# Patient Record
Sex: Female | Born: 1950 | Race: White | Hispanic: No | Marital: Married | Smoking: Never smoker
Health system: Southern US, Community
[De-identification: ages and names within clinical notes are randomized; demographics above are authoritative.]

## PROBLEM LIST (undated history)

## (undated) DIAGNOSIS — E785 Hyperlipidemia, unspecified: Secondary | ICD-10-CM

## (undated) DIAGNOSIS — T7840XA Allergy, unspecified, initial encounter: Secondary | ICD-10-CM

## (undated) DIAGNOSIS — K5790 Diverticulosis of intestine, part unspecified, without perforation or abscess without bleeding: Secondary | ICD-10-CM

## (undated) DIAGNOSIS — K449 Diaphragmatic hernia without obstruction or gangrene: Secondary | ICD-10-CM

## (undated) DIAGNOSIS — S2239XA Fracture of one rib, unspecified side, initial encounter for closed fracture: Secondary | ICD-10-CM

## (undated) DIAGNOSIS — M1990 Unspecified osteoarthritis, unspecified site: Secondary | ICD-10-CM

## (undated) DIAGNOSIS — G8929 Other chronic pain: Secondary | ICD-10-CM

## (undated) DIAGNOSIS — M76899 Other specified enthesopathies of unspecified lower limb, excluding foot: Secondary | ICD-10-CM

## (undated) DIAGNOSIS — Z1211 Encounter for screening for malignant neoplasm of colon: Secondary | ICD-10-CM

## (undated) DIAGNOSIS — F32A Depression, unspecified: Secondary | ICD-10-CM

## (undated) DIAGNOSIS — N952 Postmenopausal atrophic vaginitis: Secondary | ICD-10-CM

## (undated) DIAGNOSIS — D509 Iron deficiency anemia, unspecified: Secondary | ICD-10-CM

## (undated) DIAGNOSIS — K219 Gastro-esophageal reflux disease without esophagitis: Secondary | ICD-10-CM

## (undated) DIAGNOSIS — H9319 Tinnitus, unspecified ear: Secondary | ICD-10-CM

## (undated) DIAGNOSIS — F419 Anxiety disorder, unspecified: Secondary | ICD-10-CM

## (undated) DIAGNOSIS — N83209 Unspecified ovarian cyst, unspecified side: Secondary | ICD-10-CM

## (undated) DIAGNOSIS — S42309A Unspecified fracture of shaft of humerus, unspecified arm, initial encounter for closed fracture: Secondary | ICD-10-CM

## (undated) DIAGNOSIS — K1121 Acute sialoadenitis: Secondary | ICD-10-CM

## (undated) DIAGNOSIS — M810 Age-related osteoporosis without current pathological fracture: Secondary | ICD-10-CM

## (undated) DIAGNOSIS — S42209A Unspecified fracture of upper end of unspecified humerus, initial encounter for closed fracture: Secondary | ICD-10-CM

## (undated) DIAGNOSIS — R519 Headache, unspecified: Secondary | ICD-10-CM

## (undated) DIAGNOSIS — N870 Mild cervical dysplasia: Secondary | ICD-10-CM

## (undated) DIAGNOSIS — R51 Headache: Secondary | ICD-10-CM

## (undated) DIAGNOSIS — F329 Major depressive disorder, single episode, unspecified: Secondary | ICD-10-CM

## (undated) DIAGNOSIS — S83249A Other tear of medial meniscus, current injury, unspecified knee, initial encounter: Secondary | ICD-10-CM

## (undated) DIAGNOSIS — Z124 Encounter for screening for malignant neoplasm of cervix: Secondary | ICD-10-CM

## (undated) DIAGNOSIS — R7303 Prediabetes: Secondary | ICD-10-CM

## (undated) HISTORY — DX: Diaphragmatic hernia without obstruction or gangrene: K44.9

## (undated) HISTORY — DX: Acute sialoadenitis: K11.21

## (undated) HISTORY — PX: COLONOSCOPY: SHX174

## (undated) HISTORY — DX: Postmenopausal atrophic vaginitis: N95.2

## (undated) HISTORY — DX: Unspecified fracture of upper end of unspecified humerus, initial encounter for closed fracture: S42.209A

## (undated) HISTORY — DX: Anxiety disorder, unspecified: F41.9

## (undated) HISTORY — PX: OTHER SURGICAL HISTORY: SHX169

## (undated) HISTORY — DX: Mild cervical dysplasia: N87.0

## (undated) HISTORY — DX: Age-related osteoporosis without current pathological fracture: M81.0

## (undated) HISTORY — DX: Gastro-esophageal reflux disease without esophagitis: K21.9

## (undated) HISTORY — DX: Encounter for screening for malignant neoplasm of colon: Z12.11

## (undated) HISTORY — DX: Iron deficiency anemia, unspecified: D50.9

## (undated) HISTORY — DX: Allergy, unspecified, initial encounter: T78.40XA

## (undated) HISTORY — DX: Hyperlipidemia, unspecified: E78.5

## (undated) HISTORY — DX: Major depressive disorder, single episode, unspecified: F32.9

## (undated) HISTORY — DX: Prediabetes: R73.03

## (undated) HISTORY — DX: Tinnitus, unspecified ear: H93.19

## (undated) HISTORY — DX: Fracture of one rib, unspecified side, initial encounter for closed fracture: S22.39XA

## (undated) HISTORY — DX: Other specified enthesopathies of unspecified lower limb, excluding foot: M76.899

## (undated) HISTORY — DX: Other chronic pain: G89.29

## (undated) HISTORY — DX: Other tear of medial meniscus, current injury, unspecified knee, initial encounter: S83.249A

## (undated) HISTORY — DX: Unspecified ovarian cyst, unspecified side: N83.209

## (undated) HISTORY — DX: Unspecified osteoarthritis, unspecified site: M19.90

## (undated) HISTORY — DX: Encounter for screening for malignant neoplasm of cervix: Z12.4

## (undated) HISTORY — DX: Diverticulosis of intestine, part unspecified, without perforation or abscess without bleeding: K57.90

## (undated) HISTORY — PX: HYSTEROSCOPY: SHX211

## (undated) HISTORY — DX: Depression, unspecified: F32.A

## (undated) HISTORY — DX: Headache: R51

## (undated) HISTORY — PX: MOUTH SURGERY: SHX715

## (undated) HISTORY — DX: Unspecified fracture of shaft of humerus, unspecified arm, initial encounter for closed fracture: S42.309A

## (undated) HISTORY — DX: Headache, unspecified: R51.9

---
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
## 1985-02-12 HISTORY — PX: TUBAL LIGATION: SHX77

## 1987-02-13 HISTORY — PX: CHOLECYSTECTOMY: SHX55

## 1997-08-05 ENCOUNTER — Ambulatory Visit (HOSPITAL_COMMUNITY): Admission: RE | Admit: 1997-08-05 | Discharge: 1997-08-05 | Payer: Self-pay | Admitting: Obstetrics and Gynecology

## 1999-02-13 HISTORY — PX: CARPAL TUNNEL RELEASE: SHX101

## 1999-08-29 ENCOUNTER — Encounter (INDEPENDENT_AMBULATORY_CARE_PROVIDER_SITE_OTHER): Payer: Self-pay | Admitting: Specialist

## 1999-08-29 ENCOUNTER — Other Ambulatory Visit: Admission: RE | Admit: 1999-08-29 | Discharge: 1999-08-29 | Payer: Self-pay | Admitting: Obstetrics and Gynecology

## 1999-12-05 ENCOUNTER — Ambulatory Visit (HOSPITAL_BASED_OUTPATIENT_CLINIC_OR_DEPARTMENT_OTHER): Admission: RE | Admit: 1999-12-05 | Discharge: 1999-12-05 | Payer: Self-pay | Admitting: Orthopedic Surgery

## 2000-10-09 ENCOUNTER — Other Ambulatory Visit: Admission: RE | Admit: 2000-10-09 | Discharge: 2000-10-09 | Payer: Self-pay | Admitting: Obstetrics and Gynecology

## 2000-11-12 ENCOUNTER — Other Ambulatory Visit: Admission: RE | Admit: 2000-11-12 | Discharge: 2000-11-12 | Payer: Self-pay | Admitting: Obstetrics and Gynecology

## 2000-11-14 ENCOUNTER — Other Ambulatory Visit: Admission: RE | Admit: 2000-11-14 | Discharge: 2000-11-14 | Payer: Self-pay | Admitting: Obstetrics and Gynecology

## 2001-02-12 HISTORY — PX: ABDOMINAL SURGERY: SHX537

## 2001-02-12 HISTORY — PX: OOPHORECTOMY: SHX86

## 2001-03-20 ENCOUNTER — Encounter (INDEPENDENT_AMBULATORY_CARE_PROVIDER_SITE_OTHER): Payer: Self-pay | Admitting: Specialist

## 2001-03-21 ENCOUNTER — Inpatient Hospital Stay (HOSPITAL_COMMUNITY): Admission: RE | Admit: 2001-03-21 | Discharge: 2001-03-23 | Payer: Self-pay | Admitting: Obstetrics and Gynecology

## 2001-03-28 ENCOUNTER — Encounter: Payer: Self-pay | Admitting: *Deleted

## 2001-03-28 ENCOUNTER — Inpatient Hospital Stay (HOSPITAL_COMMUNITY): Admission: EM | Admit: 2001-03-28 | Discharge: 2001-04-02 | Payer: Self-pay | Admitting: Emergency Medicine

## 2001-03-29 ENCOUNTER — Encounter: Payer: Self-pay | Admitting: Emergency Medicine

## 2002-05-26 ENCOUNTER — Other Ambulatory Visit: Admission: RE | Admit: 2002-05-26 | Discharge: 2002-05-26 | Payer: Self-pay | Admitting: Obstetrics and Gynecology

## 2002-09-17 ENCOUNTER — Ambulatory Visit (HOSPITAL_BASED_OUTPATIENT_CLINIC_OR_DEPARTMENT_OTHER): Admission: RE | Admit: 2002-09-17 | Discharge: 2002-09-17 | Payer: Self-pay | Admitting: Orthopedic Surgery

## 2003-05-31 ENCOUNTER — Other Ambulatory Visit: Admission: RE | Admit: 2003-05-31 | Discharge: 2003-05-31 | Payer: Self-pay | Admitting: Obstetrics and Gynecology

## 2004-02-24 ENCOUNTER — Ambulatory Visit: Payer: Self-pay | Admitting: Internal Medicine

## 2004-03-03 ENCOUNTER — Ambulatory Visit: Payer: Self-pay | Admitting: Internal Medicine

## 2004-03-08 ENCOUNTER — Ambulatory Visit: Payer: Self-pay | Admitting: Internal Medicine

## 2004-05-31 ENCOUNTER — Other Ambulatory Visit: Admission: RE | Admit: 2004-05-31 | Discharge: 2004-05-31 | Payer: Self-pay | Admitting: Obstetrics and Gynecology

## 2004-11-30 ENCOUNTER — Other Ambulatory Visit: Admission: RE | Admit: 2004-11-30 | Discharge: 2004-11-30 | Payer: Self-pay | Admitting: Obstetrics and Gynecology

## 2005-04-12 ENCOUNTER — Ambulatory Visit: Payer: Self-pay | Admitting: Internal Medicine

## 2005-06-04 ENCOUNTER — Other Ambulatory Visit: Admission: RE | Admit: 2005-06-04 | Discharge: 2005-06-04 | Payer: Self-pay | Admitting: Obstetrics and Gynecology

## 2005-11-14 ENCOUNTER — Other Ambulatory Visit: Admission: RE | Admit: 2005-11-14 | Discharge: 2005-11-14 | Payer: Self-pay | Admitting: Obstetrics and Gynecology

## 2006-01-01 ENCOUNTER — Ambulatory Visit: Payer: Self-pay | Admitting: Internal Medicine

## 2006-01-16 ENCOUNTER — Ambulatory Visit: Payer: Self-pay | Admitting: Internal Medicine

## 2006-01-16 LAB — CONVERTED CEMR LAB
ALT: 37 units/L (ref 0–40)
AST: 30 units/L (ref 0–37)
Albumin: 3.9 g/dL (ref 3.5–5.2)
Alkaline Phosphatase: 69 units/L (ref 39–117)
BUN: 15 mg/dL (ref 6–23)
CO2: 27 meq/L (ref 19–32)
Calcium: 9.4 mg/dL (ref 8.4–10.5)
Chloride: 106 meq/L (ref 96–112)
Chol/HDL Ratio, serum: 3.2
Cholesterol: 205 mg/dL (ref 0–200)
Creatinine, Ser: 1.1 mg/dL (ref 0.4–1.2)
GFR calc non Af Amer: 55 mL/min
Glomerular Filtration Rate, Af Am: 66 mL/min/{1.73_m2}
Glucose, Bld: 98 mg/dL (ref 70–99)
HDL: 63.9 mg/dL (ref >39.0)
LDL DIRECT: 135.2 mg/dL
Potassium: 4 meq/L (ref 3.5–5.1)
Sodium: 142 meq/L (ref 135–145)
Total Bilirubin: 0.6 mg/dL (ref 0.3–1.2)
Total Protein: 6.9 g/dL (ref 6.0–8.3)
Triglyceride fasting, serum: 88 mg/dL (ref 0–149)
VLDL: 18 mg/dL (ref 0–40)

## 2006-03-11 ENCOUNTER — Ambulatory Visit: Payer: Self-pay | Admitting: Internal Medicine

## 2006-07-12 ENCOUNTER — Other Ambulatory Visit: Admission: RE | Admit: 2006-07-12 | Discharge: 2006-07-12 | Payer: Self-pay | Admitting: Obstetrics and Gynecology

## 2006-09-12 ENCOUNTER — Ambulatory Visit: Payer: Self-pay | Admitting: Internal Medicine

## 2006-09-22 LAB — CONVERTED CEMR LAB
ALT: 53 units/L — ABNORMAL HIGH (ref 0–35)
AST: 42 units/L — ABNORMAL HIGH (ref 0–37)
Albumin: 4.1 g/dL (ref 3.5–5.2)
Alkaline Phosphatase: 69 units/L (ref 39–117)
BUN: 12 mg/dL (ref 6–23)
Bilirubin, Direct: 0.1 mg/dL (ref 0.0–0.3)
CO2: 27 meq/L (ref 19–32)
Calcium: 9.3 mg/dL (ref 8.4–10.5)
Chloride: 106 meq/L (ref 96–112)
Cholesterol: 213 mg/dL (ref 0–200)
Creatinine, Ser: 1 mg/dL (ref 0.4–1.2)
Direct LDL: 130.2 mg/dL
GFR calc Af Amer: 74 mL/min
GFR calc non Af Amer: 61 mL/min
Glucose, Bld: 100 mg/dL — ABNORMAL HIGH (ref 70–99)
HDL: 59.1 mg/dL (ref >39.0)
Potassium: 3.7 meq/L (ref 3.5–5.1)
Sodium: 141 meq/L (ref 135–145)
Total Bilirubin: 0.8 mg/dL (ref 0.3–1.2)
Total CHOL/HDL Ratio: 3.6
Total Protein: 7 g/dL (ref 6.0–8.3)
Triglycerides: 104 mg/dL (ref 0–149)
VLDL: 21 mg/dL (ref 0–40)

## 2006-09-23 ENCOUNTER — Encounter (INDEPENDENT_AMBULATORY_CARE_PROVIDER_SITE_OTHER): Payer: Self-pay | Admitting: *Deleted

## 2007-01-13 ENCOUNTER — Other Ambulatory Visit: Admission: RE | Admit: 2007-01-13 | Discharge: 2007-01-13 | Payer: Self-pay | Admitting: Obstetrics and Gynecology

## 2007-06-13 ENCOUNTER — Encounter: Payer: Self-pay | Admitting: Internal Medicine

## 2007-07-14 ENCOUNTER — Other Ambulatory Visit: Admission: RE | Admit: 2007-07-14 | Discharge: 2007-07-14 | Payer: Self-pay | Admitting: Obstetrics and Gynecology

## 2007-11-26 ENCOUNTER — Ambulatory Visit: Payer: Self-pay | Admitting: Internal Medicine

## 2007-11-26 DIAGNOSIS — M25569 Pain in unspecified knee: Secondary | ICD-10-CM | POA: Insufficient documentation

## 2007-11-27 ENCOUNTER — Telehealth (INDEPENDENT_AMBULATORY_CARE_PROVIDER_SITE_OTHER): Payer: Self-pay | Admitting: *Deleted

## 2007-12-01 ENCOUNTER — Encounter (INDEPENDENT_AMBULATORY_CARE_PROVIDER_SITE_OTHER): Payer: Self-pay | Admitting: *Deleted

## 2008-08-18 ENCOUNTER — Ambulatory Visit: Payer: Self-pay | Admitting: Internal Medicine

## 2008-08-18 DIAGNOSIS — F329 Major depressive disorder, single episode, unspecified: Secondary | ICD-10-CM

## 2008-08-18 DIAGNOSIS — F3289 Other specified depressive episodes: Secondary | ICD-10-CM | POA: Insufficient documentation

## 2008-08-24 ENCOUNTER — Ambulatory Visit: Payer: Self-pay | Admitting: Obstetrics and Gynecology

## 2008-08-24 ENCOUNTER — Other Ambulatory Visit: Admission: RE | Admit: 2008-08-24 | Discharge: 2008-08-24 | Payer: Self-pay | Admitting: Obstetrics and Gynecology

## 2008-08-24 ENCOUNTER — Encounter: Payer: Self-pay | Admitting: Obstetrics and Gynecology

## 2008-08-29 LAB — CONVERTED CEMR LAB
ALT: 29 units/L (ref 0–35)
AST: 24 units/L (ref 0–37)
Albumin: 3.7 g/dL (ref 3.5–5.2)
Alkaline Phosphatase: 61 units/L (ref 39–117)
Basophils Absolute: 0.2 10*3/uL — ABNORMAL HIGH (ref 0.0–0.1)
Basophils Relative: 3.1 % — ABNORMAL HIGH (ref 0.0–3.0)
Bilirubin, Direct: 0 mg/dL (ref 0.0–0.3)
Eosinophils Absolute: 0.2 10*3/uL (ref 0.0–0.7)
Eosinophils Relative: 2.9 % (ref 0.0–5.0)
HCT: 35.7 % — ABNORMAL LOW (ref 36.0–46.0)
Hemoglobin: 12.1 g/dL (ref 12.0–15.0)
Lymphocytes Relative: 31 % (ref 12.0–46.0)
Lymphs Abs: 1.8 10*3/uL (ref 0.7–4.0)
MCHC: 33.9 g/dL (ref 30.0–36.0)
MCV: 88.8 fL (ref 78.0–100.0)
Monocytes Absolute: 0.5 10*3/uL (ref 0.1–1.0)
Monocytes Relative: 9.2 % (ref 3.0–12.0)
Neutro Abs: 3 10*3/uL (ref 1.4–7.7)
Neutrophils Relative %: 53.8 % (ref 43.0–77.0)
Platelets: 189 10*3/uL (ref 150.0–400.0)
RBC: 4.02 M/uL (ref 3.87–5.11)
RDW: 13.6 % (ref 11.5–14.6)
TSH: 1.09 microintl units/mL (ref 0.35–5.50)
Total Bilirubin: 0.7 mg/dL (ref 0.3–1.2)
Total Protein: 6.7 g/dL (ref 6.0–8.3)
WBC: 5.7 10*3/uL (ref 4.5–10.5)

## 2008-08-31 ENCOUNTER — Telehealth: Payer: Self-pay | Admitting: Internal Medicine

## 2008-08-31 ENCOUNTER — Ambulatory Visit: Payer: Self-pay | Admitting: Obstetrics and Gynecology

## 2008-08-31 ENCOUNTER — Encounter (INDEPENDENT_AMBULATORY_CARE_PROVIDER_SITE_OTHER): Payer: Self-pay | Admitting: *Deleted

## 2008-09-03 ENCOUNTER — Encounter: Payer: Self-pay | Admitting: Internal Medicine

## 2008-09-06 ENCOUNTER — Encounter: Payer: Self-pay | Admitting: Internal Medicine

## 2008-09-24 ENCOUNTER — Encounter: Payer: Self-pay | Admitting: Family Medicine

## 2008-09-29 ENCOUNTER — Ambulatory Visit: Payer: Self-pay | Admitting: Internal Medicine

## 2008-10-08 LAB — CONVERTED CEMR LAB
Basophils Absolute: 0 10*3/uL (ref 0.0–0.1)
Basophils Relative: 0.7 % (ref 0.0–3.0)
Eosinophils Absolute: 0.1 10*3/uL (ref 0.0–0.7)
Eosinophils Relative: 2.2 % (ref 0.0–5.0)
HCT: 38 % (ref 36.0–46.0)
Hemoglobin: 12.6 g/dL (ref 12.0–15.0)
Iron: 77 ug/dL (ref 42–145)
Lymphocytes Relative: 37.8 % (ref 12.0–46.0)
Lymphs Abs: 2.2 10*3/uL (ref 0.7–4.0)
MCHC: 33.1 g/dL (ref 30.0–36.0)
MCV: 89.8 fL (ref 78.0–100.0)
Monocytes Absolute: 0.5 10*3/uL (ref 0.1–1.0)
Monocytes Relative: 8.3 % (ref 3.0–12.0)
Neutro Abs: 3.1 10*3/uL (ref 1.4–7.7)
Neutrophils Relative %: 51 % (ref 43.0–77.0)
Platelets: 164 10*3/uL (ref 150.0–400.0)
RBC: 4.23 M/uL (ref 3.87–5.11)
RDW: 12.8 % (ref 11.5–14.6)
Saturation Ratios: 17.5 % — ABNORMAL LOW (ref 20.0–50.0)
Transferrin: 314.7 mg/dL (ref 212.0–360.0)
Vitamin B-12: 431 pg/mL (ref 211–911)
WBC: 5.9 10*3/uL (ref 4.5–10.5)

## 2008-10-11 ENCOUNTER — Encounter (INDEPENDENT_AMBULATORY_CARE_PROVIDER_SITE_OTHER): Payer: Self-pay | Admitting: *Deleted

## 2008-10-13 ENCOUNTER — Ambulatory Visit: Payer: Self-pay | Admitting: Obstetrics and Gynecology

## 2008-11-18 ENCOUNTER — Ambulatory Visit: Payer: Self-pay | Admitting: Internal Medicine

## 2008-12-14 ENCOUNTER — Telehealth (INDEPENDENT_AMBULATORY_CARE_PROVIDER_SITE_OTHER): Payer: Self-pay | Admitting: *Deleted

## 2008-12-21 ENCOUNTER — Encounter: Payer: Self-pay | Admitting: Internal Medicine

## 2009-01-28 ENCOUNTER — Ambulatory Visit: Payer: Self-pay | Admitting: Internal Medicine

## 2009-01-28 DIAGNOSIS — E785 Hyperlipidemia, unspecified: Secondary | ICD-10-CM | POA: Insufficient documentation

## 2009-01-31 ENCOUNTER — Ambulatory Visit: Payer: Self-pay | Admitting: Internal Medicine

## 2009-01-31 ENCOUNTER — Encounter (INDEPENDENT_AMBULATORY_CARE_PROVIDER_SITE_OTHER): Payer: Self-pay | Admitting: *Deleted

## 2009-02-10 ENCOUNTER — Encounter (INDEPENDENT_AMBULATORY_CARE_PROVIDER_SITE_OTHER): Payer: Self-pay | Admitting: *Deleted

## 2009-02-15 ENCOUNTER — Ambulatory Visit: Payer: Self-pay | Admitting: Gastroenterology

## 2009-03-10 ENCOUNTER — Ambulatory Visit: Payer: Self-pay | Admitting: Gastroenterology

## 2009-09-19 ENCOUNTER — Ambulatory Visit: Payer: Self-pay | Admitting: Obstetrics and Gynecology

## 2009-09-19 ENCOUNTER — Other Ambulatory Visit: Admission: RE | Admit: 2009-09-19 | Discharge: 2009-09-19 | Payer: Self-pay | Admitting: Obstetrics and Gynecology

## 2009-11-08 ENCOUNTER — Ambulatory Visit: Payer: Self-pay | Admitting: Internal Medicine

## 2009-11-15 ENCOUNTER — Ambulatory Visit: Payer: Self-pay | Admitting: Internal Medicine

## 2010-03-14 NOTE — Assessment & Plan Note (Signed)
Summary: SHINGLES VACCINATION/RH........  Nurse Visit   Allergies: 1)  ! Prednisone  Immunizations Administered:  Zostavax # 1:    Vaccine Type: Zostavax    Site: Left Arm    Mfr: Merck    Dose: 0.18mL    Route: Bladen    Given by: Shonna Chock CMA    Exp. Date: 09/30/2010    Lot #: [id number]    VIS given: 11/24/04 given November 15, 2009.  Orders Added: 1)  Zoster (Shingles) Vaccine Live [90736] 2)  Admin 1st Vaccine [phone]

## 2010-03-14 NOTE — Assessment & Plan Note (Signed)
Summary: FLU SHOT,HOPPER PT/RH.......  Nurse Visit  CC: Flu shot./kb   Allergies: 1)  ! Prednisone  Orders Added: 1)  Admin 1st Vaccine [90471] 2)  Flu Vaccine 30yrs + [16109]        Flu Vaccine Consent Questions     Do you have a history of severe allergic reactions to this vaccine? no    Any prior history of allergic reactions to egg and/or gelatin? no    Do you have a sensitivity to the preservative Thimersol? no    Do you have a past history of Guillan-Barre Syndrome? no    Do you currently have an acute febrile illness? no    Have you ever had a severe reaction to latex? no    Vaccine information given and explained to patient? yes    Are you currently pregnant? no    Lot Number:[id number]   Exp Date:08/12/2010   Site Given  Left Deltoid IMu Lucious Groves CMA  November 08, 2009 9:06 AM

## 2010-03-14 NOTE — Procedures (Signed)
Summary: Colonoscopy  Patient: [name] Note: All result statuses are Final unless otherwise noted.  Tests: (1) Colonoscopy (COL)   COL Colonoscopy           DONE     [facility]     [address]     [place], [place]  [address]           COLONOSCOPY PROCEDURE REPORT           PATIENT:  Suzanne, Spencer  MR#:  [id number]     BIRTHDATE:  Mar 28, 1950, 58 yrs. old  GENDER:  female           ENDOSCOPIST:  Judie Petit T. Russella Dar, MD, [facility]           PROCEDURE DATE:  03/10/2009     PROCEDURE:  Colonoscopy 52841     ASA CLASS:  Class II     INDICATIONS:  1) Routine Risk Screening           MEDICATIONS:   Fentanyl 100 mcg IV, Versed 8 mg IV           DESCRIPTION OF PROCEDURE:   After the risks benefits and     alternatives of the procedure were thoroughly explained, informed     consent was obtained.  Digital rectal exam was performed and     revealed no abnormalities.   The LB PCF-[id number] endoscope     was introduced through the anus and advanced to the cecum, which     was identified by both the appendix and ileocecal valve, without     limitations.  The quality of the prep was good, using MoviPrep.     The instrument was then slowly withdrawn as the colon was fully     examined.     <<PROCEDUREIMAGES>>           FINDINGS:  A normal appearing cecum, ileocecal valve, and     appendiceal orifice were identified. The ascending, hepatic     flexure, transverse, splenic flexure, descending, sigmoid colon,     and rectum appeared unremarkable. Retroflexed views in the rectum     revealed no abnormalities. The time to cecum =  2.5  minutes. The     scope was then withdrawn (time =  10.25  min) from the patient and     the procedure completed.           COMPLICATIONS:  None           ENDOSCOPIC IMPRESSION:     1) Normal colon           RECOMMENDATIONS:     1) Continue current colorectal screening recommendations for     "routine risk" patients with a repeat  colonoscopy in 10 years.           Venita Lick. Russella Dar, MD, [facility]           CC: Pecola Lawless, MD           n.     Rosalie DoctorVenita Lick. Latroy Gaymon at 03/10/2009 10:12 AM           Clemencia Course, [id number]  Note: An exclamation mark (!) indicates a result that was not dispersed into the flowsheet. Document Creation Date: 03/10/2009 10:12 AM _______________________________________________________________________  (1) Order result status: Final Collection or observation date-time: 03/10/2009 10:09 Requested date-time:  Receipt date-time:  Reported date-time:  Referring Physician:   Ordering Physician: Claudette Head [phone]) Specimen Source:  Source: Launa Grill Order Number: [id number] Lab site:   Appended Document: Colonoscopy     Colonoscopy  Procedure date:  03/10/2009  Findings:      Location:  [facility].    Procedures Next Due Date:    Colonoscopy: 03/2019

## 2010-03-14 NOTE — Miscellaneous (Signed)
Summary: SCREENING COLON--CH  Clinical Lists Changes  Medications: Added new medication of MOVIPREP 100 GM  SOLR (PEG-KCL-NACL-NASULF-NA ASC-C) As directed - Signed Rx of MOVIPREP 100 GM  SOLR (PEG-KCL-NACL-NASULF-NA ASC-C) As directed;  #1 x 0;  Signed;  Entered by: Clide Cliff RN;  Authorized by: Meryl Dare MD [place];  Method used: [facility] [id number]*, [address] [place], [place]  [address], Ph: [phone] or [phone], Fax: [phone] Observations: Added new observation of ALLERGY REV: Done (02/15/2009 10:49)    Prescriptions: MOVIPREP 100 GM  SOLR (PEG-KCL-NACL-NASULF-NA ASC-C) As directed  #1 x 0   Entered by:   Clide Cliff RN   Authorized by:   Meryl Dare MD [facility]   Signed by:   Clide Cliff RN on 02/15/2009   Method used:   [facility]ronically to        [facility] [id number] (retail)       [address] [place], [place]  [address]       Ph: [phone] or [phone]       Fax: [phone]   RxID:   [id number]

## 2010-04-05 ENCOUNTER — Encounter: Payer: Self-pay | Admitting: Internal Medicine

## 2010-05-16 ENCOUNTER — Encounter: Payer: Self-pay | Admitting: Internal Medicine

## 2010-05-16 ENCOUNTER — Ambulatory Visit (INDEPENDENT_AMBULATORY_CARE_PROVIDER_SITE_OTHER): Payer: 59 | Admitting: Internal Medicine

## 2010-05-16 DIAGNOSIS — Z0000 Encounter for general adult medical examination without abnormal findings: Secondary | ICD-10-CM

## 2010-05-16 DIAGNOSIS — M899 Disorder of bone, unspecified: Secondary | ICD-10-CM

## 2010-05-16 DIAGNOSIS — E785 Hyperlipidemia, unspecified: Secondary | ICD-10-CM

## 2010-05-16 DIAGNOSIS — M8580 Other specified disorders of bone density and structure, unspecified site: Secondary | ICD-10-CM

## 2010-05-16 DIAGNOSIS — Z23 Encounter for immunization: Secondary | ICD-10-CM

## 2010-05-16 LAB — CBC WITH DIFFERENTIAL/PLATELET
Basophils Relative: 1 % (ref 0.0–3.0)
Eosinophils Relative: 2 % (ref 0.0–5.0)
Lymphocytes Relative: 36.5 % (ref 12.0–46.0)
MCV: 90.3 fl (ref 78.0–100.0)
Monocytes Absolute: 0.4 10*3/uL (ref 0.1–1.0)
Monocytes Relative: 6.9 % (ref 3.0–12.0)
Neutrophils Relative %: 53.6 % (ref 43.0–77.0)
RBC: 4.07 Mil/uL (ref 3.87–5.11)
WBC: 5.7 10*3/uL (ref 4.5–10.5)

## 2010-05-16 MED ORDER — TETANUS-DIPHTH-ACELL PERTUSSIS 5-2.5-18.5 LF-MCG/0.5 IM SUSP
0.5000 mL | Freq: Once | INTRAMUSCULAR | Status: AC
  Administered 2010-05-16: 0.5 mL via INTRAMUSCULAR

## 2010-05-16 NOTE — Progress Notes (Signed)
Addended by: Doristine Devoid on: 05/16/2010 10:43 AM   Modules accepted: Orders

## 2010-05-16 NOTE — Progress Notes (Signed)
  Subjective:    Patient ID: Suzanne Spencer, female    DOB: Feb 03, 1951, 60 y.o.   MRN: [id number]  HPI She is here for physical examination; she has no active health issues. She does have questions about nutritional supplements, specifically omega-3 fatty acids.    Review of Systems Patient reports no vision/ hearing  changes, adenopathy,fever, persistant / recurrent hoarseness , swallowing issues, chest pain,palpitations,edema,persistant /recurrent cough, hemoptysis, dyspnea( rest/ exertional/paroxysmal nocturnal), gastrointestinal bleeding(melena, rectal bleeding), abdominal pain, significant heartburn or bowel changes,GU symptoms(dysuria, hematuria,pyuria, incontinence) ), Gyn symptoms(abnormal  bleeding , pain),  syncope, focal weakness, memory loss,numbness & tingling, skin/hair /nail changes,abnormal bruising or bleeding, anxiety,or significant  Depression ( on Pristiq).   She is exercising 6 days a week ridding stationary bike for @  least 30 minutes. This has resulted in a 16 pound weight loss.     Objective:   Physical Exam Normocephalic; healthy appearing.Eye - Pupils Equal Round Reactive to light, Extraocular movements intact, Fundi without hemorrhage or visible lesions, Conjunctiva without redness or discharge . Slight ptosis OS; some eyebrow loss laterally.Ears:  External ear exam shows no significant lesions or deformities.  Otoscopic examination reveals clear canals, tympanic membranes are intact bilaterally without bulging, retraction, inflammation or discharge. Hearing is grossly normal bilaterally.Neck:  No deformities, thyromegaly, masses, or tenderness noted.   Supple with full range of motion without pain. Heart:  Normal rate and regular rhythm. S1 and S2 normal without gallop, murmur, click, rub or other extra sounds.                                                                                                      Lungs:Chest clear to auscultation; no wheezes, rhonchi,rales ,or  rubs present.No increased work of breathing. Abdomen: bowel sounds normal, soft and non-tender without masses, organomegaly or hernias noted.  No guarding or rebound Extremities:  No cyanosis, edema, or deformity noted with good range of motion of all major joints.   Minor DIP OA ; crepitus of knees.Neurologic exam :DTRs ,Strength equal & normal in upper & lower extremities.Psych:  Cognition and judgment appear intact. Alert, communicative  and cooperative with normal attention span and concentration.  Skin:  Intact without suspicious lesions or rashes         Assessment & Plan:  #1 comprehensive physical exam, no active issues noted  #2 hyperlipidemia, past medical history of  Plan: Fasting labs(V70.0)

## 2010-05-17 LAB — HEPATIC FUNCTION PANEL
ALT: 17 U/L (ref 0–35)
AST: 27 U/L (ref 0–37)
Alkaline Phosphatase: 86 U/L (ref 39–117)
Bilirubin, Direct: 0 mg/dL (ref 0.0–0.3)
Total Bilirubin: 0.4 mg/dL (ref 0.3–1.2)

## 2010-05-17 LAB — BASIC METABOLIC PANEL
CO2: 27 mEq/L (ref 19–32)
Calcium: 9.2 mg/dL (ref 8.4–10.5)
Glucose, Bld: 87 mg/dL (ref 70–99)
Sodium: 143 mEq/L (ref 135–145)

## 2010-05-17 LAB — LDL CHOLESTEROL, DIRECT: Direct LDL: 119.1 mg/dL

## 2010-05-17 LAB — LIPID PANEL
HDL: 68.3 mg/dL (ref >39.00)
Total CHOL/HDL Ratio: 3

## 2010-06-30 NOTE — Op Note (Signed)
[place]  Patient:    Suzanne Spencer, Suzanne Spencer                    MRN: [id number] Proc. Date: 12/05/99 Adm. Date:  [id number] Attending:  Ronne Binning CC:         Nicki Reaper, M.D.   Operative Report  PREOPERATIVE DIAGNOSIS:  Carpal tunnel syndrome right hand.  POSTOPERATIVE DIAGNOSIS:  Carpal tunnel syndrome right hand.  OPERATION PERFORMED:  Decompression of right median nerve.  SURGEON:  Nicki Reaper, M.D.  ANESTHESIA:  Forearm based IV regional.  ANESTHESIOLOGIST:  Dr. Krista Blue.  INDICATIONS FOR PROCEDURE:  The patient is a 60 year old female with a history of carpal tunnel syndrome EMG nerve conductions positive which has not responded to conservative treatment.  DESCRIPTION OF PROCEDURE:  The patient was brought to the operating room where a forearm based IV regional anesthetic was carried out without difficulty. She was prepped and draped using DuraPrep in a supine position with the right arm free.  A longitudinal incision was made in the palm and carried down through the subcutaneous tissue.  Bleeders were electrocauterized.  The palmar fascia was split.  The superficial palmar arch identified.  The flexor tendon to the ring and little finger identified to the ulnar side of the median nerve.  The carpal retinaculum was incised with sharp dissection.  A right angle and Sewell retractor were placed between skin and forearm fascia.  The fascia was released for approximately 3 cm proximal to the wrist crease under direct vision.  The canal was explored.  No further lesions were identified. The wound was irrigated.  The skin was closed with interrupted 5-0 nylon sutures.  A sterile compressive dressing and splint was applied.  The patient tolerated the procedure well and was taken to the recovery room for observation in satisfactory condition.  She is discharged home to return to the [facility] in one week and on  Vicodin and Keflex. DD:  12/05/99 TD:  12/05/99 Job: [id number]

## 2010-06-30 NOTE — Discharge Summary (Signed)
[facility]  Patient:    Suzanne Spencer, Suzanne Spencer Visit Number: [id number] MRN: [id number]          Service Type: GYN Location: 9300 [id number] 01 Attending Physician:  Sharon Mt Dictated by:   Rande Brunt. Eda Paschal, M.D. Admit Date:  03/20/2001 Discharge Date: 03/23/2001                             Discharge Summary  SUMMARY:                      The patient is a 60 year old female who was taken to the operating room because of a complex left adnexal mass and left lower quadrant pain. Initially, she was laparoscoped. Findings were pelvic adhesive disease and a ovarian cyst of the left ovary. As the adhesions were taken down and the left ovary was removed, it was found that the ovary had actually grown into the sigmoid colon and in dissecting the entire ovary free, the serosa of the sigmoid colon had to be entered. There was some concern that what we were seen entered was a ruptured diverticulum rather than an ovary adherent into the wall of the sigmoid, and it was felt that if this was a ruptured diverticulum and we did not do a laparotomy and close the bowel properly, the patient would have postoperative peritonitis. Therefore, at this point of the surgery, even though the adnexa had almost been completely removed, it was felt better to proceed with laparotomy. A Pfannenstiel incision was made. Laparotomy was performed. The rest of the ovarian cyst, which was in the serosa of the colon, was removed. Frozen section was obtained and this confirmed a benign hemorrhagic cyst of the ovary without any evidence of significant bowel wall involvement. The serosa of the bowel was closed and at this point, the patient was ready to be closed when a white lesion on the small bowel, very high up in the abdomen, was noted. Dr. Dominga Ferry was called in to ascertain what this was and he said it was a lacteal and it did not resection.  Postoperatively, the patient  had voiding trouble. Her Foley had to be reinserted. She also had a significant ileus. However, by the third postoperative day, she was voiding well and passing gas and was ready for discharge.  DISCHARGE MEDICATIONS:        Percocet for pain relief.  DISCHARGE DIET:               Soft to advance as tolerated.  DISCHARGE ACTIVITY:           Ambulatory.  WOUND CARE:                   Routine.  COD:                          Improved.  FINAL PATHOLOGY REPORT:       Not available at time of dictation.  DISCHARGE DIAGNOSES:          1. Left lower quadrant pain.                               2. Hemorrhagic ovarian cyst.  3. Severe pelvic adhesive disease.  OPERATION:                    Diagnostic laparoscopy with left salpingo-oophorectomy followed by laparotomy with closure of serosa of sigmoid colon. Dictated by:   Rande Brunt. Eda Paschal, M.D. Attending Physician:  Sharon Mt DD:  03/23/01 TD:  03/23/01 Job: [phone] [id number]

## 2010-06-30 NOTE — H&P (Signed)
[facility]  Patient:    Suzanne Spencer, Suzanne Spencer Visit Number: [id number] MRN: [id number]          Service Type: MED Location: 1E 0102 01 Attending Physician:  Shelba Flake Dictated by:   Almedia Balls. Randell Patient, M.D. Admit Date:  03/28/2001   CC:         Reuel Boom L. Eda Paschal, M.D.   History and Physical  CHIEF COMPLAINT:  Pain after surgery.  HISTORY OF PRESENT ILLNESS:  The patient is a 60 year old who underwent laparoscopy, exploratory laparotomy, lysis of adhesions, left salpingo-oophorectomy, oversewing of serosal tear in sigmoid on March 20, 2001, by Dr. Edyth Gunnels of [facility] [place].  The patient was discharged on March 23, 2001, and since that time has continued to have lower abdominal pain with temperature elevation.  This has progressively worsened, to the point where she had called the office several times over the past several days and was finally advised to come to the [facility] Emergency Room where she would be seen by a physician.  She presented in the emergency room this evening at approximately 1800 hours and was evaluated by the emergency department physician initially.  The GYN physician was called at 2200 hours, and the patient was evaluated at 2220.  She was found to have extreme abdominal and pelvic pain and temperature elevation to 101 degrees, as well as white blood count of 15,600 with 86% segmented neutrophils.  She is admitted at this time for IV therapy and further evaluation.  PAST MEDICAL HISTORY: 1. The above-noted surgery. 2. She has had D&C in the past. 3. Cholecystectomy in 1989, without incident.  FAMILY HISTORY:  Essentially noncontributory.  REVIEW OF SYSTEMS:  HEENT:  Wears glasses.  CARDIORESPIRATORY:  Negative. GASTROINTESTINAL:  Has had fairly normal bowel activity.  States that she has passed gas and had some bowel movement over the past several days. GENITOURINARY:  Includes the  above-noted surgery for ovarian cysts and adhesions.  She denies any urinary tract symptoms.  NEUROMUSCULAR:  Negative.  PHYSICAL EXAMINATION:  VITAL SIGNS:  Blood pressure 153/71, pulse 130, respirations 22, temperature 101.  GENERAL:  Well-developed white female in moderate distress, somewhat diaphoretic.  HEENT:  Within normal limits.  NECK:  Supple.  Without masses, adenopathy, or bruits.  HEART:  Regular rate and rhythm.  Without murmurs.  LUNGS:  Clear to P&A.  BREASTS:  Without masses according to previous exam.  ABDOMEN:  Distended.  Minimally soft and not rigid, with tenderness in all quadrants and slight rebound in all quadrants.  Bowel sounds are diminished to absent.  PELVIC:  Some tenderness on motion.  There is a lower abdominal incision and a transverse incision as well as an umbilical incision, and the patient is tender in these areas as well.  EXTREMITIES:  Within normal limits.  SKIN:  Without suspicious lesions.  CENTRAL NERVOUS SYSTEM:  Grossly intact.  IMPRESSION: 1. Pain. 2. Elevated temperature. 3. Status post exploratory laparotomy, left salpingo-oophorectomy, extensive    lysis of adhesions, oversewing of bowel laceration.  PLAN:  Admit for IV fluids and further evaluation. Dictated by:   Almedia Balls Randell Patient, M.D. Attending Physician:  Shelba Flake DD:  03/28/01 TD:  03/29/01 Job: [id number]

## 2010-06-30 NOTE — Op Note (Signed)
[facility]  Patient:    Suzanne Spencer, Suzanne Spencer Visit Number: [id number] MRN: [id number]          Service Type: DSU Location: 9300 [id number] 01 Attending Physician:  Sharon Mt Dictated by:   Rande Brunt. Eda Paschal, M.D. Proc. Date: 03/20/01 Admit Date:  03/20/2001                             Operative Report  PREOPERATIVE DIAGNOSES:       Left lower quadrant pain, left adnexal mass.  POSTOPERATIVE DIAGNOSES:      Pelvic adhesive disease with benign ovarian cysts of left ovary.  NAME OF OPERATION:            Diagnostic laparoscopy followed by exploratory laparotomy with left salpingo-oophorectomy.  SURGEON:                      Daniel L. Eda Paschal, M.D.  FIRST ASSISTANT:              Juan H. Lily Peer, M.D.  INTRAOPERATIVE CONSULT:       Luisa Hart L. Lurene Shadow, M.D.  FINDINGS:                     At the time both of laparoscopy and of laparotomy there were dense adhesions involving the patients left adnexa where she had been having her pain.  There was bowel that was densely adherent to the adnexa.  Once it was freed up the ovary was enlarged with several ovarian cysts and one of the was densely adherent to the sigmoid colon in such a fashion that as it was freed up it actually had dissected through the serosa of the sigmoid colon.  The uterus itself was normal.  Right ovary and fallopian tube were normal.  Ileocecal junction was identified.  Appendix was normal.  There were some dense adhesions in the right upper quadrant, some of them involving small bowel from patients previous laparoscopy.  There was an area in her small bowel probably in the jejunum where there was a white smooth lesion covering a large portion of the small bowel in that area.  When Dr. Lurene Shadow was called to evaluate he said it was a lacteal.  Patients preoperative ultrasound had shown multiple complex cysts of the left ovary, some of them I believe were functional cysts and  some of them I believe were the fact that bowel was so densely adherent to the area.  The purpose of the surgery was both for pelvic pain that was persistent in the left lower quadrant as well as to be sure these were benign cysts which indeed they were.  PROCEDURE:                    After adequate general endotracheal anesthesia the patient was placed in the dorsal lithotomy position and prepped and draped in the usual sterile manner.  The bladder was emptied with a Robinson catheter.  A Hulka tenaculum was secured to the uterus.  Pneumoperitoneum was created subumbilically with a Verhey needle and then a 10 mm trocar was placed subumbilically and through that the operating laparoscope was placed.  Two 5 mm ports were placed for additional instrumentation.  One was suprapubically and one was in the left lower quadrant.  The first thing that was done was peritoneal washings were obtained.  After peritoneal washings were obtained using a scissors through  the operating channel of the laparoscope the adhesions which held the bowel to the left adnexa were freed up.  Unipolar cautery was used whenever appropriate and was used far from the bowel surface. 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  The next step was to do a left salpingo-oophorectomy.  The infundibulopelvic ligament on the left was bipolar cut after the ureter had been identified.  The attachments of the adnexa to the uterus were bipolar cut including the utero-ovarian ligament, the round ligament, and the fallopian tube.  Using sharp scissors, again with unipolar cautery, additional attachments of adhesive disease were lysed and at this point the adnexa was almost completely free.  It was still adherent at the site of an ovarian cyst to the sigmoid colon.  As traction was placed by the assistant an area  came out of this area but although it looked like an ovarian cyst, the contents of, also could have been a diverticulum as it entered the serosa of the sigmoid. At this point both the surgeon and the assistant were uncomfortable just finishing the surgery laparoscopically with concern that the bowel serosa at least needed to be reapproximated and that also we needed to ascertain for sure that what we were seeing was all adnexal and that it was not a diverticulum that went through and through into the bowel lumen.  At this point all the laparoscopic equipment was removed and the patient was set up for a laparotomy.  A Pfannenstiel incision was made.  The fascia was opened transversely.  The perineum was entered vertically.  Subcutaneous bleeders were clamped and Bovied as encountered.  The rectus muscles were partially dissected free with the Bovie to allow [place] to see better as where the ovary was adherent was in a very awkward spot in the cul-de-sac.  A self retaining retractor was placed.  The pelvis was packed to see the area and then being very careful the entire area was removed.  It was sent for frozen section.  It was all benign.  It appeared to be an ovarian cyst that had dissected into the serosa of the sigmoid rather than a ruptured diverticulum.  After this had been ascertained the serosa of the bowel was closed with interrupted 4-0 silk on a GI needle which did not only stop the bleeding, but completely approximated that area.  At this point we were getting ready to close when an area in the small bowel was seen that was white, smooth, and slightly irregular.  There was some concern that this could be a cautery injury, although it was nowhere near where the surgery had taken place, yet it still was ______.  As a result of this Dr. Lurene Shadow was called.  It looked at it and said it was a lacteal and that it did not require any surgery and that it was clearly not a cautery burn.  The  small bowel had been run during this part of the procedure and nothing else untoward was seen other than she did have some small bowel adherent in the right upper quadrant from a previous  cholecystectomy.  Two sponge, needle, instrument counts were correct.  The peritoneum was closed with a running 0 Vicryl.  Subfascial and suprafascial spaces were copiously irrigated with Ringers lactate.  The fascia was closed with 2-0 Vicryl running and the skin was closed  with staples.  Estimated blood loss from entire procedure was 100 cc with none replaced.  Patient tolerated procedure well and left the operating room in satisfactory condition, draining clear urine from her Foley catheter.  Please note the Foley catheter had been inserted when a decision was made to go from laparoscopy to laparotomy. Dictated by:   Rande Brunt. Eda Paschal, M.D. Attending Physician:  Sharon Mt DD:  03/20/01 TD:  03/21/01 Job: [id number]

## 2010-06-30 NOTE — Discharge Summary (Signed)
[facility]  Patient:    Suzanne Spencer, WEINFELD Visit Number: [id number] MRN: [id number]          Service Type: MED Location: [phone] Attending Physician:  Sharon Mt Dictated by:   Rande Brunt. Eda Paschal, M.D. Admit Date:  03/28/2001 Discharge Date: 04/02/2001                             Discharge Summary  HISTORY OF PRESENT ILLNESS:  The patient is a 60 year old female who several weeks prior to admission had undergone laparoscopy followed by laparotomy at [facility] with findings of dense pelvic adhesive disease, persistent complex left ovarian cyst, and dense adhesive disease adhering the ovary to the sigmoid colon.  Once again, she had undergone a laparoscopy followed by a laparotomy and a left S&O.  She came to the emergency room on March 28, 2001, with a history of increasing pain, discomfort, and fever, and was admitted because of the above.  HOSPITAL COURSE:  A CT scan was done which revealed a fluid collection which was felt to be subfascial.  Because of the extensiveness of this collection, it was felt that surgical drainage was necessary, and she was taken to the operating room by Dr. Randell Patient.  The incision was opened and was completely drained.  It was not felt possible to close the incision at that point, plus there was concern that the infection would reoccur, so the incision was left opened and packed.  She was treated with IV antibiotics.  As her hospitalization continued, the IV antibiotics were switched to oral antibiotics, and she continued with wet-to-dry packing of the incision.  By April 02, 2001, she had remained afebrile.  She had been on her feet significantly without any evidence of dehissence.  Dr. Eda Paschal obtained a phone consult from Dr. Darnell Level, who did not feel a secondary closure at this point would be appropriate, and therefore she was discharged home to continue with a 7 day course of Keflex 500  mg b.i.d., and to have home nursing come daily for dressing changes.  The dressing was going to continue to be packed in the incision with wet-to-dries using Bug juice.  FOLLOWUP:  Dr. Eda Paschal in the office in one week.  ACTIVITY:  She was to do absolutely no lifting.  DISCHARGE MEDICATIONS:  Doreatha Martin her antibiotics.  CONDITION ON DISCHARGE:  Improved.  DISCHARGE DIAGNOSIS:  Postoperative wound infection due to a fluid collection, possible hematoma.  OPERATIONS:  Incision and drainage of subfascial abscess. Dictated by:   Rande Brunt. Eda Paschal, M.D. Attending Physician:  Sharon Mt DD:  04/11/01 TD:  04/11/01 Job: [id number]

## 2010-06-30 NOTE — Op Note (Signed)
   NAME:  Suzanne Spencer, Suzanne Spencer                       ACCOUNT NO.:  [id number]   MEDICAL RECORD NO.:  [id number]                   PATIENT TYPE:  AMB   LOCATION:  DSC                                  FACILITY:  [facility]   PHYSICIAN:  Cindee Salt, M.D.                    DATE OF BIRTH:  10-19-1950   DATE OF PROCEDURE:  09/17/2002  DATE OF DISCHARGE:                                 OPERATIVE REPORT   PREOPERATIVE DIAGNOSIS:  Carpal tunnel syndrome left hand.   POSTOPERATIVE DIAGNOSIS:  Carpal tunnel syndrome left hand.   OPERATION:  Decompression left median nerve.   SURGEON:  Cindee Salt, M.D.   ASSISTANT:  None.   ANESTHESIA:  Forearm based IV regional.   INDICATIONS FOR PROCEDURE:  The patient is a 60 year old female with a  history of bilateral carpal tunnel syndrome, EMG nerve conductions positive  which has not responded to conservative treatments.  She has undergone right  carpal tunnel release and she is admitted now for release of her left.   DESCRIPTION OF PROCEDURE:  The patient is brought to the operating room  where a forearm based IV regional anesthetic was carried out without  difficulty.  She was prepped and draped using Betadine with DuraPrep in a  supine position with the left arm free.  A longitudinal incision was made in  the palm and carried down through subcutaneous tissue.  Bleeders were  electrocauterized.  Palmar fascia was split.  Superficial palmar arch was  identified.  The flexor tendon to the ring and little finger identified to  the ulnar side of the median nerve.  The carpal retinaculum was incised with  sharp dissection.  A right angle and Sewell retractor were placed between  the skin and forearm fascia.  The fascia was released for approximately 3 cm  proximal to the wrist crease under direct vision.  The canal was explored.  No further lesions were identified.  The wound was irrigated. The skin was  closed with interrupted 5-0 nylon sutures.   Tenosynovial tissue was markedly  thickened.  Persistent median artery was present.  The patient was taken to  the recovery room for observation in satisfactory condition.  The patient  is discharged home to return to the [facility] [place] in one week on  Vicodin and Keflex.                                               Cindee Salt, M.D.    Angelique Blonder  D:  09/17/2002  T:  09/17/2002  Job:  [id number]

## 2010-06-30 NOTE — Op Note (Signed)
[facility]  Patient:    Suzanne Spencer, Suzanne Spencer Visit Number: [id number] MRN: [id number]          Service Type: MED Location: [phone] Attending Physician:  Sharon Mt Dictated by:   Almedia Balls. Randell Patient, M.D. Proc. Date: 03/29/01 Admit Date:  03/28/2001   CC:         Reuel Boom L. Eda Paschal, M.D.   Operative Report  PREOPERATIVE DIAGNOSES:   Subfascial abscess status post exploratory laparotomy, extensive lysis of adhesions, and left salpingo-oophorectomy.  POSTOPERATIVE DIAGNOSES:   Subfascial abscess status post exploratory laparotomy, extensive lysis of adhesions, and left salpingo-oophorectomy.  OPERATION PERFORMED:  Incision and drainage of subfascial abscess.  ANESTHESIA:  General oral tracheal.  SURGEON:  Almedia Balls. Randell Patient, M.D.  INDICATIONS FOR PROCEDURE:  The patient is a 60 year old who underwent exploratory laparotomy, laparoscopy, left salpingo-oophorectomy after extensive lysis of adhesions on March 20, 2001. She was admitted on March 28, 2001 with abdominal pain and tender abdominal area. CT scan showed an extraperitoneal subfascial abscess in the area of the rectus muscles. She was brought to the operating room at this time for I&D of this problem. She has been fully counselled as to the nature of the procedure and the risks involved to include risk of anesthesia, injury to surrounding structures, postoperative hemorrhage, infection, delayed healing and recuperation. She fully understands all these considerations and wishes to proceed on March 29, 2001.  OPERATIVE FINDINGS:  On examination under anesthesia, there was noted to be a fluctuant area mainly to the right of the midline. On entry into the subfascial space, a large amount of purulent material was obtained with a slightly foul odor. There were multiple loculations of this material underneath the fascia and intertwined in rectus abdominis muscles mainly on the right.  The peritoneum appeared to be totally intact. The fascia did not appear to be necrotic and there was good blood supply and all areas opened.  DESCRIPTION OF PROCEDURE:  With the patient under general anesthesia, prepared and draped in the usual sterile fashion, with a Foley catheter in the bladder, a lower abdominal transverse incision was made through a previous surgical incision. This was carried to the fascial layer without difficulty. The fascia had already been disrupted on the right side and a large amount of purulent material was obtained. This was opened totally with the fascia being opened the full width of the transverse incision. Blunt dissection was used to break up loculations of areas of infection. The area was lavaged with copious amounts of bug juice solution. Small bleeders rendered hemostatic with Bovie electrocoagulation well away from areas of the peritoneum. Care was taken to not increase any necrosis with Bovie electrocoagulation. The incision and abscess area were packed open with two rolls of Kerlix gauze soaked in bug juice. Estimated blood loss less than 50 ml for the procedure. The patient was taken to the recovery room in good condition with clear urine in the Foley catheter tubing. She will be continued on intravenous antibiotics and packing in the wound following surgery. Dictated by:   Almedia Balls Randell Patient, M.D. Attending Physician:  Sharon Mt DD:  03/29/01 TD:  03/29/01 Job: [id number]

## 2010-09-05 ENCOUNTER — Ambulatory Visit (INDEPENDENT_AMBULATORY_CARE_PROVIDER_SITE_OTHER): Payer: 59 | Admitting: Gynecology

## 2010-09-05 DIAGNOSIS — M8580 Other specified disorders of bone density and structure, unspecified site: Secondary | ICD-10-CM

## 2010-09-05 DIAGNOSIS — M899 Disorder of bone, unspecified: Secondary | ICD-10-CM

## 2010-09-05 DIAGNOSIS — Z780 Asymptomatic menopausal state: Secondary | ICD-10-CM

## 2010-09-05 DIAGNOSIS — E2839 Other primary ovarian failure: Secondary | ICD-10-CM

## 2010-09-06 ENCOUNTER — Other Ambulatory Visit: Payer: Self-pay

## 2010-09-06 DIAGNOSIS — M8580 Other specified disorders of bone density and structure, unspecified site: Secondary | ICD-10-CM

## 2010-09-06 DIAGNOSIS — E2839 Other primary ovarian failure: Secondary | ICD-10-CM

## 2010-09-06 DIAGNOSIS — Z780 Asymptomatic menopausal state: Secondary | ICD-10-CM

## 2010-09-08 ENCOUNTER — Telehealth: Payer: Self-pay

## 2010-09-08 NOTE — Telephone Encounter (Signed)
PER G & DEXA RESULTS PT NOTIFIED NO INCREASED FRACTURE RISK BUT DECREASE IN SPINE & HIP BONES. CONTINUE PRESENT REGIMEN & SET UP APPT. TO DISCUSS RESULTS IN DETAIL. PT. STATES SHE HAS UPCOMING AEX 09-2010 & WILL DISCUSS WITH DR. GOTTSEGEN THEN.

## 2010-09-15 ENCOUNTER — Ambulatory Visit (INDEPENDENT_AMBULATORY_CARE_PROVIDER_SITE_OTHER): Payer: 59 | Admitting: Internal Medicine

## 2010-09-15 ENCOUNTER — Encounter: Payer: Self-pay | Admitting: Internal Medicine

## 2010-09-15 VITALS — BP 118/78 | HR 89 | Temp 98.8°F | Wt 177.8 lb

## 2010-09-15 DIAGNOSIS — K1370 Unspecified lesions of oral mucosa: Secondary | ICD-10-CM

## 2010-09-15 NOTE — Progress Notes (Signed)
  Subjective:    Patient ID: Suzanne Spencer, female    DOB: 1950-02-20, 60 y.o.   MRN: [id number]  HPI  Lump: Onset:2 days ago as "soreness" L mandible; movable "knot " noted today Trigger/injury:no Pain, redness swelling:no but tender Constitutional: Fever, chills, sweats, weight change:weight loss with TLC Heme: Abnormal bruising or clotting, lymphadenopathy:no Treatment/response:no     Review of Systems     Objective:   Physical Exam she is healthy-appearing; she is in no distress  Otic canals are clear tympanic membranes are normal.  Nares are patent with healthy-appearing tissues  She has no lymphadenopathy the head neck or axilla  Dental hygiene is excellent. There is a whitish linear change on the inside of the left buccal mucosa. Mid way along this line there is  punctate erythema. There is minor subcutaneous cartilaginous changes along the left mandible. The masseter muscles are normal with no tenderness or swelling to suggest parotiditis. There is normal temporomandibular joint range of motion without tenderness.        Assessment & Plan:  #1 linear hypopigmented changes of the left buccal mucosa. This would appear to be related to dental trauma.  The buccal changes do not look like leukoplakia.  Plan: Antiseptic swish or  oral gel would be recommended. She should report any persistence or progression of symptoms.  Note: After explaining the findings, she admits that she does have a habit of chewing on the mucosa on that side. The long-term risk of repeated dental trauma to the oral mucosa was discussed. She will have her Dentist, Dr Ralph Leyden  recheck this when she is seen in approximately one month.

## 2010-09-15 NOTE — Patient Instructions (Signed)
Please share this note with Dr. Ralph Leyden.

## 2010-09-21 ENCOUNTER — Encounter: Payer: Self-pay | Admitting: Obstetrics and Gynecology

## 2010-09-21 ENCOUNTER — Ambulatory Visit (INDEPENDENT_AMBULATORY_CARE_PROVIDER_SITE_OTHER): Payer: 59 | Admitting: Obstetrics and Gynecology

## 2010-09-21 ENCOUNTER — Other Ambulatory Visit (HOSPITAL_COMMUNITY)
Admission: RE | Admit: 2010-09-21 | Discharge: 2010-09-21 | Disposition: A | Discharge status: Home / Self Care | Payer: 59 | Source: Ambulatory Visit | Attending: Obstetrics and Gynecology | Admitting: Obstetrics and Gynecology

## 2010-09-21 VITALS — BP 124/74 | Ht 61.5 in | Wt 178.0 lb

## 2010-09-21 DIAGNOSIS — Z01419 Encounter for gynecological examination (general) (routine) without abnormal findings: Secondary | ICD-10-CM

## 2010-09-21 DIAGNOSIS — M899 Disorder of bone, unspecified: Secondary | ICD-10-CM

## 2010-09-21 DIAGNOSIS — M8580 Other specified disorders of bone density and structure, unspecified site: Secondary | ICD-10-CM

## 2010-09-21 DIAGNOSIS — R82998 Other abnormal findings in urine: Secondary | ICD-10-CM

## 2010-09-21 NOTE — Progress Notes (Signed)
Addended byCammie Mcgee T on: 09/21/2010 01:09 PM   Modules accepted: Orders

## 2010-09-21 NOTE — Progress Notes (Signed)
The patient came back to see me today for annual GYN exam. She is having no menopausal symptoms but she is on Effexor. She did have a bone density done in our office which showed more bone loss. She has previously been on Fosamax but is on drug holiday. She is taking calcium and D. Her D. Level is 50. She is exercising regularly. Even though she's been on medicine we did a FRAX on her which not show an elevated risk of fracture. Due to an area of asymmetry on mammogram she needs a followup mammogram in 6 months. She is up-to-date on colonoscopies.  Physical examination: HEENT within normal limits. Neck: Thyroid not large. No masses. Supraclavicular nodes: not enlarged. Breasts: Examined in both sitting midline position. No skin changes and no masses. Abdomen: Soft no guarding rebound or masses or hernia. Pelvic: External: Within normal limits. BUS: Within normal limits. Vaginal:within normal limits. Good estrogen effect. No evidence of cystocele rectocele or enterocele. Cervix: clean. Uterus: Normal size and shape. Adnexa: No masses. Rectovaginal exam: Confirmatory and negative. Extremities: Within normal limits.  Assessment: 1. Osteopenia 2. Asymmetry on mammogram without mass.  Plan: 1.Mammogram in 6 months 2.discussed her bones in detail and we will continue without medication.

## 2010-10-31 ENCOUNTER — Encounter: Payer: Self-pay | Admitting: Obstetrics and Gynecology

## 2010-11-21 ENCOUNTER — Ambulatory Visit (INDEPENDENT_AMBULATORY_CARE_PROVIDER_SITE_OTHER): Payer: 59

## 2010-11-21 DIAGNOSIS — Z23 Encounter for immunization: Secondary | ICD-10-CM

## 2011-02-27 ENCOUNTER — Encounter: Payer: Self-pay | Admitting: Obstetrics and Gynecology

## 2011-09-14 ENCOUNTER — Encounter: Payer: Self-pay | Admitting: Obstetrics and Gynecology

## 2011-10-02 ENCOUNTER — Encounter: Payer: 59 | Admitting: Obstetrics and Gynecology

## 2011-11-12 ENCOUNTER — Encounter: Payer: 59 | Admitting: Obstetrics and Gynecology

## 2011-11-13 ENCOUNTER — Encounter: Payer: Self-pay | Admitting: Obstetrics and Gynecology

## 2011-11-13 ENCOUNTER — Ambulatory Visit (INDEPENDENT_AMBULATORY_CARE_PROVIDER_SITE_OTHER): Payer: 59 | Admitting: Obstetrics and Gynecology

## 2011-11-13 VITALS — BP 124/76 | Ht 61.5 in | Wt 197.0 lb

## 2011-11-13 DIAGNOSIS — Z01419 Encounter for gynecological examination (general) (routine) without abnormal findings: Secondary | ICD-10-CM

## 2011-11-13 DIAGNOSIS — N8500 Endometrial hyperplasia, unspecified: Secondary | ICD-10-CM | POA: Insufficient documentation

## 2011-11-13 DIAGNOSIS — N952 Postmenopausal atrophic vaginitis: Secondary | ICD-10-CM | POA: Insufficient documentation

## 2011-11-13 DIAGNOSIS — N870 Mild cervical dysplasia: Secondary | ICD-10-CM

## 2011-11-13 DIAGNOSIS — E785 Hyperlipidemia, unspecified: Secondary | ICD-10-CM | POA: Insufficient documentation

## 2011-11-13 DIAGNOSIS — K269 Duodenal ulcer, unspecified as acute or chronic, without hemorrhage or perforation: Secondary | ICD-10-CM | POA: Insufficient documentation

## 2011-11-13 DIAGNOSIS — N83209 Unspecified ovarian cyst, unspecified side: Secondary | ICD-10-CM | POA: Insufficient documentation

## 2011-11-13 DIAGNOSIS — M8580 Other specified disorders of bone density and structure, unspecified site: Secondary | ICD-10-CM

## 2011-11-13 DIAGNOSIS — M949 Disorder of cartilage, unspecified: Secondary | ICD-10-CM

## 2011-11-13 DIAGNOSIS — M899 Disorder of bone, unspecified: Secondary | ICD-10-CM

## 2011-11-13 LAB — CBC WITH DIFFERENTIAL/PLATELET
Basophils Relative: 1 % (ref 0–1)
HCT: 40.1 % (ref 36.0–46.0)
Hemoglobin: 13 g/dL (ref 12.0–15.0)
Lymphocytes Relative: 36 % (ref 12–46)
Lymphs Abs: 2.5 10*3/uL (ref 0.7–4.0)
MCHC: 32.4 g/dL (ref 30.0–36.0)
Monocytes Relative: 8 % (ref 3–12)
Neutro Abs: 3.5 10*3/uL (ref 1.7–7.7)
Neutrophils Relative %: 52 % (ref 43–77)
RBC: 4.56 MIL/uL (ref 3.87–5.11)
WBC: 6.8 10*3/uL (ref 4.0–10.5)

## 2011-11-13 LAB — LIPID PANEL
LDL Cholesterol: 97 mg/dL (ref 0–99)
VLDL: 29 mg/dL (ref 0–40)

## 2011-11-13 NOTE — Progress Notes (Signed)
Patient came to see me today for her annual GYN exam. She has just an occasional hot flash. She is on Effexor. She is not aware of vaginal dryness. She had a normal mammogram this year. 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 She has had normal yearly Pap smears since then. Her last Pap smear was 2012. She an exploratory laparotomy for benign ovarian cysts in 2003. At that time a left salpingo-oophorectomy was done. She is osteopenia without an elevated fracture risk. Her last bone density was 2012. She has had no fractures. She's had no vaginal bleeding. She previously had simple endometrial hyperplasia without atypia which resolved. She is having no pelvic pain. She does not have dyspareunia or vaginal dryness.  Physical examination:Kim Julian Reil present. HEENT within normal limits. Neck: Thyroid not large. No masses. Supraclavicular nodes: not enlarged. Breasts: Examined in both sitting and lying  position. No skin changes and no masses. Abdomen: Soft no guarding rebound or masses or hernia. Pelvic: External: Within normal limits. BUS: Within normal limits. Vaginal:within normal limits. Good estrogen effect. No evidence of cystocele rectocele or enterocele. Cervix: clean. Uterus: Normal size and shape. Adnexa: No masses. Rectovaginal exam: Confirmatory and negative. Extremities: Within normal limits.  Assessment: #1. CIN-1 #2. Osteopenia  Plan: Continue yearly mammograms. Bone density next year. Pap not done.The new Pap smear guidelines were discussed with the patient.

## 2011-11-13 NOTE — Patient Instructions (Signed)
Continue yearly mammograms. Bone density in 2014. 

## 2011-11-14 LAB — URINALYSIS W MICROSCOPIC + REFLEX CULTURE
Glucose, UA: NEGATIVE mg/dL
Hgb urine dipstick: NEGATIVE
Leukocytes, UA: NEGATIVE
Nitrite: NEGATIVE
Protein, ur: NEGATIVE mg/dL
Squamous Epithelial / LPF: NONE SEEN

## 2011-11-14 LAB — HEMOGLOBIN A1C
Hgb A1c MFr Bld: 5.7 % — ABNORMAL HIGH (ref <5.7)
Mean Plasma Glucose: 117 mg/dL — ABNORMAL HIGH (ref <117)

## 2011-11-20 ENCOUNTER — Ambulatory Visit (INDEPENDENT_AMBULATORY_CARE_PROVIDER_SITE_OTHER): Payer: 59

## 2011-11-20 DIAGNOSIS — Z23 Encounter for immunization: Secondary | ICD-10-CM

## 2012-08-11 ENCOUNTER — Ambulatory Visit (INDEPENDENT_AMBULATORY_CARE_PROVIDER_SITE_OTHER): Payer: 59 | Admitting: Internal Medicine

## 2012-08-11 VITALS — BP 134/80 | HR 98 | Wt 208.0 lb

## 2012-08-11 DIAGNOSIS — M25569 Pain in unspecified knee: Secondary | ICD-10-CM

## 2012-08-11 DIAGNOSIS — M25561 Pain in right knee: Secondary | ICD-10-CM

## 2012-08-11 DIAGNOSIS — M25461 Effusion, right knee: Secondary | ICD-10-CM

## 2012-08-11 DIAGNOSIS — M25469 Effusion, unspecified knee: Secondary | ICD-10-CM

## 2012-08-11 MED ORDER — CELECOXIB 200 MG PO CAPS: 200.0000 mg | ORAL_CAPSULE | Freq: Two times a day (BID) | ORAL | Status: DC

## 2012-08-11 NOTE — Progress Notes (Signed)
  Subjective:    Patient ID: Suzanne Spencer, female    DOB: 1950/12/06, 62 y.o.   MRN: [id number]  HPI   Symptoms began 3 weeks ago as pain in the right knee without trigger or injury. There's a constant soreness but also an intermittent sharp pain up to a level X. Aspirin taken for headache did seem to help as did glucosamine chondroitin supplement.  She has a past history of a similar issue several years ago which responded to cortisone injection but it sports medicine specialist.    Review of Systems Negative or absent signs& symptoms are delineated below: Constitutional: no fever, chills, sweats, change in weight  Musculoskeletal:no  muscle cramps or pain; no  joint stiffness, redness, or swelling Skin:no rash, color/temp change Neuro: no weakness; incontinence (stool/urine); numbness and tingling Heme:no lymphadenopathy; abnormal bruising or bleeding                                                                                      Objective:   Physical Exam  Gen.: Healthy and well-nourished in appearance. Alert, appropriate and cooperative throughout exam.  Neck: No deformities, masses, or tenderness noted. Range of motion normal.                            Musculoskeletal/extremities: Accentuation of noted of the upper thoracic spine. No clubbing, cyanosis, edema, or significant extremity  deformity noted. Range of motion normal .Tone & strength  normal.Joints normal . Nail health good. Able to lie down & sit up w/o help. Negative SLR bilaterally. She has mild crepitus of the knees. They're fusiform changes in the right knee with suggestion clinically of some patellar effusion. Vascular: dorsalis pedis and  posterior tibial pulses are full and equal.  Neurologic: Alert and oriented x3. Deep tendon reflexes symmetrical and normal.  Skin: Intact without suspicious lesions or rashes. Lymph: No cervical, axillary lymphadenopathy present. Psych: Mood and affect are normal.  Normally interactive         Assessment & Plan:  #1 right knee pain with clinical effusion  Plan: See orders and recommendations

## 2012-08-11 NOTE — Patient Instructions (Addendum)
Use an anti-inflammatory cream such as Aspercreme or Zostrix cream twice a day to the  knee as needed. In lieu of this warm moist compresses or  hot water bottle can be used. Do not apply ice to the knee. Consider glucosamine sulfate 1500 mg daily for joint symptoms. Take this daily  for 3 months and then leave it off for 2 months. This will rehydrate the cartilages. 

## 2012-09-10 ENCOUNTER — Encounter: Payer: Self-pay | Admitting: Gynecology

## 2012-11-13 ENCOUNTER — Encounter: Payer: 59 | Admitting: Gynecology

## 2012-11-25 ENCOUNTER — Ambulatory Visit (INDEPENDENT_AMBULATORY_CARE_PROVIDER_SITE_OTHER): Payer: 59

## 2012-11-25 DIAGNOSIS — Z23 Encounter for immunization: Secondary | ICD-10-CM

## 2012-12-12 ENCOUNTER — Encounter: Payer: Self-pay | Admitting: Gynecology

## 2012-12-12 ENCOUNTER — Ambulatory Visit (INDEPENDENT_AMBULATORY_CARE_PROVIDER_SITE_OTHER): Payer: 59 | Admitting: Gynecology

## 2012-12-12 VITALS — BP 136/80 | Ht 61.5 in | Wt 207.0 lb

## 2012-12-12 DIAGNOSIS — Z01419 Encounter for gynecological examination (general) (routine) without abnormal findings: Secondary | ICD-10-CM

## 2012-12-12 DIAGNOSIS — M8580 Other specified disorders of bone density and structure, unspecified site: Secondary | ICD-10-CM

## 2012-12-12 DIAGNOSIS — Z1159 Encounter for screening for other viral diseases: Secondary | ICD-10-CM

## 2012-12-12 DIAGNOSIS — R635 Abnormal weight gain: Secondary | ICD-10-CM

## 2012-12-12 DIAGNOSIS — Z780 Asymptomatic menopausal state: Secondary | ICD-10-CM

## 2012-12-12 DIAGNOSIS — N951 Menopausal and female climacteric states: Secondary | ICD-10-CM

## 2012-12-12 DIAGNOSIS — M899 Disorder of bone, unspecified: Secondary | ICD-10-CM

## 2012-12-12 LAB — COMPREHENSIVE METABOLIC PANEL
AST: 21 U/L (ref 0–37)
Albumin: 4.4 g/dL (ref 3.5–5.2)
Alkaline Phosphatase: 106 U/L (ref 39–117)
Chloride: 103 mEq/L (ref 96–112)
Potassium: 4.1 mEq/L (ref 3.5–5.3)
Sodium: 141 mEq/L (ref 135–145)
Total Protein: 7.2 g/dL (ref 6.0–8.3)

## 2012-12-12 LAB — CBC WITH DIFFERENTIAL/PLATELET
Basophils Absolute: 0.1 10*3/uL (ref 0.0–0.1)
Basophils Relative: 1 % (ref 0–1)
Hemoglobin: 13.3 g/dL (ref 12.0–15.0)
Lymphocytes Relative: 32 % (ref 12–46)
MCHC: 33.6 g/dL (ref 30.0–36.0)
Monocytes Relative: 8 % (ref 3–12)
Neutro Abs: 4.9 10*3/uL (ref 1.7–7.7)
Neutrophils Relative %: 57 % (ref 43–77)
WBC: 8.4 10*3/uL (ref 4.0–10.5)

## 2012-12-12 NOTE — Patient Instructions (Signed)
Patient information: High cholesterol (The Basics)  What is cholesterol? - Cholesterol is a substance that is found in the blood. Everyone has some. It is needed for good health. The problem is, people sometimes have too much cholesterol. Compared with people with normal cholesterol, people with high cholesterol have a higher risk of heart attacks, strokes, and other health problems. The higher your cholesterol, the higher your risk of these problems. Cholesterol levels in your body are determined significantly by your diet. Cholesterol levels may also be related to heart disease. The following material helps to explain this relationship and discusses what you can do to help keep your heart healthy. Not all cholesterol is bad. Low-density lipoprotein (LDL) cholesterol is the "bad" cholesterol. It may cause fatty deposits to build up inside your arteries. High-density lipoprotein (HDL) cholesterol is "good." It helps to remove the "bad" LDL cholesterol from your blood. Cholesterol is a very important risk factor for heart disease. Other risk factors are high blood pressure, smoking, stress, heredity, and weight.  The heart muscle gets its supply of blood through the coronary arteries. If your LDL cholesterol is high and your HDL cholesterol is low, you are at risk for having fatty deposits build up in your coronary arteries. This leaves less room through which blood can flow. Without sufficient blood and oxygen, the heart muscle cannot function properly and you may feel chest pains (angina pectoris). When a coronary artery closes up entirely, a part of the heart muscle may die, causing a heart attack (myocardial infarction).  CHECKING CHOLESTEROL When your caregiver sends your blood to a lab to be analyzed for cholesterol, a complete lipid (fat) profile may be done. With this test, the total amount of cholesterol and levels of LDL and HDL are determined. Triglycerides  are a type of fat that circulates in the blood and can also be used to determine heart disease risk. Are there different types of cholesterol? - Yes, there are a few different types. If you get a cholesterol test, you may hear your doctor or nurse talk about: Total cholesterol  LDL cholesterol - Some people call this the "bad" cholesterol. That's because having high LDL levels raises your risk of heart attacks, strokes, and other health problems.  HDL cholesterol - Some people call this the "good" cholesterol. That's because having high HDL levels lowers your risk of heart attacks, strokes, and other health problems.  Non-HDL cholesterol - Non-HDL cholesterol is your total cholesterol minus your HDL cholesterol.  Triglycerides - Triglycerides are not cholesterol. They are a type of fat. But they often get measured when cholesterol is measured. (Having high triglycerides also seems to increase the risk of heart attacks and strokes.)   Keep in mind, though, that many people who cannot meet these goals still have a low risk of heart attacks and strokes. What should I do if my doctor tells me I have high cholesterol? - Ask your doctor what your overall risk of heart attacks and strokes is. High cholesterol, by itself, is not always a reason to worry. Having high cholesterol is just one of many things that can increase your risk of heart attacks and strokes. Other factors that increase your risk include:  Cigarette smoking  High blood pressure  Having a parent, sister, or brother who got heart disease at a young age (Young, in this case, means younger than 55 for men and younger than 65 for women.)  Being a man (Women are at risk, too, but men   have a higher risk.)  Older age  If you are at high risk of heart attacks and strokes, having high cholesterol is a problem. On the other hand, if you have are at low risk, having high cholesterol may not mean much. Should I take medicine to lower cholesterol? - Not  everyone who has high cholesterol needs medicines. Your doctor or nurse will decide if you need them based on your age, family history, and other health concerns.  You should probably take a cholesterol-lowering medicine called a statin if you: Already had a heart attack or stroke  Have known heart disease  Have diabetes  Have a condition called peripheral artery disease, which makes it painful to walk, and happens when the arteries in your legs get clogged with fatty deposits  Have an abdominal aortic aneurysm, which is a widening of the main artery in the belly  Most people with any of the conditions listed above should take a statin no matter what their cholesterol level is. If your doctor or nurse puts you on a statin, stay on it. The medicine may not make you feel any different. But it can help prevent heart attacks, strokes, and death.  Can I lower my cholesterol without medicines? - Yes, you can lower your cholesterol some by:  Avoiding red meat, butter, fried foods, cheese, and other foods that have a lot of saturated fat  Losing weight (if you are overweight)  Being more active Even if these steps do little to change your cholesterol, they can improve your health in many ways.                                                   Cholesterol Control Diet  CONTROLLING CHOLESTEROL WITH DIET Although exercise and lifestyle factors are important, your diet is key. That is because certain foods are known to raise cholesterol and others to lower it. The goal is to balance foods for their effect on cholesterol and more importantly, to replace saturated and trans fat with other types of fat, such as monounsaturated fat, polyunsaturated fat, and omega-3 fatty acids. On average, a person should consume no more than 15 to 17 g of saturated fat daily. Saturated and trans fats are considered "bad" fats, and they will raise LDL cholesterol. Saturated fats are primarily found in animal products such as  meats, butter, and cream. However, that does not mean you need to sacrifice all your favorite foods. Today, there are good tasting, low-fat, low-cholesterol substitutes for most of the things you like to eat. Choose low-fat or nonfat alternatives. Choose round or loin cuts of red meat, since these types of cuts are lowest in fat and cholesterol. Chicken (without the skin), fish, veal, and ground turkey breast are excellent choices. Eliminate fatty meats, such as hot dogs and salami. Even shellfish have little or no saturated fat. Have a 3 oz (85 g) portion when you eat lean meat, poultry, or fish. Trans fats are also called "partially hydrogenated oils." They are oils that have been scientifically manipulated so that they are solid at room temperature resulting in a longer shelf life and improved taste and texture of foods in which they are added. Trans fats are found in stick margarine, some tub margarines, cookies, crackers, and baked goods.  When baking and cooking, oils are an excellent substitute   for butter. The monounsaturated oils are especially beneficial since it is believed they lower LDL and raise HDL. The oils you should avoid entirely are saturated tropical oils, such as coconut and palm.  Remember to eat liberally from food groups that are naturally free of saturated and trans fat, including fish, fruit, vegetables, beans, grains (barley, rice, couscous, bulgur wheat), and pasta (without cream sauces).  IDENTIFYING FOODS THAT LOWER CHOLESTEROL  Soluble fiber may lower your cholesterol. This type of fiber is found in fruits such as apples, vegetables such as broccoli, potatoes, and carrots, legumes such as beans, peas, and lentils, and grains such as barley. Foods fortified with plant sterols (phytosterol) may also lower cholesterol. You should eat at least 2 g per day of these foods for a cholesterol lowering effect.  Read package labels to identify low-saturated fats, trans fats free, and  low-fat foods at the supermarket. Select cheeses that have only 2 to 3 g saturated fat per ounce. Use a heart-healthy tub margarine that is free of trans fats or partially hydrogenated oil. When buying baked goods (cookies, crackers), avoid partially hydrogenated oils. Breads and muffins should be made from whole grains (whole-wheat or whole oat flour, instead of "flour" or "enriched flour"). Buy non-creamy canned soups with reduced salt and no added fats.  FOOD PREPARATION TECHNIQUES  Never deep-fry. If you must fry, either stir-fry, which uses very little fat, or use non-stick cooking sprays. When possible, broil, bake, or roast meats, and steam vegetables. Instead of dressing vegetables with butter or margarine, use lemon and herbs, applesauce and cinnamon (for squash and sweet potatoes), nonfat yogurt, salsa, and low-fat dressings for salads.  LOW-SATURATED FAT / LOW-FAT FOOD SUBSTITUTES Meats / Saturated Fat (g)  Avoid: Steak, marbled (3 oz/85 g) / 11 g   Choose: Steak, lean (3 oz/85 g) / 4 g   Avoid: Hamburger (3 oz/85 g) / 7 g   Choose: Hamburger, lean (3 oz/85 g) / 5 g   Avoid: Ham (3 oz/85 g) / 6 g   Choose: Ham, lean cut (3 oz/85 g) / 2.4 g   Avoid: Chicken, with skin, dark meat (3 oz/85 g) / 4 g   Choose: Chicken, skin removed, dark meat (3 oz/85 g) / 2 g   Avoid: Chicken, with skin, light meat (3 oz/85 g) / 2.5 g   Choose: Chicken, skin removed, light meat (3 oz/85 g) / 1 g  Dairy / Saturated Fat (g)  Avoid: Whole milk (1 cup) / 5 g   Choose: Low-fat milk, 2% (1 cup) / 3 g   Choose: Low-fat milk, 1% (1 cup) / 1.5 g   Choose: Skim milk (1 cup) / 0.3 g   Avoid: Hard cheese (1 oz/28 g) / 6 g   Choose: Skim milk cheese (1 oz/28 g) / 2 to 3 g   Avoid: Cottage cheese, 4% fat (1 cup) / 6.5 g   Choose: Low-fat cottage cheese, 1% fat (1 cup) / 1.5 g   Avoid: Ice cream (1 cup) / 9 g   Choose: Sherbet (1 cup) / 2.5 g   Choose: Nonfat frozen yogurt (1 cup) / 0.3 g    Choose: Frozen fruit bar / trace   Avoid: Whipped cream (1 tbs) / 3.5 g   Choose: Nondairy whipped topping (1 tbs) / 1 g  Condiments / Saturated Fat (g)  Avoid: Mayonnaise (1 tbs) / 2 g   Choose: Low-fat mayonnaise (1 tbs) / 1 g   Avoid:   Butter (1 tbs) / 7 g   Choose: Extra light margarine (1 tbs) / 1 g   Avoid: Coconut oil (1 tbs) / 11.8 g   Choose: Olive oil (1 tbs) / 1.8 g   Choose: Corn oil (1 tbs) / 1.7 g   Choose: Safflower oil (1 tbs) / 1.2 g   Choose: Sunflower oil (1 tbs) / 1.4 g   Choose: Soybean oil (1 tbs) / 2.4 g   Choose: Canola oil (1 tbs) / 1 g  Exercise to Lose Weight Exercise and a healthy diet may help you lose weight. Your doctor may suggest specific exercises. EXERCISE IDEAS AND TIPS  Choose low-cost things you enjoy doing, such as walking, bicycling, or exercising to workout videos.   Take stairs instead of the elevator.   Walk during your lunch break.   Park your car further away from work or school.   Go to a gym or an exercise class.   Start with 5 to 10 minutes of exercise each day. Build up to 30 minutes of exercise 4 to 6 days a week.   Wear shoes with good support and comfortable clothes.   Stretch before and after working out.   Work out until you breathe harder and your heart beats faster.   Drink extra water when you exercise.   Do not do so much that you hurt yourself, feel dizzy, or get very short of breath.  Exercises that burn about 150 calories:  Running 1  miles in 15 minutes.   Playing volleyball for 45 to 60 minutes.   Washing and waxing a car for 45 to 60 minutes.   Playing touch football for 45 minutes.   Walking 1  miles in 35 minutes.   Pushing a stroller 1  miles in 30 minutes.   Playing basketball for 30 minutes.   Raking leaves for 30 minutes.   Bicycling 5 miles in 30 minutes.   Walking 2 miles in 30 minutes.   Dancing for 30 minutes.   Shoveling snow for 15 minutes.   Swimming laps  for 20 minutes.   Walking up stairs for 15 minutes.   Bicycling 4 miles in 15 minutes.   Gardening for 30 to 45 minutes.   Jumping rope for 15 minutes.   Washing windows or floors for 45 to 60 minutes.  Document Released: 03/03/2010 Document Revised: 10/11/2010 Document Reviewed: 03/03/2010 Exit[employer] Patient Information 2012 [employer].                                           

## 2012-12-12 NOTE — Progress Notes (Signed)
Suzanne Spencer [birth year] [id number]   History:    62 y.o.  for annual gyn exam with no complaints today. 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 Her subsequent Pap smears have been normal. Her last Pap smear was in 2012. Patient had exploratory laparotomy for a benign ovarian cyst in 2003. At that time she had a left salpingo-oophorectomy. Patient with known history of osteopenia but with no elevated fracture risk. She is taking calcium and vitamin D twice a day. Review of her last bone density study 2012 demonstrated, was T score was at the AP spine with a value -2.2. The patient had normal FRAX analysis. But on further review her AP spine bone the mineralization had a 15.9% decrease from 2010. Patient denies any prior history of hormone usage. Patient is sexually active. Her flu vaccine, Tdap and shingles vaccine are up-to-date. She is being followed by her psychiatrist.  Past medical history,surgical history, family history and social history were all reviewed and documented in the EPIC chart.  Gynecologic History No LMP recorded. Patient is postmenopausal. Contraception: post menopausal status Last Pap: 2012. Results were: normal Last mammogram: 2014. Results were: normal  Obstetric History OB History  Gravida Para Term Preterm AB SAB TAB Ectopic Multiple Living  1 1 1       1     # Outcome Date GA Lbr Len/2nd Weight Sex Delivery Anes PTL Lv  1 TRM                ROS: A ROS was performed and pertinent positives and negatives are included in the history.  GENERAL: No fevers or chills. HEENT: No change in vision, no earache, sore throat or sinus congestion. NECK: No pain or stiffness. CARDIOVASCULAR: No chest pain or pressure. No palpitations. PULMONARY: No shortness of breath, cough or wheeze. GASTROINTESTINAL: No abdominal pain, nausea, vomiting or diarrhea, melena or bright red blood per rectum. GENITOURINARY: No urinary  frequency, urgency, hesitancy or dysuria. MUSCULOSKELETAL: No joint or muscle pain, no back pain, no recent trauma. DERMATOLOGIC: No rash, no itching, no lesions. ENDOCRINE: No polyuria, polydipsia, no heat or cold intolerance. No recent change in weight. HEMATOLOGICAL: No anemia or easy bruising or bleeding. NEUROLOGIC: No headache, seizures, numbness, tingling or weakness. PSYCHIATRIC: No depression, no loss of interest in normal activity or change in sleep pattern.     Exam: chaperone present  BP 136/80  Ht 5' 1.5" (1.562 m)  Wt 207 lb (93.895 kg)  BMI 38.48 kg/m2  Body mass index is 38.48 kg/(m^2).  General appearance : Well developed well nourished female. No acute distress HEENT: Neck supple, trachea midline, no carotid bruits, no thyroidmegaly Lungs: Clear to auscultation, no rhonchi or wheezes, or rib retractions  Heart: Regular rate and rhythm, no murmurs or gallops Breast:Examined in sitting and supine position were symmetrical in appearance, no palpable masses or tenderness,  no skin retraction, no nipple inversion, no nipple discharge, no skin discoloration, no axillary or supraclavicular lymphadenopathy Abdomen: no palpable masses or tenderness, no rebound or guarding Extremities: no edema or skin discoloration or tenderness  Pelvic:  Bartholin, Urethra, Skene Glands: Within normal limits             Vagina: No gross lesions or discharge  Cervix: No gross lesions or discharge  Uterus  anteverted, normal size, shape and consistency, non-tender and mobile  Adnexa  Without masses or tenderness  Anus and perineum  normal  Rectovaginal  normal sphincter tone without palpated masses or tenderness             Hemoccult card provided     Assessment/Plan:  62 y.o. female for annual exam with past history of osteopenia. Patient will schedule her bone density study. The following lab work today: Vitamin D, CBC, screening cholesterol, urinalysis, comprehensive metabolic panel, TSH.  Pap smear not done today in accordance to the new guidelines. Hemoccult cards were provided to submit to the office for testing at a later date.  New CDC guidelines is recommending patients be tested once in her lifetime for hepatitis C antibody who were born between [birth year] through 1966. This was discussed with the patient today and has agreed to be tested today.  Note: This dictation was prepared with  Dragon/digital dictation along withSmart phrase technology. Any transcriptional errors that result from this process are unintentional.   Ok Edwards MD, 11:03 AM 12/12/2012

## 2012-12-13 LAB — URINALYSIS W MICROSCOPIC + REFLEX CULTURE
Bilirubin Urine: NEGATIVE
Casts: NONE SEEN
Glucose, UA: NEGATIVE mg/dL
Hgb urine dipstick: NEGATIVE
Leukocytes, UA: NEGATIVE
Squamous Epithelial / LPF: NONE SEEN
pH: 5 (ref 5.0–8.0)

## 2012-12-15 ENCOUNTER — Other Ambulatory Visit: Payer: Self-pay | Admitting: Gynecology

## 2012-12-15 DIAGNOSIS — E7800 Pure hypercholesterolemia, unspecified: Secondary | ICD-10-CM

## 2012-12-17 ENCOUNTER — Encounter: Payer: Self-pay | Admitting: Obstetrics and Gynecology

## 2012-12-22 ENCOUNTER — Other Ambulatory Visit: Payer: 59

## 2012-12-22 DIAGNOSIS — E7800 Pure hypercholesterolemia, unspecified: Secondary | ICD-10-CM

## 2012-12-22 LAB — LIPID PANEL
Cholesterol: 191 mg/dL (ref 0–200)
HDL: 68 mg/dL (ref >39)
LDL Cholesterol: 100 mg/dL — ABNORMAL HIGH (ref 0–99)
Total CHOL/HDL Ratio: 2.8 Ratio
Triglycerides: 113 mg/dL (ref <150)
VLDL: 23 mg/dL (ref 0–40)

## 2012-12-25 ENCOUNTER — Telehealth: Payer: Self-pay

## 2012-12-25 NOTE — Telephone Encounter (Signed)
Patient called stating she wanted to get her lipid profile results from Monday and did not have authorization code for My Chart and could not see them there. I returned her call and reviewed results with her.  I told her LDL only abnormal result and only elev by 1 point.  I told her Dr. Glenetta Hew had not reviewed these yet and I felt sure he would tell her that LDL is improved by exercise. She should be exercising, ie brisk walk, 30 mins for 3-4 times a week if possible and also, to decrease saturated fat in diet.  I mailed her a copy of her results as well as a copy of the sheet about My Chart that has her authorization code on it.

## 2013-01-01 ENCOUNTER — Other Ambulatory Visit: Payer: 59 | Admitting: Anesthesiology

## 2013-01-01 DIAGNOSIS — Z1211 Encounter for screening for malignant neoplasm of colon: Secondary | ICD-10-CM

## 2013-01-01 DIAGNOSIS — K921 Melena: Secondary | ICD-10-CM

## 2013-01-22 ENCOUNTER — Encounter: Payer: Self-pay | Admitting: Family Medicine

## 2013-01-22 ENCOUNTER — Ambulatory Visit (INDEPENDENT_AMBULATORY_CARE_PROVIDER_SITE_OTHER): Payer: 59 | Admitting: Family Medicine

## 2013-01-22 VITALS — BP 122/70 | HR 96 | Temp 98.6°F | Wt 204.6 lb

## 2013-01-22 DIAGNOSIS — L237 Allergic contact dermatitis due to plants, except food: Secondary | ICD-10-CM

## 2013-01-22 DIAGNOSIS — L255 Unspecified contact dermatitis due to plants, except food: Secondary | ICD-10-CM

## 2013-01-22 MED ORDER — PREDNISONE 10 MG PO TABS: ORAL_TABLET | ORAL | Status: DC

## 2013-01-22 MED ORDER — METHYLPREDNISOLONE ACETATE 80 MG/ML IJ SUSP
80.0000 mg | Freq: Once | INTRAMUSCULAR | Status: AC
  Administered 2013-01-22: 80 mg via INTRAMUSCULAR

## 2013-01-22 NOTE — Progress Notes (Signed)
Pre visit review using our clinic review tool, if applicable. No additional management support is needed unless otherwise documented below in the visit note. 

## 2013-01-22 NOTE — Patient Instructions (Signed)
Poison Oak Poison oak is an inflammation of the skin (contact dermatitis). It is caused by contact with the allergens on the leaves of the oak (toxicodendron) plants. Depending on your sensitivity, the rash may consist simply of redness and itching, or it may also progress to blisters which may break open (rupture). These must be well cared for to prevent secondary germ (bacterial) infection as these infections can lead to scarring. The eyes may also get puffy. The puffiness is worst in the morning and gets better as the day progresses. Healing is best accomplished by keeping any open areas dry, clean, covered with a bandage, and covered with an antibacterial ointment if needed. Without secondary infection, this dermatitis usually heals without scarring within 2 to 3 weeks without treatment. HOME CARE INSTRUCTIONS When you have been exposed to poison oak, it is very important to thoroughly wash with soap and water as soon as the exposure has been discovered. You have about one half hour to remove the plant resin before it will cause the rash. This cleaning will quickly destroy the oil or antigen on the skin (the antigen is what causes the rash). Wash aggressively under the fingernails as any plant resin still there will continue to spread the rash. Do not rub skin vigorously when washing affected area. Poison oak cannot spread if no oil from the plant remains on your body. Rash that has progressed to weeping sores (lesions) will not spread the rash unless you have not washed thoroughly. It is also important to clean any clothes you have been wearing as they may carry active allergens which will spread the rash, even several days later. Avoidance of the plant in the future is the best measure. Poison oak plants can be recognized by the number of leaves. Generally, poison oak has three leaves with flowering branches on a single stem. Diphenhydramine may be purchased over the counter and used as needed for  itching. Do not drive with this medication if it makes you drowsy. Ask your caregiver about medication for children. SEEK IMMEDIATE MEDICAL CARE IF:   Open areas of the rash develop.  You notice redness extending beyond the area of the rash.  There is a pus like discharge.  There is increased pain.  Other signs of infection develop (such as fever). Document Released: 08/05/2002 Document Revised: 04/23/2011 Document Reviewed: 12/15/2008 Exit[employer] Patient Information 2014 [employer].  

## 2013-01-22 NOTE — Addendum Note (Signed)
Addended by: Arnette Norris on: 01/22/2013 04:18 PM   Modules accepted: Orders

## 2013-01-22 NOTE — Progress Notes (Signed)
  Subjective:     Suzanne Spencer is a 62 y.o. female who presents for evaluation of a rash involving the trunk. Rash started a few days ago. Lesions are pink, and blistering in texture. Rash has changed over time. Rash is pruritic. Associated symptoms: none. Patient denies: abdominal pain, arthralgia, congestion, cough, crankiness, decrease in appetite, decrease in energy level, fever, headache, irritability, myalgia, nausea, sore throat and vomiting. Patient has not had contacts with similar rash. Patient has had new exposures (soaps, lotions, laundry detergents, foods, medications, plants, insects or animals)--poison oak.  The following portions of the patient's history were reviewed and updated as appropriate: allergies, current medications, past family history, past medical history, past social history, past surgical history and problem list.  Review of Systems Pertinent items are noted in HPI.    Objective:    BP 122/70  Pulse 96  Temp(Src) 98.6 F (37 C) (Oral)  Wt 204 lb 9.6 oz (92.806 kg)  SpO2 95% General:  alert, cooperative, appears stated age and mild distress  Skin:  vesicles noted on trunk     Assessment:    contact dermatitis: plants poison oak    Plan:    Medications: benadryl and steroids: depo medrol 80 mg IM and pred taper. Verbal patient instruction given. Follow up in a few days. ---or sooner prn

## 2013-02-16 ENCOUNTER — Encounter: Payer: Self-pay | Admitting: Gastroenterology

## 2013-02-16 ENCOUNTER — Ambulatory Visit (INDEPENDENT_AMBULATORY_CARE_PROVIDER_SITE_OTHER): Payer: 59 | Admitting: Gastroenterology

## 2013-02-16 VITALS — BP 130/70 | HR 80 | Ht 61.0 in | Wt 206.0 lb

## 2013-02-16 DIAGNOSIS — R195 Other fecal abnormalities: Secondary | ICD-10-CM

## 2013-02-16 MED ORDER — PEG-KCL-NACL-NASULF-NA ASC-C 100 G PO SOLR: 1.0000 | Freq: Once | ORAL | Status: DC

## 2013-02-16 NOTE — Patient Instructions (Signed)
You have been scheduled for a colonoscopy with propofol. Please follow written instructions given to you at your visit today.  Please pick up your prep kit at the pharmacy within the next 1-3 days.   Thank you for choosing me and [facility].  Malcolm T. Stark, Jr., MD., FACG  

## 2013-02-16 NOTE — Progress Notes (Signed)
    History of Present Illness: This is a 63 year old female referred for evaluation of positive stool Hemoccults. Patient underwent colonoscopy for screening in January 2011 and that exam was normal. She has no gastrointestinal complaints except for occasional external hemorrhoidal swelling and itching. Denies weight loss, abdominal pain, constipation, diarrhea, change in stool caliber, melena, hematochezia, nausea, vomiting, dysphagia, reflux symptoms, chest pain.  Review of Systems: Pertinent positive and negative review of systems were noted in the above HPI section. All other review of systems were otherwise negative.  Current Medications, Allergies, Past Medical History, Past Surgical History, Family History and Social History were reviewed in [facility] electronic medical record.  Physical Exam: General: Well developed , well nourished, no acute distress Head: Normocephalic and atraumatic Eyes:  sclerae anicteric, EOMI Ears: Normal auditory acuity Mouth: No deformity or lesions Neck: Supple, no masses or thyromegaly Lungs: Clear throughout to auscultation Heart: Regular rate and rhythm; no murmurs, rubs or bruits Abdomen: Soft, non tender and non distended. No masses, hepatosplenomegaly or hernias noted. Normal Bowel sounds Rectal: Deferred to colonoscopy Musculoskeletal: Symmetrical with no gross deformities  Skin: No lesions on visible extremities Pulses:  Normal pulses noted Extremities: No clubbing, cyanosis, edema or deformities noted Neurological: Alert oriented x 4, grossly nonfocal Cervical Nodes:  No significant cervical adenopathy Inguinal Nodes: No significant inguinal adenopathy Psychological:  Alert and cooperative. Normal mood and affect  Assessment and Recommendations:  1. Heme positive stool. Normal colonoscopy in January 2011. History of external hemorrhoids. Rule out colorectal  Neoplasms. The risks, benefits, and alternatives to colonoscopy with possible  biopsy and possible polypectomy were discussed with the patient and they consent to proceed.

## 2013-02-17 ENCOUNTER — Encounter: Payer: Self-pay | Admitting: Gastroenterology

## 2013-02-19 ENCOUNTER — Other Ambulatory Visit: Payer: Self-pay | Admitting: Gynecology

## 2013-02-19 ENCOUNTER — Ambulatory Visit (INDEPENDENT_AMBULATORY_CARE_PROVIDER_SITE_OTHER): Payer: 59

## 2013-02-19 DIAGNOSIS — M810 Age-related osteoporosis without current pathological fracture: Secondary | ICD-10-CM

## 2013-02-19 DIAGNOSIS — M8580 Other specified disorders of bone density and structure, unspecified site: Secondary | ICD-10-CM

## 2013-02-19 DIAGNOSIS — Z780 Asymptomatic menopausal state: Secondary | ICD-10-CM

## 2013-03-10 ENCOUNTER — Ambulatory Visit (INDEPENDENT_AMBULATORY_CARE_PROVIDER_SITE_OTHER): Payer: 59 | Admitting: Gynecology

## 2013-03-10 ENCOUNTER — Encounter: Payer: Self-pay | Admitting: Gynecology

## 2013-03-10 VITALS — BP 130/84

## 2013-03-10 DIAGNOSIS — M810 Age-related osteoporosis without current pathological fracture: Secondary | ICD-10-CM | POA: Insufficient documentation

## 2013-03-10 LAB — BUN: BUN: 20 mg/dL (ref 6–23)

## 2013-03-10 LAB — CREATININE, SERUM: CREATININE: 0.84 mg/dL (ref 0.50–1.10)

## 2013-03-10 NOTE — Patient Instructions (Signed)
Denosumab injection  What is this medicine?  DENOSUMAB (den oh sue mab) slows bone breakdown. Prolia is used to treat osteoporosis in women after menopause and in men. Xgeva is used to prevent bone fractures and other bone problems caused by cancer bone metastases. Xgeva is also used to treat giant cell tumor of the bone.  This medicine may be used for other purposes; ask your health care provider or pharmacist if you have questions.  COMMON BRAND NAME(S): Prolia, XGEVA  What should I tell my health care provider before I take this medicine?  They need to know if you have any of these conditions:  -dental disease  -eczema  -infection or history of infections  -kidney disease or on dialysis  -low blood calcium or vitamin D  -malabsorption syndrome  -scheduled to have surgery or tooth extraction  -taking medicine that contains denosumab  -thyroid or parathyroid disease  -an unusual reaction to denosumab, other medicines, foods, dyes, or preservatives  -pregnant or trying to get pregnant  -breast-feeding  How should I use this medicine?  This medicine is for injection under the skin. It is given by a health care professional in a hospital or clinic setting.  If you are getting Prolia, a special MedGuide will be given to you by the pharmacist with each prescription and refill. Be sure to read this information carefully each time.  For Prolia, talk to your pediatrician regarding the use of this medicine in children. Special care may be needed. For Xgeva, talk to your pediatrician regarding the use of this medicine in children. While this drug may be prescribed for children as young as 13 years for selected conditions, precautions do apply.  Overdosage: If you think you've taken too much of this medicine contact a poison control center or emergency room at once.  Overdosage: If you think you have taken too much of this medicine contact a poison control center or emergency room at once.  NOTE: This medicine is only for  you. Do not share this medicine with others.  What if I miss a dose?  It is important not to miss your dose. Call your doctor or health care professional if you are unable to keep an appointment.  What may interact with this medicine?  Do not take this medicine with any of the following medications:  -other medicines containing denosumab  This medicine may also interact with the following medications:  -medicines that suppress the immune system  -medicines that treat cancer  -steroid medicines like prednisone or cortisone  This list may not describe all possible interactions. Give your health care provider a list of all the medicines, herbs, non-prescription drugs, or dietary supplements you use. Also tell them if you smoke, drink alcohol, or use illegal drugs. Some items may interact with your medicine.  What should I watch for while using this medicine?  Visit your doctor or health care professional for regular checks on your progress. Your doctor or health care professional may order blood tests and other tests to see how you are doing.  Call your doctor or health care professional if you get a cold or other infection while receiving this medicine. Do not treat yourself. This medicine may decrease your body's ability to fight infection.  You should make sure you get enough calcium and vitamin D while you are taking this medicine, unless your doctor tells you not to. Discuss the foods you eat and the vitamins you take with your health care professional.    See your dentist regularly. Brush and floss your teeth as directed. Before you have any dental work done, tell your dentist you are receiving this medicine.  Do not become pregnant while taking this medicine or for 5 months after stopping it. Women should inform their doctor if they wish to become pregnant or think they might be pregnant. There is a potential for serious side effects to an unborn child. Talk to your health care professional or pharmacist for more  information.  What side effects may I notice from receiving this medicine?  Side effects that you should report to your doctor or health care professional as soon as possible:  -allergic reactions like skin rash, itching or hives, swelling of the face, lips, or tongue  -breathing problems  -chest pain  -fast, irregular heartbeat  -feeling faint or lightheaded, falls  -fever, chills, or any other sign of infection  -muscle spasms, tightening, or twitches  -numbness or tingling  -skin blisters or bumps, or is dry, peels, or red  -slow healing or unexplained pain in the mouth or jaw  -unusual bleeding or bruising  Side effects that usually do not require medical attention (Report these to your doctor or health care professional if they continue or are bothersome.):  -muscle pain  -stomach upset, gas  This list may not describe all possible side effects. Call your doctor for medical advice about side effects. You may report side effects to FDA at 1-800-FDA-1088.  Where should I keep my medicine?  This medicine is only given in a clinic, doctor's office, or other health care setting and will not be stored at home.  NOTE: This sheet is a summary. It may not cover all possible information. If you have questions about this medicine, talk to your doctor, pharmacist, or health care provider.  © 2014, [employer]/Gold Standard. (2011-07-30 12:37:47)

## 2013-03-10 NOTE — Progress Notes (Signed)
   Patient is a 63 year old who presented to the office today to discuss her bone density study. Review of patient's records indicated the following:  2008 AP spine T score of -2.2  2012 AP spine T score of -2.5 with 15.9% decreased bone mineralization when compared with study of 2010.  2014  AP spine T score of -2.5  unchanged from 2012. The remainder of the region of interest were all in the normal range.  The patient stated that many years ago she had been on Fosamax for a few years and has been off of it for over 10 years. She is currently taking her calcium and vitamin D twice a day. Her vitamin D and calcium level were normal in October of 2014. Patient is not on any hormone replacement therapy.  We discussed in detail this morning her persistent stable osteoporosis. Based on the scores her bone masses 25% below normal and she has an 8 times greater risk of a spine fracture. We discussed different treatment options to include Reclast, Prolia, Forteo, Actonel and Boniva with this risk, benefits and cons.. Patient has a history of duodenal ulcer. Her osteoporosis would be better treated with Prolia 60 mg subcutaneous every 6 months. Risk discussed: Osteonecrosis of the jaw as well as spontaneous fracture of the hip. We'll repeat a followup bone density study one year after initiating treatment to monitor results of therapy. We are going to check her calcium, BUN, creatinine, vitamin D and PTH levels today. Literature information was provided.

## 2013-03-11 LAB — PTH, INTACT AND CALCIUM
Calcium: 9.3 mg/dL (ref 8.4–10.5)
PTH: 57.5 pg/mL (ref 14.0–72.0)

## 2013-03-11 LAB — VITAMIN D 25 HYDROXY (VIT D DEFICIENCY, FRACTURES): Vit D, 25-Hydroxy: 51 ng/mL (ref 30–89)

## 2013-03-12 ENCOUNTER — Telehealth: Payer: Self-pay | Admitting: *Deleted

## 2013-03-12 NOTE — Telephone Encounter (Signed)
Prolia benefits requested today. KW CMA

## 2013-03-17 NOTE — Telephone Encounter (Signed)
Pt informed of benefits of Prolia, Subject to $1500 deductible (1043 met) 20% co insurance up to $5000 OPM (1463 met) then 100%. Therefore ~cost is $563.20. Pt agreed. Apt 03/20/13 at 10am for injection. KW

## 2013-03-19 ENCOUNTER — Ambulatory Visit (INDEPENDENT_AMBULATORY_CARE_PROVIDER_SITE_OTHER): Payer: 59 | Admitting: *Deleted

## 2013-03-19 DIAGNOSIS — M810 Age-related osteoporosis without current pathological fracture: Secondary | ICD-10-CM

## 2013-03-19 MED ORDER — DENOSUMAB 60 MG/ML ~~LOC~~ SOLN
60.0000 mg | Freq: Once | SUBCUTANEOUS | Status: AC
  Administered 2013-03-19: 60 mg via SUBCUTANEOUS

## 2013-03-20 ENCOUNTER — Ambulatory Visit: Payer: 59

## 2013-03-24 ENCOUNTER — Ambulatory Visit (AMBULATORY_SURGERY_CENTER): Payer: 59 | Admitting: Gastroenterology

## 2013-03-24 ENCOUNTER — Encounter: Payer: Self-pay | Admitting: Gastroenterology

## 2013-03-24 VITALS — BP 141/84 | HR 80 | Temp 98.6°F | Resp 16 | Ht 61.0 in | Wt 206.0 lb

## 2013-03-24 DIAGNOSIS — R195 Other fecal abnormalities: Secondary | ICD-10-CM

## 2013-03-24 MED ORDER — SODIUM CHLORIDE 0.9 % IV SOLN: 500.0000 mL | INTRAVENOUS | Status: DC

## 2013-03-24 NOTE — Patient Instructions (Signed)
YOU HAD AN ENDOSCOPIC PROCEDURE TODAY AT THE [facility]: Refer to the procedure report that was given to you for any specific questions about what was found during the examination.  If the procedure report does not answer your questions, please call your gastroenterologist to clarify.  If you requested that your care partner not be given the details of your procedure findings, then the procedure report has been included in a sealed envelope for you to review at your convenience later.  YOU SHOULD EXPECT: Some feelings of bloating in the abdomen. Passage of more gas than usual.  Walking can help get rid of the air that was put into your GI tract during the procedure and reduce the bloating. If you had a lower endoscopy (such as a colonoscopy or flexible sigmoidoscopy) you may notice spotting of blood in your stool or on the toilet paper. If you underwent a bowel prep for your procedure, then you may not have a normal bowel movement for a few days.  DIET: Your first meal following the procedure should be a light meal and then it is ok to progress to your normal diet.  A half-sandwich or bowl of soup is an example of a good first meal.  Heavy or fried foods are harder to digest and may make you feel nauseous or bloated.  Likewise meals heavy in dairy and vegetables can cause extra gas to form and this can also increase the bloating.  Drink plenty of fluids but you should avoid alcoholic beverages for 24 hours.  ACTIVITY: Your care partner should take you home directly after the procedure.  You should plan to take it easy, moving slowly for the rest of the day.  You can resume normal activity the day after the procedure however you should NOT DRIVE or use heavy machinery for 24 hours (because of the sedation medicines used during the test).    SYMPTOMS TO REPORT IMMEDIATELY: A gastroenterologist can be reached at any hour.  During normal business hours, 8:30 AM to 5:00 PM Monday through Friday,  call [phone].  After hours and on weekends, please call the GI answering service at [phone] who will take a message and have the physician on call contact you.   Following lower endoscopy (colonoscopy or flexible sigmoidoscopy):  Excessive amounts of blood in the stool  Significant tenderness or worsening of abdominal pains  Swelling of the abdomen that is new, acute  Fever of 100F or higher    FOLLOW UP: If any biopsies were taken you will be contacted by phone or by letter within the next 1-3 weeks.  Call your gastroenterologist if you have not heard about the biopsies in 3 weeks.  Our staff will call the home number listed on your records the next business day following your procedure to check on you and address any questions or concerns that you may have at that time regarding the information given to you following your procedure. This is a courtesy call and so if there is no answer at the home number and we have not heard from you through the emergency physician on call, we will assume that you have returned to your regular daily activities without incident.  SIGNATURES/CONFIDENTIALITY: You and/or your care partner have signed paperwork which will be entered into your electronic medical record.  These signatures attest to the fact that that the information above on your After Visit Summary has been reviewed and is understood.  Full responsibility of the confidentiality   of this discharge information lies with you and/or your care-partner.     

## 2013-03-24 NOTE — Op Note (Signed)
[facility] [address] [place], [id number]   COLONOSCOPY PROCEDURE REPORT  PATIENT: Suzanne Spencer, Suzanne Spencer  MR#: [id number] BIRTHDATE: Dec 21, 1950 , 62  yrs. old GENDER: Female ENDOSCOPIST: Meryl DareMalcolm T Shyana Kulakowski, MD, [facility] PROCEDURE DATE:  03/24/2013 PROCEDURE:   Colonoscopy, diagnostic First Screening Colonoscopy - Avg.  risk and is 50 yrs.  old or older - No.  Prior Negative Screening - Now for repeat screening. N/A  History of Adenoma - Now for follow-up colonoscopy & has been > or = to 3 yrs.  N/A  Polyps Removed Today? No.  Recommend repeat exam, <10 yrs? No. ASA CLASS:   Class II INDICATIONS:heme-positive stool. MEDICATIONS: MAC sedation, administered by CRNA and propofol (Diprivan) 200mg  IV DESCRIPTION OF PROCEDURE:   After the risks benefits and alternatives of the procedure were thoroughly explained, informed consent was obtained.  A digital rectal exam revealed no abnormalities of the rectum.   The LB [id number]  endoscope was introduced through the anus and advanced to the cecum, which was identified by both the appendix and ileocecal valve. No adverse events experienced.   The quality of the prep was good, using MoviPrep  The instrument was then slowly withdrawn as the colon was fully examined.  COLON FINDINGS: Mild diverticulosis was noted in the sigmoid colon. The colon was otherwise normal.  There was no diverticulosis, inflammation, polyps or cancers unless previously stated. Retroflexed views revealed small external hemorrhoids. The time to cecum=2 minutes 36 seconds.  Withdrawal time=11 minutes 56 seconds. The scope was withdrawn and the procedure completed. COMPLICATIONS: There were no complications.  ENDOSCOPIC IMPRESSION: 1.   Mild diverticulosis in the sigmoid colon 2.   Small ext hemorrhoids  RECOMMENDATIONS: 1.  High fiber diet with liberal fluid intake. 2.  You should continue to follow colorectal cancer screening guidelines for  "routine risk" patients with a repeat colonoscopy in 10 years.  There is no need for routine, screening FOBT (stool) testing for at least 5 years.  eSigned:  Meryl DareMalcolm T Hadassah Rana, MD, [facility] 03/24/2013 8:54 AM

## 2013-03-25 ENCOUNTER — Telehealth: Payer: Self-pay | Admitting: *Deleted

## 2013-03-25 NOTE — Telephone Encounter (Signed)
  Follow up Call-  Call back number 03/24/2013  Post procedure Call Back phone  # [phone]  Permission to leave phone message Yes     Patient questions:  Do you have a fever, pain , or abdominal swelling? no Pain Score  0 *  Have you tolerated food without any problems? yes  Have you been able to return to your normal activities? yes  Do you have any questions about your discharge instructions: Diet   no Medications  no Follow up visit  no  Do you have questions or concerns about your Care? no  Actions: * If pain score is 4 or above: No action needed, pain <4.

## 2013-08-05 ENCOUNTER — Other Ambulatory Visit: Payer: Self-pay

## 2013-08-31 ENCOUNTER — Encounter: Payer: Self-pay | Admitting: Family Medicine

## 2013-08-31 ENCOUNTER — Ambulatory Visit (INDEPENDENT_AMBULATORY_CARE_PROVIDER_SITE_OTHER): Payer: 59 | Admitting: Family Medicine

## 2013-08-31 VITALS — BP 126/85 | HR 93 | Temp 98.0°F | Resp 18 | Ht 62.0 in | Wt 207.0 lb

## 2013-08-31 DIAGNOSIS — IMO0002 Reserved for concepts with insufficient information to code with codable children: Secondary | ICD-10-CM

## 2013-08-31 DIAGNOSIS — M7051 Other bursitis of knee, right knee: Secondary | ICD-10-CM

## 2013-08-31 NOTE — Patient Instructions (Addendum)
No exercise for 5d except your usual walking activities around your house. Ice the area of tenderness for 30 min two times per day. Take 3 OTC ibuprofen tabs (600 mg total) two times per day with food for 1 week. If not improved then call back or return.

## 2013-08-31 NOTE — Progress Notes (Signed)
Pre visit review using our clinic review tool, if applicable. No additional management support is needed unless otherwise documented below in the visit note. 

## 2013-08-31 NOTE — Progress Notes (Signed)
OFFICE VISIT  09/04/2013   CC:  Chief Complaint  Patient presents with  . Knee Pain    pt would like cortisone shot in R knee   HPI:    Patient is a 63 y.o. Caucasian female who presents for transfer of care from Dr. Alwyn Ren (retired) and desires steroid injection in right knee. Says she's had cortisone injections in both knees in the past. Right knee began to hurt 4-5 days ago, tweeked it a bit upon standing she thinks.  Swollen a little.  Intensity of pain 8/10, motrin not much help.  Hurts to walk and to flex/extend but ROM seems pretty well intact.  No color change. Says it has been a while since she has had a cortisone shot--last one helped a lot and went several years w/out having to get another one.  Past Medical History  Diagnosis Date  . Hyperlipidemia     LDL goal = < 140  . Duodenal ulcer 1989  . Osteopenia     PMH of ( Dr Eda Paschal)  . Other and unspecified ovarian cysts   . Dysplasia of cervix, low grade (CIN 1)     S/P LEEP  . Endometrial hyperplasia     SIMPLE  . Atrophic vaginitis   . Anxiety   . Chronic headaches     tension, mirgraines  . Depression   . Allergy     SEASONAL  . Anemia   . Arthritis     KNEES  . Osteoporosis     Past Surgical History  Procedure Laterality Date  . Cholecystectomy  1989    stones  . Endoscopy upper gi  2000    negative  . Colonoscopy  2011    negative   . Tubal ligation  1987  . Adenoidectomy  1954  . Carpal tunnel release Bilateral 2001  . Abdominal surgery  2003     EXPLORATORY LAPAROTOMY, LSO  . Oophorectomy  2003    LSO WITH EXPLORATORY LAPAROTOMY  . Hysteroscopy      UTERINE POLYPS    Outpatient Prescriptions Prior to Visit  Medication Sig Dispense Refill  . Calcium Carbonate-Vitamin D 600-400 MG-UNIT per tablet Take 1 tablet by mouth 2 (two) times daily.        . clorazepate (TRANXENE) 7.5 MG tablet Take 7.5 mg by mouth once as needed.        . Multiple Vitamin (MULTIVITAMIN) tablet Take 1 tablet by  mouth daily.        Marland Kitchen PRESCRIPTION MEDICATION " HYDRO EYES"      . OLANZapine (ZYPREXA) 5 MG tablet Take 5 mg by mouth at bedtime.      Marland Kitchen venlafaxine (EFFEXOR) 75 MG tablet Take 75 mg by mouth. Take 3 every AM       No facility-administered medications prior to visit.    No Known Allergies  ROS As per HPI  PE: Blood pressure 126/85, pulse 93, temperature 98 F (36.7 C), temperature source Temporal, resp. rate 18, height 5\' 2"  (1.575 m), weight 207 lb (93.895 kg), SpO2 97.00%. Gen: Alert, well appearing.  Patient is oriented to person, place, time, and situation. Knees without erythema, warmth, or effusion.  ROM of both knees is intact.  Minimal crepitus with flexion/extension. TTP over pes anserine bursa on right knee region.  No knee instability.    LABS:  none  IMPRESSION AND PLAN:  Pes anserinus bursitis of right knee No exercise for 5d except your usual walking activities around your house.  Ice the area of tenderness for 30 min two times per day. Take 3 OTC ibuprofen tabs (600 mg total) two times per day with food for 1 week. If not improved then call back or return.   An After Visit Summary was printed and given to the patient.  FOLLOW UP: Return if symptoms worsen or fail to improve in 1 wk.

## 2013-09-04 DIAGNOSIS — M7051 Other bursitis of knee, right knee: Secondary | ICD-10-CM | POA: Insufficient documentation

## 2013-09-04 NOTE — Assessment & Plan Note (Addendum)
No exercise for 5d except your usual walking activities around your house. Ice the area of tenderness for 30 min two times per day. Take 3 OTC ibuprofen tabs (600 mg total) two times per day with food for 1 week. If not improved then call back or return. 

## 2013-09-07 ENCOUNTER — Encounter: Payer: Self-pay | Admitting: Family Medicine

## 2013-09-07 ENCOUNTER — Ambulatory Visit (INDEPENDENT_AMBULATORY_CARE_PROVIDER_SITE_OTHER): Payer: 59 | Admitting: Family Medicine

## 2013-09-07 VITALS — BP 129/82 | HR 81 | Temp 98.2°F | Resp 18 | Ht 62.0 in | Wt 207.0 lb

## 2013-09-07 DIAGNOSIS — IMO0002 Reserved for concepts with insufficient information to code with codable children: Secondary | ICD-10-CM

## 2013-09-07 DIAGNOSIS — M1710 Unilateral primary osteoarthritis, unspecified knee: Secondary | ICD-10-CM

## 2013-09-07 DIAGNOSIS — M7051 Other bursitis of knee, right knee: Secondary | ICD-10-CM

## 2013-09-07 DIAGNOSIS — M25561 Pain in right knee: Secondary | ICD-10-CM

## 2013-09-07 DIAGNOSIS — M25569 Pain in unspecified knee: Secondary | ICD-10-CM

## 2013-09-07 DIAGNOSIS — M1711 Unilateral primary osteoarthritis, right knee: Secondary | ICD-10-CM

## 2013-09-07 MED ORDER — METHYLPREDNISOLONE ACETATE 40 MG/ML IJ SUSP
40.0000 mg | Freq: Once | INTRAMUSCULAR | Status: AC
  Administered 2013-09-07: 40 mg via INTRA_ARTICULAR

## 2013-09-07 NOTE — Progress Notes (Addendum)
OFFICE NOTE  09/07/2013  CC:  Chief Complaint  Patient presents with  . Knee Pain    right knee, worsening     HPI: Patient is a 63 y.o. Caucasian female who is here for ongoing pain in right knee since I saw her and dx'd her with pes anserine bursitis 08/31/13.  Pain worse, esp with standing and walking.  Feels swollen.  Took motrin, ice, rest as recommended.    Pertinent PMH:  Past medical, surgical, social, and family history reviewed and no changes are noted since last office visit.  MEDS:  Outpatient Prescriptions Prior to Visit  Medication Sig Dispense Refill  . Calcium Carbonate-Vitamin D 600-400 MG-UNIT per tablet Take 1 tablet by mouth 2 (two) times daily.        . clorazepate (TRANXENE) 7.5 MG tablet Take 7.5 mg by mouth once as needed.        . lamoTRIgine (LAMICTAL) 200 MG tablet       . Multiple Vitamin (MULTIVITAMIN) tablet Take 1 tablet by mouth daily.        Marland Kitchen. PRESCRIPTION MEDICATION " HYDRO EYES"       No facility-administered medications prior to visit.    PE: Blood pressure 129/82, pulse 81, temperature 98.2 F (36.8 C), temperature source Temporal, resp. rate 18, height 5\' 2"  (1.575 m), weight 207 lb (93.895 kg), SpO2 99.00%. Gen: Alert, well appearing.  Patient is oriented to person, place, time, and situation. Knees: right knee with slight effusion but no warmth or erythema.  Crepitus bilat, ROM intact.  Patellar grind + on right. Mild peripatellar TTP, also prominent tenderness over pes anserine bursa on upper/medial tibia region.  No knee instability.  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 Pt desired steroid injection today in right pes anserine bursa and in right knee joint. A steroid injection was performed with right knee in sitting position/90 deg angle/anterior approach, using 2 cc of 1% plain Lidocaine and 40 mg of depo medrol.   Additionally, pes anserine bursa injected without difficulty with 1 cc 1% lidocaine w/out epi + 40 mg depo medrol.  This was well tolerated.  No immediate complications. Postprocedure care discussed.  An After Visit Summary was printed and given to the patient.  FOLLOW UP: prn

## 2013-09-07 NOTE — Addendum Note (Signed)
Addended by: Eulah PontALBRIGHT, Adrien Dietzman M on: 09/07/2013 12:34 PM   Modules accepted: Orders

## 2013-09-07 NOTE — Progress Notes (Signed)
Pre visit review using our clinic review tool, if applicable. No additional management support is needed unless otherwise documented below in the visit note. 

## 2013-09-10 ENCOUNTER — Ambulatory Visit (INDEPENDENT_AMBULATORY_CARE_PROVIDER_SITE_OTHER): Payer: 59

## 2013-09-10 ENCOUNTER — Encounter: Payer: Self-pay | Admitting: Family Medicine

## 2013-09-10 ENCOUNTER — Other Ambulatory Visit: Payer: Self-pay | Admitting: Family Medicine

## 2013-09-10 ENCOUNTER — Ambulatory Visit (INDEPENDENT_AMBULATORY_CARE_PROVIDER_SITE_OTHER): Payer: 59 | Admitting: Family Medicine

## 2013-09-10 VITALS — BP 132/82 | HR 85 | Temp 98.0°F | Resp 18 | Ht 62.0 in | Wt 206.0 lb

## 2013-09-10 DIAGNOSIS — M25561 Pain in right knee: Secondary | ICD-10-CM

## 2013-09-10 DIAGNOSIS — M1710 Unilateral primary osteoarthritis, unspecified knee: Secondary | ICD-10-CM

## 2013-09-10 DIAGNOSIS — M25469 Effusion, unspecified knee: Secondary | ICD-10-CM

## 2013-09-10 DIAGNOSIS — M1711 Unilateral primary osteoarthritis, right knee: Secondary | ICD-10-CM

## 2013-09-10 DIAGNOSIS — IMO0002 Reserved for concepts with insufficient information to code with codable children: Secondary | ICD-10-CM

## 2013-09-10 MED ORDER — MELOXICAM 15 MG PO TABS: ORAL_TABLET | ORAL | Status: DC

## 2013-09-10 MED ORDER — HYDROCODONE-ACETAMINOPHEN 5-325 MG PO TABS
ORAL_TABLET | ORAL | Status: DC
Start: 2013-09-10 — End: 2013-12-29

## 2013-09-10 NOTE — Progress Notes (Signed)
OFFICE NOTE  09/10/2013  CC:  Chief Complaint  Patient presents with  . Follow-up   HPI: Patient is a 63 y.o. Caucasian female who is here for ongoing right knee pain. I just injected her right knee and right pes anserine bursa 3 d/a.   I obtained a right knee x-ray on her earlier today and it showed mild two compartment osteoarthritis with small suprapatellar joint effusion.  We reviewed this x-ray together today. She describes initial 6 hours of improvement after injections but then pain returned to the same level as prior to the injections and has stayed the same.  Ice, ibuprofen still being used.   Pertinent PMH:  Past medical, surgical, social, and family history reviewed and no changes are noted since last office visit. +Hx of knee osteoarthritis No hx of knee or other joint surgery.  MEDS:  Outpatient Prescriptions Prior to Visit  Medication Sig Dispense Refill  . Calcium Carbonate-Vitamin D 600-400 MG-UNIT per tablet Take 1 tablet by mouth 2 (two) times daily.        . clorazepate (TRANXENE) 7.5 MG tablet Take 7.5 mg by mouth once as needed.        . lamoTRIgine (LAMICTAL) 200 MG tablet       . Multiple Vitamin (MULTIVITAMIN) tablet Take 1 tablet by mouth daily.        Marland Kitchen. venlafaxine (EFFEXOR) 37.5 MG tablet Take 37.5 mg by mouth daily.       No facility-administered medications prior to visit.   PE: Blood pressure 132/82, pulse 85, temperature 98 F (36.7 C), temperature source Temporal, resp. rate 18, height 5\' 2"  (1.575 m), weight 206 lb (93.441 kg), SpO2 96.00%. Gen: Alert, well appearing.  Patient is oriented to person, place, time, and situation. Knees: no effusion.  Mild prepatellar and peripatellar tenderness to palpation.  Mild TTP over pes anserine bursa. Mild crepitus with right knee flexion, extension.  No limitation in right knee ROM.  IMPRESSION AND PLAN:  Right knee osteoarthritis: unfortunately she has not responded to conservative mgmt + recent steroid  injection in joint + one in pes anserine bursa.  Offered PT referral and ortho referral but she declines at this time but she may take me up on this in the future.  She has a trip to [place] coming up: stop ibuprofen.   Start mobic 15mg  qd prn.  Vicodin 5/325, 1-2 q6h prn mod/severe pain, #30, no RF.   Therapeutic expectations and side effect profile of medications discussed today.  Patient's questions answered. Fitted pt with an elastic pullover knee support today in office.  An After Visit Summary was printed and given to the patient.  FOLLOW UP: prn

## 2013-09-10 NOTE — Progress Notes (Signed)
Pre visit review using our clinic review tool, if applicable. No additional management support is needed unless otherwise documented below in the visit note. 

## 2013-09-14 ENCOUNTER — Encounter: Payer: Self-pay | Admitting: Gynecology

## 2013-09-16 ENCOUNTER — Telehealth: Payer: Self-pay | Admitting: *Deleted

## 2013-09-16 NOTE — Telephone Encounter (Signed)
Pt called Prolia due after 09/16/13. Benefits are 805/20% after $1500 deductible which has been met. Pt responsible for 20%. No PA required KW CMA. Apt 09/18/13

## 2013-09-18 ENCOUNTER — Ambulatory Visit (INDEPENDENT_AMBULATORY_CARE_PROVIDER_SITE_OTHER): Payer: 59 | Admitting: Anesthesiology

## 2013-09-18 DIAGNOSIS — M810 Age-related osteoporosis without current pathological fracture: Secondary | ICD-10-CM

## 2013-09-18 MED ORDER — DENOSUMAB 60 MG/ML ~~LOC~~ SOLN
60.0000 mg | Freq: Once | SUBCUTANEOUS | Status: AC
  Administered 2013-09-18: 60 mg via SUBCUTANEOUS

## 2013-10-21 ENCOUNTER — Ambulatory Visit (INDEPENDENT_AMBULATORY_CARE_PROVIDER_SITE_OTHER): Payer: 59

## 2013-10-21 DIAGNOSIS — Z23 Encounter for immunization: Secondary | ICD-10-CM

## 2013-12-14 ENCOUNTER — Encounter: Payer: 59 | Admitting: Gynecology

## 2013-12-14 ENCOUNTER — Encounter: Payer: Self-pay | Admitting: Family Medicine

## 2013-12-15 ENCOUNTER — Encounter: Payer: 59 | Admitting: Gynecology

## 2013-12-29 ENCOUNTER — Other Ambulatory Visit (HOSPITAL_COMMUNITY)
Admission: RE | Admit: 2013-12-29 | Discharge: 2013-12-29 | Disposition: A | Discharge status: Home / Self Care | Payer: 59 | Source: Ambulatory Visit | Attending: Gynecology | Admitting: Gynecology

## 2013-12-29 ENCOUNTER — Ambulatory Visit (INDEPENDENT_AMBULATORY_CARE_PROVIDER_SITE_OTHER): Payer: 59 | Admitting: Gynecology

## 2013-12-29 ENCOUNTER — Encounter: Payer: Self-pay | Admitting: Gynecology

## 2013-12-29 VITALS — BP 136/88 | Ht 61.5 in | Wt 210.0 lb

## 2013-12-29 DIAGNOSIS — Z01419 Encounter for gynecological examination (general) (routine) without abnormal findings: Secondary | ICD-10-CM

## 2013-12-29 DIAGNOSIS — Z780 Asymptomatic menopausal state: Secondary | ICD-10-CM

## 2013-12-29 DIAGNOSIS — Z1151 Encounter for screening for human papillomavirus (HPV): Secondary | ICD-10-CM | POA: Insufficient documentation

## 2013-12-29 DIAGNOSIS — M810 Age-related osteoporosis without current pathological fracture: Secondary | ICD-10-CM

## 2013-12-29 NOTE — Progress Notes (Signed)
Suzanne CourseKathleen Spencer [birth year] [id number]   History:    63 y.o.  for annual gyn exam who in January of last year had a consultation because of her osteoporosis and had been started on Prolia every 6 months in February 2014. Her bone density study history is as follows:  2008 AP spine T score of -2.2  2012 AP spine T score of -2.5 with 15.9% decreased bone mineralization when compared with study of 2010.  2014 AP spine T score of -2.5 unchanged from 2012. The remainder of the region of interest were all in the normal range.  The patient stated that many years ago she had been on Fosamax for a few years and has been off of it for over 10 years. She is currently taking her calcium and vitamin D twice a day.She is informing that her oral surgeon has done a graft of her teeth and is planning on an implant in 3 months and was concerned about the medication that she is on with potential risk of osteonecrosis of the jaw.  Patient had a normal colonoscopy this year. Dr. Marvel PlanMcGowan 's her PCP who has been doing her blood work. 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 Her subsequent Pap smears have been normal. Her last Pap smear was in 2012. Patient had exploratory laparotomy for a benign ovarian cyst in 2003. At that time she had a left salpingo-oophorectomy.  Patient has never been on hormone replacement therapy.   Past medical history,surgical history, family history and social history were all reviewed and documented in the EPIC chart.  Gynecologic History No LMP recorded. Patient is postmenopausal. Contraception: post menopausal status Last Pap: 2012. Results were: normal Last mammogram: 2015. Results were: normal  Obstetric History OB History  Gravida Para Term Preterm AB SAB TAB Ectopic Multiple Living  1 1 1       1     # Outcome Date GA Lbr Len/2nd Weight Sex  Delivery Anes PTL Lv  1 Term                ROS: A ROS was performed and pertinent positives and negatives are included in the history.  GENERAL: No fevers or chills. HEENT: No change in vision, no earache, sore throat or sinus congestion. NECK: No pain or stiffness. CARDIOVASCULAR: No chest pain or pressure. No palpitations. PULMONARY: No shortness of breath, cough or wheeze. GASTROINTESTINAL: No abdominal pain, nausea, vomiting or diarrhea, melena or bright red blood per rectum. GENITOURINARY: No urinary frequency, urgency, hesitancy or dysuria. MUSCULOSKELETAL: No joint or muscle pain, no back pain, no recent trauma. DERMATOLOGIC: No rash, no itching, no lesions. ENDOCRINE: No polyuria, polydipsia, no heat or cold intolerance. No recent change in weight. HEMATOLOGICAL: No anemia or easy bruising or bleeding. NEUROLOGIC: No headache, seizures, numbness, tingling or weakness. PSYCHIATRIC: No depression, no loss of interest in normal activity or change in sleep pattern.     Exam: chaperone present  BP 136/88 mmHg  Ht 5' 1.5" (1.562 m)  Wt 210 lb (95.255 kg)  BMI 39.04 kg/m2  Body mass index is 39.04 kg/(m^2).  General appearance : Well developed well nourished female. No acute distress HEENT: Neck supple, trachea midline, no carotid bruits, no thyroidmegaly Lungs: Clear to auscultation, no rhonchi or wheezes, or rib retractions  Heart: Regular rate and rhythm, no murmurs or gallops Breast:Examined in sitting and supine  position were symmetrical in appearance, no palpable masses or tenderness,  no skin retraction, no nipple inversion, no nipple discharge, no skin discoloration, no axillary or supraclavicular lymphadenopathy Abdomen: no palpable masses or tenderness, no rebound or guarding Extremities: no edema or skin discoloration or tenderness  Pelvic:  Bartholin, Urethra, Skene Glands: Within normal limits             Vagina: No gross lesions or discharge, mild atrophic  changes  Cervix: No gross lesions or discharge  Uterus  anteverted, normal size, shape and consistency, non-tender and mobile  Adnexa  Without masses or tenderness  Anus and perineum  normal   Rectovaginal  normal sphincter tone without palpated masses or tenderness             Hemoccult normal colonoscopy this year     Assessment/Plan:  63 y.o. female for annual exam will be switched to a selective estrogen receptor modulator such as Evista 60 mg daily which we will start in February when she would be due for her next Prolia injection. She will have a bone density study in February and then consultation with me to discuss results and see response to at least 1 year of treatment with Prolia. Pap smear was done today. PCP we'll be doing her blood work. We discussed importance of calcium vitamin D and and regular exercise.   Ok EdwardsFERNANDEZ,Tachina Spoonemore H MD, 11:36 AM 12/29/2013

## 2013-12-29 NOTE — Patient Instructions (Signed)
Raloxifene tablets What is this medicine? RALOXIFENE (ral OX i feen) reduces the amount of calcium lost from bones. It is used to treat and prevent osteoporosis in women who have experienced menopause. This medicine may be used for other purposes; ask your health care provider or pharmacist if you have questions. COMMON BRAND NAME(S): Evista What should I tell my health care provider before I take this medicine? They need to know if you have any of these conditions: -a history of blood clots -cancer -heart failure -liver disease -premenopausal -an unusual or allergic reaction to raloxifene, other medicines, foods, dyes, or preservatives -pregnant or trying to get pregnant -breast-feeding How should I use this medicine? Take this medicine by mouth with a glass of water. Follow the directions on the prescription label. The tablets can be taken with or without food. Take your doses at regular intervals. Do not take your medicine more often than directed. Talk to your pediatrician regarding the use of this medicine in children. Special care may be needed. Overdosage: If you think you have taken too much of this medicine contact a poison control center or emergency room at once. NOTE: This medicine is only for you. Do not share this medicine with others. What if I miss a dose? If you miss a dose, take it as soon as you can. If it is almost time for your next dose, take only that dose. Do not take double or extra doses. What may interact with this medicine? -ampicillin -cholestyramine -colestipol -diazepam -diazoxide -female hormones like hormone replacement therapy -lidocaine -warfarin This list may not describe all possible interactions. Give your health care provider a list of all the medicines, herbs, non-prescription drugs, or dietary supplements you use. Also tell them if you smoke, drink alcohol, or use illegal drugs. Some items may interact with your medicine. What should I watch  for while using this medicine? Visit your doctor or health care professional for regular checks on your progress. Do not stop taking this medicine except on the advice of your doctor or health care professional. You should make sure you get enough calcium and vitamin D in your diet while you are taking this medicine. Discuss your dietary needs with your health care professional or nutritionist. Exercise may help to prevent bone loss. Discuss your exercise needs with your doctor or health care professional. This medicine can rarely cause blood clots. You should avoid long periods of bed rest while taking this medicine. If you are going to have surgery, tell your doctor or health care professional that you are taking this medicine. This medicine should be stopped at least 3 days before surgery. After surgery, it should be restarted only after you are walking again. It should not be restarted while you still need long periods of bed rest. You should not smoke while taking this medicine. Smoking may also increase your risk of blood clots. Smoking can also decrease the effects of this medicine. This medicine does not prevent hot flashes. It may cause hot flashes in some patients at the start of therapy. What side effects may I notice from receiving this medicine? Side effects that you should report to your doctor or health care professional as soon as possible: -change in vision -chest pain -difficulty breathing -leg pain or swelling -skin rash, itching Side effects that usually do not require medical attention (report to your doctor or health care professional if they continue or are bothersome): -fluid build-up -leg cramps -stomach pain -sweating This list may not   describe all possible side effects. Call your doctor for medical advice about side effects. You may report side effects to FDA at 1-800-FDA-1088. Where should I keep my medicine? Keep out of the reach of children. Store at room  temperature between 15 and 30 degrees C (59 and 86 degrees F). Throw away any unused medicine after the expiration date. NOTE: This sheet is a summary. It may not cover all possible information. If you have questions about this medicine, talk to your doctor, pharmacist, or health care provider.  2015, Elsevier/Gold Standard. (2008-01-15 15:15:14)  

## 2013-12-30 LAB — CYTOLOGY - PAP

## 2014-01-11 ENCOUNTER — Telehealth: Payer: Self-pay | Admitting: Family Medicine

## 2014-01-11 NOTE — Telephone Encounter (Signed)
Pt called requesting RF of meloxicam.  Patient last OV was 09/10/13.  Last RX was 09/10/13.  Please advise rf.

## 2014-01-12 MED ORDER — MELOXICAM 15 MG PO TABS: ORAL_TABLET | ORAL | Status: DC

## 2014-01-12 NOTE — Telephone Encounter (Signed)
RX faxed.  Pt's husband aware.  Okay per DPR.

## 2014-01-12 NOTE — Telephone Encounter (Signed)
Meloxicam rx eRx'd.

## 2014-03-09 ENCOUNTER — Telehealth: Payer: Self-pay | Admitting: Gynecology

## 2014-03-09 NOTE — Telephone Encounter (Addendum)
Phone call to pt regarding Prolia injection is due after 03/21/14  , Per patient she stated that she will not do Prolia this year due to recommendation of oral surgeon, bone graft for implant surgery . She will come in 03/2014 for bone density and consult with Dr Lily PeerFernandez. She stated that she discussed this with him at her complete exam.

## 2014-03-23 ENCOUNTER — Ambulatory Visit (INDEPENDENT_AMBULATORY_CARE_PROVIDER_SITE_OTHER): Payer: 59

## 2014-03-23 DIAGNOSIS — M810 Age-related osteoporosis without current pathological fracture: Secondary | ICD-10-CM

## 2014-03-23 DIAGNOSIS — Z780 Asymptomatic menopausal state: Secondary | ICD-10-CM

## 2014-03-23 HISTORY — PX: OTHER SURGICAL HISTORY: SHX169

## 2014-03-30 ENCOUNTER — Ambulatory Visit: Payer: 59 | Admitting: Gynecology

## 2014-03-31 ENCOUNTER — Encounter: Payer: Self-pay | Admitting: Gynecology

## 2014-03-31 ENCOUNTER — Ambulatory Visit (INDEPENDENT_AMBULATORY_CARE_PROVIDER_SITE_OTHER): Payer: 59 | Admitting: Gynecology

## 2014-03-31 VITALS — BP 138/86

## 2014-03-31 DIAGNOSIS — M8580 Other specified disorders of bone density and structure, unspecified site: Secondary | ICD-10-CM | POA: Insufficient documentation

## 2014-03-31 MED ORDER — RALOXIFENE HCL 60 MG PO TABS: 60.0000 mg | ORAL_TABLET | Freq: Every day | ORAL | Status: DC

## 2014-03-31 NOTE — Patient Instructions (Signed)
Raloxifene tablets What is this medicine? RALOXIFENE (ral OX i feen) reduces the amount of calcium lost from bones. It is used to treat and prevent osteoporosis in women who have experienced menopause. This medicine may be used for other purposes; ask your health care provider or pharmacist if you have questions. COMMON BRAND NAME(S): Evista What should I tell my health care provider before I take this medicine? They need to know if you have any of these conditions: -a history of blood clots -cancer -heart failure -liver disease -premenopausal -an unusual or allergic reaction to raloxifene, other medicines, foods, dyes, or preservatives -pregnant or trying to get pregnant -breast-feeding How should I use this medicine? Take this medicine by mouth with a glass of water. Follow the directions on the prescription label. The tablets can be taken with or without food. Take your doses at regular intervals. Do not take your medicine more often than directed. Talk to your pediatrician regarding the use of this medicine in children. Special care may be needed. Overdosage: If you think you have taken too much of this medicine contact a poison control center or emergency room at once. NOTE: This medicine is only for you. Do not share this medicine with others. What if I miss a dose? If you miss a dose, take it as soon as you can. If it is almost time for your next dose, take only that dose. Do not take double or extra doses. What may interact with this medicine? -ampicillin -cholestyramine -colestipol -diazepam -diazoxide -female hormones like hormone replacement therapy -lidocaine -warfarin This list may not describe all possible interactions. Give your health care provider a list of all the medicines, herbs, non-prescription drugs, or dietary supplements you use. Also tell them if you smoke, drink alcohol, or use illegal drugs. Some items may interact with your medicine. What should I watch  for while using this medicine? Visit your doctor or health care professional for regular checks on your progress. Do not stop taking this medicine except on the advice of your doctor or health care professional. You should make sure you get enough calcium and vitamin D in your diet while you are taking this medicine. Discuss your dietary needs with your health care professional or nutritionist. Exercise may help to prevent bone loss. Discuss your exercise needs with your doctor or health care professional. This medicine can rarely cause blood clots. You should avoid long periods of bed rest while taking this medicine. If you are going to have surgery, tell your doctor or health care professional that you are taking this medicine. This medicine should be stopped at least 3 days before surgery. After surgery, it should be restarted only after you are walking again. It should not be restarted while you still need long periods of bed rest. You should not smoke while taking this medicine. Smoking may also increase your risk of blood clots. Smoking can also decrease the effects of this medicine. This medicine does not prevent hot flashes. It may cause hot flashes in some patients at the start of therapy. What side effects may I notice from receiving this medicine? Side effects that you should report to your doctor or health care professional as soon as possible: -change in vision -chest pain -difficulty breathing -leg pain or swelling -skin rash, itching Side effects that usually do not require medical attention (report to your doctor or health care professional if they continue or are bothersome): -fluid build-up -leg cramps -stomach pain -sweating This list may not   describe all possible side effects. Call your doctor for medical advice about side effects. You may report side effects to FDA at 1-800-FDA-1088. Where should I keep my medicine? Keep out of the reach of children. Store at room  temperature between 15 and 30 degrees C (59 and 86 degrees F). Throw away any unused medicine after the expiration date. NOTE: This sheet is a summary. It may not cover all possible information. If you have questions about this medicine, talk to your doctor, pharmacist, or health care provider.  2015, Elsevier/Gold Standard. (2008-01-15 15:15:14)  

## 2014-03-31 NOTE — Progress Notes (Signed)
   Patient presented today to discuss her follow-up bone density study.  64 y.o. for annual gyn exam who in January of 2014 had a consultation because of her osteoporosis and had been started on Prolia every 6 months in February 2014. Her bone density study history is as follows:  2008 AP spine T score of -2.2  2012 AP spine T score of -2.5 with 15.9% decreased bone mineralization when compared with study of 2010.  2014 AP spine T score of -2.5 unchanged from 2012. The remainder of the region of interest were all in the normal range.  The patient stated that many years ago she had been on Fosamax for a few years and has been off of it for over 10 years. She is currently taking her calcium and vitamin D twice a day.She had informed me that her oral surgeon has done a graft of her teeth and is planning on an implant in a few months and was concerned about the medication that she is on with potential risk of osteonecrosis of the jaw.  On the last office visit at time of her annual exam on 12/29/2013 we have discussed switching her to a selective estrogen receptor modulator such as Evista 60 mg to take 1 by mouth daily but we will going to see the results of her bone density study that was done recently here in the office on 03/23/2014 demonstrated the following:  AP spine T score -0.8 (statistically significant improvement in BMD of 22%) Left femoral neck T score -1.4 (nonstatistically increase in BMD) Right femoral neck T score -0.1 (statistically significant improvement in BMD 4.7%) Distal one third left forearm T score -1.2  Assessment/plan: Patient with statistically improvement of 22% at the AP spine after 64-year-old Prolia. Due to dental work she has come off the Prolia we are going to start her on a selective receptor modulator Evista 60 mg daily. Her recent calcium and vitamin D level were normal. She was encouraged to continue to take daily oral calcium and vitamin D along with  weightbearing exercises 3-4 times a week. We'll repeat her bone density study in 2 years. The risks benefits and pros and cons of the Vistaril were discussed with the patient and literature information was provided.

## 2014-04-05 ENCOUNTER — Other Ambulatory Visit: Payer: Self-pay | Admitting: Family Medicine

## 2014-04-05 ENCOUNTER — Telehealth: Payer: Self-pay | Admitting: Family Medicine

## 2014-04-05 DIAGNOSIS — R109 Unspecified abdominal pain: Secondary | ICD-10-CM

## 2014-04-05 NOTE — Telephone Encounter (Signed)
Pt aware.  She states she stopped all NSAIDs.

## 2014-04-05 NOTE — Telephone Encounter (Signed)
Please advise 

## 2014-04-05 NOTE — Telephone Encounter (Signed)
Patient is having sharp abdominal pain, especially at night. She suspects she has an ulcer, it feels like what she had in 1989 which she did see a GI doctor for. She can not remember the physician's name but she thinks it was [facility]. Can you put in a GI referral.

## 2014-04-05 NOTE — Telephone Encounter (Signed)
Will enter referral per pt request, but also advise the patient to stop taking any NSAIDs if she is still taking these (mobic rx'd in the past, plus she was using ibuprofen in the past as well, also avoid naproxen/alleve and aspirin.  --these can all hurt her stomach)-thx

## 2014-04-12 ENCOUNTER — Telehealth: Payer: Self-pay | Admitting: Family Medicine

## 2014-04-12 NOTE — Telephone Encounter (Signed)
Patient Name: Suzanne Spencer  DOB: 23-Apr-1950    Initial Comment Caller states she is having severe pain in the knee and can she get her prescription refilled.    Nurse Assessment  Nurse: Scarlette ArStandifer, RN, Heather Date/Time (Eastern Time): 04/12/2014 11:02:02 AM  Confirm and document reason for call. If symptomatic, describe symptoms. ---Caller states she is having severe pain in the right knee, no swelling or redness  Has the patient traveled out of the country within the last 30 days? ---Not Applicable  Does the patient require triage? ---Yes  Related visit to physician within the last 2 weeks? ---No  Does the PT have any chronic conditions? (i.e. diabetes, asthma, etc.) ---Yes  List chronic conditions. ---arthritis, possible stomach ulcer, she has a GI appt soon     Guidelines    Guideline Title Affirmed Question Affirmed Notes  Knee Pain [1] MODERATE pain (e.g., interferes with normal activities, limping) AND [2] present > 3 days    Final Disposition User   See PCP When Office is Open (within 3 days) Standifer, RN, Herbert SetaHeather    Comments  Made appt for caller with Dr. Milinda CaveMcGowen tomorrow at 10:30 am.

## 2014-04-13 ENCOUNTER — Ambulatory Visit (INDEPENDENT_AMBULATORY_CARE_PROVIDER_SITE_OTHER): Payer: 59 | Admitting: Family Medicine

## 2014-04-13 ENCOUNTER — Encounter: Payer: Self-pay | Admitting: Family Medicine

## 2014-04-13 VITALS — BP 127/94 | HR 87 | Temp 98.6°F | Resp 18 | Ht 62.0 in | Wt 206.0 lb

## 2014-04-13 DIAGNOSIS — M1711 Unilateral primary osteoarthritis, right knee: Secondary | ICD-10-CM

## 2014-04-13 DIAGNOSIS — M7051 Other bursitis of knee, right knee: Secondary | ICD-10-CM

## 2014-04-13 DIAGNOSIS — M25561 Pain in right knee: Secondary | ICD-10-CM

## 2014-04-13 DIAGNOSIS — S83249A Other tear of medial meniscus, current injury, unspecified knee, initial encounter: Secondary | ICD-10-CM

## 2014-04-13 HISTORY — DX: Other tear of medial meniscus, current injury, unspecified knee, initial encounter: S83.249A

## 2014-04-13 NOTE — Progress Notes (Signed)
Pre visit review using our clinic review tool, if applicable. No additional management support is needed unless otherwise documented below in the visit note. 

## 2014-04-13 NOTE — Progress Notes (Signed)
OFFICE NOTE  04/13/2014  CC:  Chief Complaint  Patient presents with  . Knee Pain    right knee x 1 week     HPI: Patient is a 64 y.o. Caucasian female who is here for right knee pain. Has hx of mild two compartment right knee osteoarthritis (x-ray 08/2013).  I saw her for this about 8-10 mo ago and offered PT or ortho referral but she declined.  We tried mobic at that time. I injected her pes anserine bursa on right knee at one point as well and this did not help what I thought was pes anserine bursitis.  She had been doing well since that time, not requiring meloxicam. About 1 wk ago she began to have severe right knee pain, anterior and medial aspect with ? Of swelling but nothing really obvious and no redness.  She says it is difficult to walk.  No recent injury or trauma.  Pertinent PMH:  Past medical, surgical, social, and family history reviewed and no changes are noted since last office visit.  MEDS: Not taking meloxicam listed below Outpatient Prescriptions Prior to Visit  Medication Sig Dispense Refill  . Calcium Carbonate-Vitamin D 600-400 MG-UNIT per tablet Take 1 tablet by mouth 2 (two) times daily.      . clorazepate (TRANXENE) 7.5 MG tablet Take 7.5 mg by mouth once as needed.      . lamoTRIgine (LAMICTAL) 200 MG tablet     . Multiple Vitamin (MULTIVITAMIN) tablet Take 1 tablet by mouth daily.      . raloxifene (EVISTA) 60 MG tablet Take 1 tablet (60 mg total) by mouth daily. 30 tablet 11  . meloxicam (MOBIC) 15 MG tablet 1 tab po qd prn joint pain (take with food) (Patient not taking: Reported on 03/31/2014) 30 tablet 3   No facility-administered medications prior to visit.    PE: Blood pressure 127/94, pulse 87, temperature 98.6 F (37 C), temperature source Temporal, resp. rate 18, height  (1.575 m), weight 206 lb (93.441 kg), SpO2 96 %. Gen: Alert, well appearing.  Patient is oriented to person, place, time, and situation. Right knee without erythema or  warmth. Minimal ballotable patella on right knee with a hint of peripatellar soft tissue fullness on right compared to left. Normal ROM, with mild pain at full flexion and a bit more pain at full extension. Definite focal TTP over right pes anserine bursa.  No knee instability.  Quadriceps and patellar tendons nontender.  Patellar grind neg, no excessive patellar laxity.  IMPRESSION AND PLAN:  1) Right knee pain, suspect mostly pes anserine bursitis but last time she had this same scenario I injected the bursa and it didn't help.  She does have some evidence of mild knee joint effusion, + hx of osteoarth on x-ray, so we discussed doing an intra-articular right knee injection today and see if pain improved significantly.  She elected to do this. If not helpful then we'll ask Sports med MD Terrilee Files to see her for further evaluation (b/c exam supports dx of pes anserine bursitis as well).   Procedure: Therapeutic knee injection.  The patient's clinical condition is marked by substantial pain and/or significant functional disability.  Other conservative therapy has not provided relief, is contraindicated, or not appropriate.  There is a reasonable likelihood that injection will significantly improve the patient's pain and/or functional disability. Cleaned skin with alcohol swab, used anterior approach with pt sitting with knees at 90 deg flexion to enter knee joint,  Injected 1ml of depo medrol 40mg /ml and 2  ml of 1% lidocaine w/out epi into joint space without resistance.  No immediate complications.  Patient tolerated procedure well.  Post-injection care discussed, including 20 min of icing 1-2 times in the next 4-8 hours, frequent non weight-bearing ROM exercises over the next few days, and general pain medication management.  An After Visit Summary was printed and given to the patient.  FOLLOW UP: prn

## 2014-04-23 NOTE — Telephone Encounter (Signed)
Can patient have referral to Dr. Katrinka BlazingSmith for knee pain?

## 2014-04-23 NOTE — Telephone Encounter (Signed)
Dr. Terrilee FilesZach Spencer

## 2014-04-23 NOTE — Telephone Encounter (Signed)
Patient is requesting a referral to Dr. Katrinka BlazingSmith for her knee, the injection helped but it is still hurting.

## 2014-04-23 NOTE — Telephone Encounter (Signed)
Will be happy to order referral but "Dr. Katrinka BlazingSmith" is not specific enough information.  Ask patient the MD's full name and/or what office/group he/she is part of.--thx

## 2014-04-26 ENCOUNTER — Other Ambulatory Visit: Payer: Self-pay | Admitting: Family Medicine

## 2014-04-26 DIAGNOSIS — M1711 Unilateral primary osteoarthritis, right knee: Secondary | ICD-10-CM

## 2014-04-26 DIAGNOSIS — M25561 Pain in right knee: Secondary | ICD-10-CM

## 2014-04-26 NOTE — Telephone Encounter (Signed)
OK, will enter referral order.

## 2014-04-26 NOTE — Telephone Encounter (Signed)
Dr. Antoine PrimasZachary Smith at [facility] [address]

## 2014-05-03 ENCOUNTER — Encounter: Payer: Self-pay | Admitting: Gastroenterology

## 2014-05-03 ENCOUNTER — Ambulatory Visit (INDEPENDENT_AMBULATORY_CARE_PROVIDER_SITE_OTHER): Payer: 59 | Admitting: Gastroenterology

## 2014-05-03 VITALS — BP 124/60 | HR 100 | Ht 61.5 in | Wt 207.0 lb

## 2014-05-03 DIAGNOSIS — M949 Disorder of cartilage, unspecified: Secondary | ICD-10-CM | POA: Diagnosis not present

## 2014-05-03 DIAGNOSIS — R079 Chest pain, unspecified: Secondary | ICD-10-CM | POA: Diagnosis not present

## 2014-05-03 DIAGNOSIS — R1013 Epigastric pain: Secondary | ICD-10-CM

## 2014-05-03 DIAGNOSIS — R0789 Other chest pain: Secondary | ICD-10-CM

## 2014-05-03 MED ORDER — OMEPRAZOLE 40 MG PO CPDR: 40.0000 mg | DELAYED_RELEASE_CAPSULE | Freq: Every day | ORAL | Status: DC

## 2014-05-03 NOTE — Progress Notes (Signed)
History of Present Illness: This is a 64 year old female who complains of epigastric pain and lower sternal area chest pain for the past several months. Her symptoms are intermittent lasting between a few minutes in a few hours. Tend to worsen when she takes NSAIDs for frequent headaches. Symptoms have improved slightly with an over-the-counter acid reducer and Pepto-Bismol. She had an upper endoscopy in 2001 for similar symptoms which was normal and her symptoms were felt to be related to xiphoid pain. Denies weight loss, constipation, diarrhea, change in stool caliber, melena, hematochezia, nausea, vomiting, dysphagia, reflux symptoms.   No Known Allergies Outpatient Prescriptions Prior to Visit  Medication Sig Dispense Refill  . Calcium Carbonate-Vitamin D 600-400 MG-UNIT per tablet Take 1 tablet by mouth 2 (two) times daily.      . clorazepate (TRANXENE) 7.5 MG tablet Take 7.5 mg by mouth once as needed.      . lamoTRIgine (LAMICTAL) 200 MG tablet     . Multiple Vitamin (MULTIVITAMIN) tablet Take 1 tablet by mouth daily.      . raloxifene (EVISTA) 60 MG tablet Take 1 tablet (60 mg total) by mouth daily. 30 tablet 11  . meloxicam (MOBIC) 15 MG tablet 1 tab po qd prn joint pain (take with food) (Patient not taking: Reported on 03/31/2014) 30 tablet 3   No facility-administered medications prior to visit.   Past Medical History  Diagnosis Date  . Hyperlipidemia     LDL goal = < 140  . Duodenal ulcer 1989  . Osteopenia     PMH of ( Dr Eda Paschal)  . Other and unspecified ovarian cysts   . Dysplasia of cervix, low grade (CIN 1)     S/P LEEP  . Endometrial hyperplasia     SIMPLE  . Atrophic vaginitis   . Anxiety   . Chronic headaches     tension, mirgraines  . Depression   . Allergy     SEASONAL  . Anemia   . Arthritis     KNEES  . Osteoporosis   . Diverticulosis    Past Surgical History  Procedure Laterality Date  . Cholecystectomy  1989    stones  . Endoscopy upper gi   2000    negative  . Colonoscopy  2011    negative   . Tubal ligation  1987  . Adenoidectomy  1954  . Carpal tunnel release Bilateral 2001  . Abdominal surgery  2003     EXPLORATORY LAPAROTOMY, LSO  . Oophorectomy  2003    LSO WITH EXPLORATORY LAPAROTOMY  . Hysteroscopy      UTERINE POLYPS  . Mouth surgery     History   Social History  . Marital Status: Married    Spouse Name: N/A  . Number of Children: 1  . Years of Education: N/A   Occupational History  . retired    Social History Main Topics  . Smoking status: Never Smoker   . Smokeless tobacco: Never Used  . Alcohol Use: 0.6 oz/week    1 Glasses of wine per week     Comment: Rarely  . Drug Use: No  . Sexual Activity: Yes    Birth Control/ Protection: Surgical   Other Topics Concern  . None   Social History Narrative   Married, one son.   Orig from [place] area, lives in [place].   Occupation: retired from [employer]).   No tob, rare, alcohol, no drugs.   Exercise: stationary bike 6 days  a week.   Family History  Problem Relation Age of Onset  . Heart attack Father     abnormal EKG; ? AVR  . Diabetes Father   . Heart disease Father   . Hyperlipidemia Mother   . Anxiety disorder Mother   . Depression Mother   . Irregular heart beat Sister   . Heart disease Maternal Grandfather   . Heart failure Maternal Grandfather   . Osteoporosis Maternal Grandmother   . Colon cancer Neg Hx     Physical Exam: General: Well developed , well nourished, no acute distress Head: Normocephalic and atraumatic Eyes:  sclerae anicteric, EOMI Ears: Normal auditory acuity Mouth: No deformity or lesions Lungs: Clear throughout to auscultation, tenderness along lower sternum and in area of xiphoid process. Heart: Regular rate and rhythm; no murmurs, rubs or bruits Abdomen: Soft, minimal epigastric tenderness and non distended. No masses, hepatosplenomegaly or hernias noted. Normal Bowel sounds Musculoskeletal:  Symmetrical with no gross deformities  Pulses:  Normal pulses noted Extremities: No clubbing, cyanosis, edema or deformities noted Neurological: Alert oriented x 4, grossly nonfocal Psychological:  Alert and cooperative. Normal mood and affect  Assessment and Recommendations:  1. Epigastric pain, lower sternal pain, xiphoid area pain. Part of her symptoms are responsive to acid suppressants. Suspect multifactorial pain. Rule out gastritis and ulcer. Discontinue Pepto-Bismol and her over-the-counter acid reducer. Begin omeprazole 40 mg daily and Tums as needed. The risks (including bleeding, perforation, infection, missed lesions, medication reactions and possible hospitalization or surgery if complications occur), benefits, and alternatives to endoscopy with possible biopsy and possible dilation were discussed with the patient and they consent to proceed.

## 2014-05-03 NOTE — Patient Instructions (Addendum)
We have sent the following medications to your pharmacy for you to pick up at your convenience:omeprazole.  Discontinue Pepto-Bismol. You can use over the counter Tums or Maalox for breakthrough symptoms.   You have been scheduled for an endoscopy. Please follow written instructions given to you at your visit today. If you use inhalers (even only as needed), please bring them with you on the day of your procedure.  Thank you for choosing me and [facility].  Venita LickMalcolm T. Pleas KochStark, Jr., MD., Clementeen GrahamFACG  cc: Nicoletta BaPhilip McGowen, MD

## 2014-05-10 ENCOUNTER — Encounter: Payer: Self-pay | Admitting: Family Medicine

## 2014-05-10 ENCOUNTER — Other Ambulatory Visit (INDEPENDENT_AMBULATORY_CARE_PROVIDER_SITE_OTHER): Payer: 59

## 2014-05-10 ENCOUNTER — Ambulatory Visit (INDEPENDENT_AMBULATORY_CARE_PROVIDER_SITE_OTHER): Payer: 59 | Admitting: Family Medicine

## 2014-05-10 VITALS — BP 122/78 | HR 96 | Ht 61.5 in | Wt 206.0 lb

## 2014-05-10 DIAGNOSIS — M25561 Pain in right knee: Secondary | ICD-10-CM

## 2014-05-10 NOTE — Progress Notes (Signed)
Pre visit review using our clinic review tool, if applicable. No additional management support is needed unless otherwise documented below in the visit note. 

## 2014-05-10 NOTE — Progress Notes (Signed)
Suzanne Spencer [address] [place], [place] [address] Phone: [phone] Subjective:    I'm seeing this patient by the request  of:  MCGOWEN,PHILIP H, MD   CC: Right knee pain  [id number] Suzanne Spencer is a 64 y.o. female coming in with complaint of right knee pain. Patient was seen by primary care provider and was diagnosed with more of a mild osteoarthritic changes on x-ray. Patient continued to have pain and was given an injection on March 1 by primary care provider. Patient states she is actually had 2 injections. Patient states that after certain activities such as twisting and seems to be worse. Patient states that the injections work for approximately 2 weeks and then the pain came back. Patient states going up or downstairs and can hurt and sometimes if she twists in bed he can wake her up at night. Patient rates the severity of pain is 8 out of 10 when it occurs. Denies any weakness of the lower extremity. Denies any associated back pain.  Patient has had x-rays of the knee back in July 2015 showed mild to moderate osteophytic changes.      Past medical history, social, surgical and family history all reviewed in electronic medical record.  Past Medical History  Diagnosis Date  . Hyperlipidemia     LDL goal = < 140  . Duodenal ulcer 1989  . Osteopenia     PMH of ( Dr Eda Paschal)  . Other and unspecified ovarian cysts   . Dysplasia of cervix, low grade (CIN 1)     S/P LEEP  . Endometrial hyperplasia     SIMPLE  . Atrophic vaginitis   . Anxiety   . Chronic headaches     tension, mirgraines  . Depression   . Allergy     SEASONAL  . Anemia   . Arthritis     KNEES  . Osteoporosis   . Diverticulosis    Past Surgical History  Procedure Laterality Date  . Cholecystectomy  1989    stones  . Endoscopy upper gi  2000    negative  . Colonoscopy  2011    negative   . Tubal ligation  1987  . Adenoidectomy  1954  . Carpal tunnel  release Bilateral 2001  . Abdominal surgery  2003     EXPLORATORY LAPAROTOMY, LSO  . Oophorectomy  2003    LSO WITH EXPLORATORY LAPAROTOMY  . Hysteroscopy      UTERINE POLYPS  . Mouth surgery     History  Substance Use Topics  . Smoking status: Never Smoker   . Smokeless tobacco: Never Used  . Alcohol Use: 0.6 oz/week    1 Glasses of wine per week     Comment: Rarely   Family History  Problem Relation Age of Onset  . Heart attack Father     abnormal EKG; ? AVR  . Diabetes Father   . Heart disease Father   . Hyperlipidemia Mother   . Anxiety disorder Mother   . Depression Mother   . Irregular heart beat Sister   . Heart disease Maternal Grandfather   . Heart failure Maternal Grandfather   . Osteoporosis Maternal Grandmother   . Colon cancer Neg Hx       Review of Systems: No headache, visual changes, nausea, vomiting, diarrhea, constipation, dizziness, abdominal pain, skin rash, fevers, chills, night sweats, weight loss, swollen lymph nodes, body aches, joint swelling, muscle aches, chest pain, shortness of breath,  mood changes.   Objective Height 5' 1.5" (1.562 m), weight 206 lb (93.441 kg).  General: No apparent distress alert and oriented x3 mood and affect normal, dressed appropriately.  HEENT: Pupils equal, extraocular movements intact  Respiratory: Patient's speak in full sentences and does not appear short of breath  Cardiovascular: No lower extremity edema, non tender, no erythema  Skin: Warm dry intact with no signs of infection or rash on extremities or on axial skeleton.  Abdomen: Soft nontender  Neuro: Cranial nerves II through XII are intact, neurovascularly intact in all extremities with 2+ DTRs and 2+ pulses.  Lymph: No lymphadenopathy of posterior or anterior cervical chain or axillae bilaterally.  Gait normal with good balance and coordination.  MSK:  Non tender with full range of motion and good stability and symmetric strength and tone of shoulders,  elbows, wrist, hip, and ankles bilaterally.  Knee: Right Normal to inspection with no erythema or effusion or obvious bony abnormalities. Moderate tenderness over the medial joint line ROM full in flexion and extension and lower leg rotation. Ligaments with solid consistent endpoints including ACL, PCL, LCL, MCL. Positive Mcmurray's, Apley's, and Thessalonian tests. Non painful patellar compression. Patellar glide with mild crepitus. Patellar and quadriceps tendons unremarkable. Hamstring and quadriceps strength is normal.  Contralateral knee unremarkable  MSK US performed of: Right knee This study was ordered, performed, and interpreted by Terrilee FilesZach Smith D.O.  Knee: All structures visualized. Moderate hypoechoic changes over the medial joint line. Patient does have a tear in the medial meniscus. Seems to be healing slowly with no significant displacement. Increasing Doppler flow noted. Patellar Tendon unremarkable on long and transverse views without effusion. No abnormality of prepatellar bursa. LCL and MCL unremarkable on long and transverse views. No abnormality of origin of medial or lateral head of the gastrocnemius.  IMPRESSION:  Medial meniscal tear with no significant displacement.   Procedure note 97110; 15 minutes spent for Therapeutic exercises as stated in above notes.  This included exercises focusing on stretching, strengthening, with significant focus on eccentric aspects.  Knee exercises including squatting as well as wall sits, we discussed with leg extensions and the importance of vastus medialis oblique firing. We discussed the amount of repetitions as well as sets. We discussed which activities to potentially avoid such as twisting motions in a flexed state. Proper technique shown and discussed handout in great detail with ATC.  All questions were discussed and answered.     Impression and Recommendations:     This case required medical decision making of moderate  complexity.

## 2014-05-10 NOTE — Patient Instructions (Addendum)
Good to see you.  Would give you injection at end of April if really needed Ice 20 minutes 2 times daily. Usually after activity and before bed. Exercises 3 times a week.  Try the pennsaid twice daily Vitamin D 2000 IU daily Turmeric 500mg  1-2 times daily Try brace with activity See me again in 3 weeks and we can consider orthovisc as well.

## 2014-05-12 ENCOUNTER — Encounter: Payer: Self-pay | Admitting: Family Medicine

## 2014-05-20 ENCOUNTER — Encounter: Payer: Self-pay | Admitting: Gastroenterology

## 2014-05-20 ENCOUNTER — Ambulatory Visit (AMBULATORY_SURGERY_CENTER): Payer: 59 | Admitting: Gastroenterology

## 2014-05-20 VITALS — BP 111/65 | HR 84 | Temp 96.1°F | Resp 19 | Ht 61.0 in | Wt 207.0 lb

## 2014-05-20 DIAGNOSIS — R1013 Epigastric pain: Secondary | ICD-10-CM | POA: Diagnosis not present

## 2014-05-20 DIAGNOSIS — R079 Chest pain, unspecified: Secondary | ICD-10-CM

## 2014-05-20 DIAGNOSIS — K219 Gastro-esophageal reflux disease without esophagitis: Secondary | ICD-10-CM | POA: Diagnosis not present

## 2014-05-20 DIAGNOSIS — R0789 Other chest pain: Secondary | ICD-10-CM

## 2014-05-20 MED ORDER — SODIUM CHLORIDE 0.9 % IV SOLN: 500.0000 mL | INTRAVENOUS | Status: DC

## 2014-05-20 MED ORDER — OMEPRAZOLE 40 MG PO CPDR: 40.0000 mg | DELAYED_RELEASE_CAPSULE | Freq: Two times a day (BID) | ORAL | Status: DC

## 2014-05-20 NOTE — Patient Instructions (Signed)
YOU HAD AN ENDOSCOPIC PROCEDURE TODAY AT THE [facility]:   Refer to the procedure report that was given to you for any specific questions about what was found during the examination.  If the procedure report does not answer your questions, please call your gastroenterologist to clarify.  If you requested that your care partner not be given the details of your procedure findings, then the procedure report has been included in a sealed envelope for you to review at your convenience later.  YOU SHOULD EXPECT: Some feelings of bloating in the abdomen. Passage of more gas than usual.  Walking can help get rid of the air that was put into your GI tract during the procedure and reduce the bloating. If you had a lower endoscopy (such as a colonoscopy or flexible sigmoidoscopy) you may notice spotting of blood in your stool or on the toilet paper. If you underwent a bowel prep for your procedure, you may not have a normal bowel movement for a few days.  Please Note:  You might notice some irritation and congestion in your nose or some drainage.  This is from the oxygen used during your procedure.  There is no need for concern and it should clear up in a day or so.  SYMPTOMS TO REPORT IMMEDIATELY:   Following upper endoscopy (EGD)  Vomiting of blood or coffee ground material  New chest pain or pain under the shoulder blades  Painful or persistently difficult swallowing  New shortness of breath  Fever of 100F or higher  Black, tarry-looking stools  For urgent or emergent issues, a gastroenterologist can be reached at any hour by calling [phone].   DIET: Your first meal following the procedure should be a small meal and then it is ok to progress to your normal diet. Heavy or fried foods are harder to digest and may make you feel nauseous or bloated.  Likewise, meals heavy in dairy and vegetables can increase bloating.  Drink plenty of fluids but you should avoid alcoholic beverages for  24 hours.  ACTIVITY:  You should plan to take it easy for the rest of today and you should NOT DRIVE or use heavy machinery until tomorrow (because of the sedation medicines used during the test).    FOLLOW UP: Our staff will call the number listed on your records the next business day following your procedure to check on you and address any questions or concerns that you may have regarding the information given to you following your procedure. If we do not reach you, we will leave a message.  However, if you are feeling well and you are not experiencing any problems, there is no need to return our call.  We will assume that you have returned to your regular daily activities without incident.  Read all handouts given to you by your recovery room nurse.  If any biopsies were taken you will be contacted by phone or by letter within the next 1-3 weeks.  Please call us at [phone] if you have not heard about the biopsies in 3 weeks.    SIGNATURES/CONFIDENTIALITY: You and/or your care partner have signed paperwork which will be entered into your electronic medical record.  These signatures attest to the fact that that the information above on your After Visit Summary has been reviewed and is understood.  Full responsibility of the confidentiality of this discharge information lies with you and/or your care-partner.

## 2014-05-20 NOTE — Progress Notes (Signed)
Report to PACU, RN, vss, BBS= Clear.  

## 2014-05-20 NOTE — Progress Notes (Signed)
Called to room to assist during endoscopic procedure.  Patient ID and intended procedure confirmed with present staff. Received instructions for my participation in the procedure from the performing physician.  

## 2014-05-20 NOTE — Op Note (Addendum)
[facility] [address] [place], [id number]   ENDOSCOPY PROCEDURE REPORT  PATIENT: Suzanne Spencer, Suzanne Spencer  MR#: [id number] BIRTHDATE: April 21, 1950 , 63  yrs. old GENDER: female ENDOSCOPIST: Meryl DareMalcolm T Sisto Granillo, MD, Clementeen GrahamFACG REFERRED BY:  Earley FavorPhillip McGowen, M.D. PROCEDURE DATE:  05/20/2014 PROCEDURE:  EGD w/ biopsy ASA CLASS:     Class II INDICATIONS:  history of esophageal reflux, epigastric pain, and chest pain. MEDICATIONS: Monitored anesthesia care and Propofol 200 mg IV TOPICAL ANESTHETIC: none DESCRIPTION OF PROCEDURE: After the risks benefits and alternatives of the procedure were thoroughly explained, informed consent was obtained.  The LB [id number] endoscope was introduced through the mouth and advanced to the proximal second portion of the duodenum, limited by duodenal stricture.  The instrument was slowly withdrawn as the mucosa was fully examined.    ESOPHAGUS: The z-line was located 38cm from the incisors.  The z line appeared irregular and biopsies were taken. STOMACH: The mucosa of the stomach appeared normal. DUODENUM: Non-traversable and benign and intrinsic acquired stenosis was found in the 2nd part of the duodenum.   The duodenal mucosa showed no abnormalities in the duodenal bulb.  Retroflexed views revealed a hiatal hernia.     The scope was then withdrawn from the patient and the procedure completed.  COMPLICATIONS: There were no immediate complications.  ENDOSCOPIC IMPRESSION: 1.   The z-line variable located 38cm from the incisors; biopsied 2.   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   Small hiatal hernia  RECOMMENDATIONS: 1.  Await pathology results 2.  Anti-reflux regimen long term 3.  Continue PPI bid long term 4.  REV 6-8 weeks   eSigned:  Meryl DareMalcolm T Mckinleigh Schuchart, MD, [facility] 05/20/2014 9:27 AM Revised: 05/20/2014 9:27 AM

## 2014-05-21 ENCOUNTER — Telehealth: Payer: Self-pay | Admitting: *Deleted

## 2014-05-21 NOTE — Telephone Encounter (Signed)
  Follow up Call-  Call back number 05/20/2014 03/24/2013  Post procedure Call Back phone  # [phone]  Permission to leave phone message Yes Yes     Patient questions:  Do you have a fever, pain , or abdominal swelling? No. Pain Score  0 *  Have you tolerated food without any problems? Yes.    Have you been able to return to your normal activities? Yes.    Do you have any questions about your discharge instructions: Diet   No. Medications  No. Follow up visit  No.  Do you have questions or concerns about your Care? No.  Actions: * If pain score is 4 or above: No action needed, pain <4.

## 2014-05-25 ENCOUNTER — Encounter: Payer: Self-pay | Admitting: Gastroenterology

## 2014-05-31 ENCOUNTER — Encounter: Payer: Self-pay | Admitting: Family Medicine

## 2014-05-31 ENCOUNTER — Ambulatory Visit (INDEPENDENT_AMBULATORY_CARE_PROVIDER_SITE_OTHER): Payer: 59 | Admitting: Family Medicine

## 2014-05-31 VITALS — BP 126/74 | HR 101 | Ht 61.5 in | Wt 205.0 lb

## 2014-05-31 DIAGNOSIS — M1990 Unspecified osteoarthritis, unspecified site: Secondary | ICD-10-CM | POA: Diagnosis not present

## 2014-05-31 DIAGNOSIS — M1991 Primary osteoarthritis, unspecified site: Secondary | ICD-10-CM

## 2014-05-31 NOTE — Patient Instructions (Signed)
Good to see you Ice is your friend when you need it Put lotion on after the lidocaine Continue the vitamins Vitamins 2000 IU daily See me when you need me.

## 2014-05-31 NOTE — Progress Notes (Signed)
Pre visit review using our clinic review tool, if applicable. No additional management support is needed unless otherwise documented below in the visit note. 

## 2014-05-31 NOTE — Assessment & Plan Note (Signed)
Patient does have arthritis of the knee that is likely some underlying problem with patient having near healed medial meniscal tear that is degenerative in nature. Patient may have exacerbations over the course of time the patient is responding very well to conservative therapy. We discussed other home modalities patient can try follow-up on an as-needed basis.

## 2014-05-31 NOTE — Progress Notes (Signed)
Tawana ScaleZach Jeanette Rauth D.O. [facility] [address] [place], [place] [address] Phone: [phone] Subjective:    CC: Right knee pain follow-up  BJY:NWGNFAOZHYHPI:Subjective Suzanne Spencer is a 64 y.o. female coming in with complaint of right knee pain. Patient was seen by primary care provider and was diagnosed with more of a mild osteoarthritic changes on x-ray and had an injection of steroid on 04/13/2014.Marland Kitchen. Patient was then seen by me in patient did have a medial meniscal injury. Patient was given home exercises, bracing, topical anti-inflammatories and suggestions of over-the-counter natural supplementations. Patient states she is 98% better. Patient states that overall she does well with some very dull aching pain on the medial aspect of the knee. Denies any locking, or giving out on her. Patient is very happy with the results.  Patient has had x-rays of the knee back in July 2015 showed mild to moderate osteophytic changes.      Past medical history, social, surgical and family history all reviewed in electronic medical record.  Past Medical History  Diagnosis Date  . Hyperlipidemia     LDL goal = < 140  . Duodenal ulcer 1989  . Other and unspecified ovarian cysts   . Dysplasia of cervix, low grade (CIN 1)     S/P LEEP  . Endometrial hyperplasia     SIMPLE  . Atrophic vaginitis   . Anxiety   . Chronic headaches     tension, mirgraines  . Depression   . Allergy     SEASONAL  . Anemia   . Osteoarthritis     KNEES  . Osteoporosis   . Diverticulosis   . Medial meniscus tear 04/2014    R knee   Past Surgical History  Procedure Laterality Date  . Cholecystectomy  1989    stones  . Endoscopy upper gi  2000    negative  . Colonoscopy  2011    negative   . Tubal ligation  1987  . Adenoidectomy  1954  . Carpal tunnel release Bilateral 2001  . Abdominal surgery  2003     EXPLORATORY LAPAROTOMY, LSO  . Oophorectomy  2003    LSO WITH EXPLORATORY LAPAROTOMY  . Hysteroscopy     UTERINE POLYPS  . Mouth surgery     History  Substance Use Topics  . Smoking status: Never Smoker   . Smokeless tobacco: Never Used  . Alcohol Use: 0.6 oz/week    1 Glasses of wine per week     Comment: Rarely   Family History  Problem Relation Age of Onset  . Heart attack Father     abnormal EKG; ? AVR  . Diabetes Father   . Heart disease Father   . Hyperlipidemia Mother   . Anxiety disorder Mother   . Depression Mother   . Irregular heart beat Sister   . Heart disease Maternal Grandfather   . Heart failure Maternal Grandfather   . Osteoporosis Maternal Grandmother   . Colon cancer Neg Hx       Review of Systems: No headache, visual changes, nausea, vomiting, diarrhea, constipation, dizziness, abdominal pain, skin rash, fevers, chills, night sweats, weight loss, swollen lymph nodes, body aches, joint swelling, muscle aches, chest pain, shortness of breath, mood changes.   Objective Blood pressure 126/74, pulse 101, height 5' 1.5" (1.562 m), weight 205 lb (92.987 kg), SpO2 95 %.  General: No apparent distress alert and oriented x3 mood and affect normal, dressed appropriately.  HEENT: Pupils equal, extraocular movements  intact  Respiratory: Patient's speak in full sentences and does not appear short of breath  Cardiovascular: No lower extremity edema, non tender, no erythema  Skin: Warm dry intact with no signs of infection or rash on extremities or on axial skeleton.  Abdomen: Soft nontender  Neuro: Cranial nerves II through XII are intact, neurovascularly intact in all extremities with 2+ DTRs and 2+ pulses.  Lymph: No lymphadenopathy of posterior or anterior cervical chain or axillae bilaterally.  Gait normal with good balance and coordination.  MSK:  Non tender with full range of motion and good stability and symmetric strength and tone of shoulders, elbows, wrist, hip, and ankles bilaterally.  Knee: Right Normal to inspection with no erythema or effusion or obvious  bony abnormalities. Moderate tenderness over the medial joint line ROM full in flexion and extension and lower leg rotation. Ligaments with solid consistent endpoints including ACL, PCL, LCL, MCL. Negative Mcmurray's, Apley's, and Thessalonian tests. Non painful patellar compression. Patellar glide with mild crepitus. Patellar and quadriceps tendons unremarkable. Hamstring and quadriceps strength is normal.  Contralateral knee unremarkable  MSK US performed of: Right knee This study was ordered, performed, and interpreted by Terrilee Files D.O.  Knee: All structures visualized. Medial meniscal tear degenerative in nature with no hypoechoic changes with no displacement. Patellar Tendon unremarkable on long and transverse views without effusion. No abnormality of prepatellar bursa. LCL and MCL unremarkable on long and transverse views. No abnormality of origin of medial or lateral head of the gastrocnemius.  IMPRESSION:  Healing medial meniscal tear       Impression and Recommendations:     This case required medical decision making of moderate complexity.

## 2014-06-28 ENCOUNTER — Ambulatory Visit (INDEPENDENT_AMBULATORY_CARE_PROVIDER_SITE_OTHER): Payer: 59 | Admitting: Gastroenterology

## 2014-06-28 ENCOUNTER — Encounter: Payer: Self-pay | Admitting: Gastroenterology

## 2014-06-28 VITALS — BP 126/60 | HR 80 | Ht 61.5 in | Wt 209.0 lb

## 2014-06-28 DIAGNOSIS — K219 Gastro-esophageal reflux disease without esophagitis: Secondary | ICD-10-CM

## 2014-06-28 DIAGNOSIS — K315 Obstruction of duodenum: Secondary | ICD-10-CM

## 2014-06-28 NOTE — Patient Instructions (Signed)
Gastroesophageal Reflux Disease, Adult Gastroesophageal reflux disease (GERD) happens when acid from your stomach flows up into the esophagus. When acid comes in contact with the esophagus, the acid causes soreness (inflammation) in the esophagus. Over time, GERD may create small holes (ulcers) in the lining of the esophagus. CAUSES   Increased body weight. This puts pressure on the stomach, making acid rise from the stomach into the esophagus.  Smoking. This increases acid production in the stomach.  Drinking alcohol. This causes decreased pressure in the lower esophageal sphincter (valve or ring of muscle between the esophagus and stomach), allowing acid from the stomach into the esophagus.  Late evening meals and a full stomach. This increases pressure and acid production in the stomach.  A malformed lower esophageal sphincter. Sometimes, no cause is found. SYMPTOMS   Burning pain in the lower part of the mid-chest behind the breastbone and in the mid-stomach area. This may occur twice a week or more often.  Trouble swallowing.  Sore throat.  Dry cough.  Asthma-like symptoms including chest tightness, shortness of breath, or wheezing. DIAGNOSIS  Your caregiver may be able to diagnose GERD based on your symptoms. In some cases, X-rays and other tests may be done to check for complications or to check the condition of your stomach and esophagus. TREATMENT  Your caregiver may recommend over-the-counter or prescription medicines to help decrease acid production. Ask your caregiver before starting or adding any new medicines.  HOME CARE INSTRUCTIONS   Change the factors that you can control. Ask your caregiver for guidance concerning weight loss, quitting smoking, and alcohol consumption.  Avoid foods and drinks that make your symptoms worse, such as:  Caffeine or alcoholic drinks.  Chocolate.  Peppermint or mint flavorings.  Garlic and onions.  Spicy foods.  Citrus fruits,  such as oranges, lemons, or limes.  Tomato-based foods such as sauce, chili, salsa, and pizza.  Fried and fatty foods.  Avoid lying down for the 3 hours prior to your bedtime or prior to taking a nap.  Eat small, frequent meals instead of large meals.  Wear loose-fitting clothing. Do not wear anything tight around your waist that causes pressure on your stomach.  Raise the head of your bed 6 to 8 inches with wood blocks to help you sleep. Extra pillows will not help.  Only take over-the-counter or prescription medicines for pain, discomfort, or fever as directed by your caregiver.  Do not take aspirin, ibuprofen, or other nonsteroidal anti-inflammatory drugs (NSAIDs). SEEK IMMEDIATE MEDICAL CARE IF:   You have pain in your arms, neck, jaw, teeth, or back.  Your pain increases or changes in intensity or duration.  You develop nausea, vomiting, or sweating (diaphoresis).  You develop shortness of breath, or you faint.  Your vomit is green, yellow, black, or looks like coffee grounds or blood.  Your stool is red, bloody, or black. These symptoms could be signs of other problems, such as heart disease, gastric bleeding, or esophageal bleeding. MAKE SURE YOU:   Understand these instructions.  Will watch your condition.  Will get help right away if you are not doing well or get worse. Document Released: 11/08/2004 Document Revised: 04/23/2011 Document Reviewed: 08/18/2010 [facility] Patient Information 2015 [employer] [place]. This information is not intended to replace advice given to you by your health care provider. Make sure you discuss any questions you have with your health care provider.   Follow up in 1 year or as needed.  Thank you for choosing me  and [facility].  Venita LickMalcolm T. Pleas KochStark, Jr., MD., Clementeen GrahamFACG

## 2014-06-28 NOTE — Progress Notes (Signed)
    History of Present Illness: This is a 64 year old female returning for follow-up of GERD. Esophageal biopsies and an irregular Z line showed changes consistent with reflux and no evidence of Barrett's. 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 Her symptoms are under excellent control on current medications.  Current Medications, Allergies, Past Medical History, Past Surgical History, Family History and Social History were reviewed in [facility] electronic medical record.  Physical Exam: General: Well developed , well nourished, no acute distress Head: Normocephalic and atraumatic Eyes:  sclerae anicteric, EOMI Ears: Normal auditory acuity Mouth: No deformity or lesions Lungs: Clear throughout to auscultation Heart: Regular rate and rhythm; no murmurs, rubs or bruits Abdomen: Soft, non tender and non distended. No masses, hepatosplenomegaly or hernias noted. Normal Bowel sounds Musculoskeletal: Symmetrical with no gross deformities  Pulses:  Normal pulses noted Extremities: No clubbing, cyanosis, edema or deformities noted Neurological: Alert oriented x 4, grossly nonfocal Psychological:  Alert and cooperative. Normal mood and affect  Assessment and Recommendations:  1. GERD. Duodenal stricture. Continue all standard antireflux measures. Continue omeprazole 40 mg twice daily. Return office visit 1 year and as needed.  2. Colorectal cancer screening, average risk. 10 year interval surveillance colonoscopy due in 03/2023.

## 2014-10-05 ENCOUNTER — Encounter: Payer: Self-pay | Admitting: Gynecology

## 2014-10-26 ENCOUNTER — Ambulatory Visit (INDEPENDENT_AMBULATORY_CARE_PROVIDER_SITE_OTHER): Payer: Medicare Other

## 2014-10-26 DIAGNOSIS — Z23 Encounter for immunization: Secondary | ICD-10-CM

## 2014-11-09 ENCOUNTER — Ambulatory Visit: Payer: Medicare Other | Admitting: Family Medicine

## 2014-11-24 ENCOUNTER — Ambulatory Visit: Payer: Medicare Other | Admitting: Family Medicine

## 2014-12-14 ENCOUNTER — Encounter: Payer: Self-pay | Admitting: Gastroenterology

## 2014-12-31 ENCOUNTER — Encounter: Payer: 59 | Admitting: Gynecology

## 2015-01-24 ENCOUNTER — Other Ambulatory Visit: Payer: Self-pay | Admitting: *Deleted

## 2015-01-24 ENCOUNTER — Encounter: Payer: Self-pay | Admitting: Gynecology

## 2015-01-24 ENCOUNTER — Ambulatory Visit (INDEPENDENT_AMBULATORY_CARE_PROVIDER_SITE_OTHER): Payer: Medicare Other | Admitting: Gynecology

## 2015-01-24 VITALS — BP 138/76 | Ht 61.5 in | Wt 209.0 lb

## 2015-01-24 DIAGNOSIS — Z01419 Encounter for gynecological examination (general) (routine) without abnormal findings: Secondary | ICD-10-CM | POA: Diagnosis not present

## 2015-01-24 DIAGNOSIS — M8580 Other specified disorders of bone density and structure, unspecified site: Secondary | ICD-10-CM

## 2015-01-24 NOTE — Progress Notes (Signed)
Suzanne Spencer September 21, 1950 [id number]   History:    64 y.o.  for annual gyn exam who stated that for the past 3 months she's been having leg cramps. Review of her record indicated that she had been seen the office in February this year to discuss her bone density study and had been started on Evista 60 mg daily. Her bone density study history is as follows:  In 2014 she had been started on Prolia every 6 months in February 2014. Her bone density study history is as follows:  2008 AP spine T score of -2.2  2012 AP spine T score of -2.5 with 15.9% decreased bone mineralization when compared with study of 2010.  2014 AP spine T score of -2.5 unchanged from 2012. The remainder of the region of interest were all in the normal range.  The patient stated that many years ago she had been on Fosamax for a few years and has been off of it for over 10 years. She is currently taking her calcium and vitamin D twice a day.She had informed me that her oral surgeon last year which she just completed in October having oral surgery requiring graft and implants to be placed. This is the reason we had taken her off the medication and started her on the Evista.  On the last office visit at time of her annual exam on 12/29/2013 we have discussed switching her to a selective estrogen receptor modulator such as Evista 60 mg to take 1 by mouth daily but we will going to see the results of her bone density study that was done recently here in the office on 03/23/2014 demonstrated the following:  AP spine T score -0.8 (statistically significant improvement in BMD of 22%) Left femoral neck T score -1.4 (nonstatistically increase in BMD) Right femoral neck T score -0.1 (statistically significant improvement in BMD 4.7%) Distal one third left forearm T score -1.2  Patient had a statistically improvement of 22% of her bone mass at the AP spine after one year of being on Prolia.  Patient had a normal colonoscopy in  2015. Her PCP has been doing her blood work and her vaccines are up-to-date. Patient many years ago had mild dysplasia but subsequent follow-up patches of been normal.  Past medical history,surgical history, family history and social history were all reviewed and documented in the EPIC chart.  Gynecolostatistically improvement of 22% at the AP spine after 39-year-old Prolia.gic History No LMP recorded. Patient is postmenopausal. Contraception: post menopausal status Last Pap: 2015. Results were: normal  Last mammogram: 2016. Results were: normal  Obstetric History OB History  Gravida Para Term Preterm AB SAB TAB Ectopic Multiple Living  # Outcome Date GA Lbr Len/2nd Weight Sex Delivery Anes PTL Lv  1 Term                ROS: A ROS was performed and pertinent positives and negatives are included in the history.  GENERAL: No fevers or chills. HEENT: No change in vision, no earache, sore throat or sinus congestion. NECK: No pain or stiffness. CARDIOVASCULAR: No chest pain or pressure. No palpitations. PULMONARY: No shortness of breath, cough or wheeze. GASTROINTESTINAL: No abdominal pain, nausea, vomiting or diarrhea, melena or bright red blood per rectum. GENITOURINARY: No urinary frequency, urgency, hesitancy or dysuria. MUSCULOSKELETAL: No joint or muscle pain, no back pain, no recent trauma. DERMATOLOGIC: No rash, no itching, no  lesions. ENDOCRINE: No polyuria, polydipsia, no heat or cold intolerance. No recent change in weight. HEMATOLOGICAL: No anemia or easy bruising or bleeding. NEUROLOGIC: No headache, seizures, numbness, tingling or weakness. PSYCHIATRIC: No depression, no loss of interest in normal activity or change in sleep pattern.     Exam: chaperone present  BP 138/76 mmHg  Ht 5' 1.5" (1.562 m)  Wt 209 lb (94.802 kg)  BMI 38.86 kg/m2  Body mass index is 38.86 kg/(m^2).  General appearance : Well developed well nourished female. No acute  distress HEENT: Eyes: no retinal hemorrhage or exudates,  Neck supple, trachea midline, no carotid bruits, no thyroidmegaly Lungs: Clear to auscultation, no rhonchi or wheezes, or rib retractions  Heart: Regular rate and rhythm, no murmurs or gallops Breast:Examined in sitting and supine position were symmetrical in appearance, no palpable masses or tenderness,  no skin retraction, no nipple inversion, no nipple discharge, no skin discoloration, no axillary or supraclavicular lymphadenopathy Abdomen: no palpable masses or tenderness, no rebound or guarding Extremities: no edema or skin discoloration or tenderness  Pelvic:  Bartholin, Urethra, Skene Glands: Within normal limits             Vagina: No gross lesions or discharge  Cervix: No gross lesions or discharge  Uterus  anteverted, normal size, shape and consistency, non-tender and mobile  Adnexa  Without masses or tenderness  Anus and perineum  normal   Rectovaginal  normal sphincter tone without palpated masses or tenderness             Hemoccult PCP provides     Assessment/Plan:  64 y.o. female for annual exam with past history of osteoporosis had responded well on Prolia but after a year had to be discontinued due to the fact the patient was having dental surgery. She had been placed on Evista but we'll need to discontinue the fact that she was having like cramps. We will repeat her bone density study next year and determine if she needs to go back on any anti-resorptive agent or not. Meanwhile she'll continue with her calcium vitamin D and weightbearing exercises. We will check her vitamin D level today. PCP doing her blood work. Pap smear not indicated. Flu vaccine up-to-date.   Ok EdwardsFERNANDEZ,Nekayla Heider H MD, 11:50 AM 01/24/2015

## 2015-01-25 LAB — VITAMIN D 25 HYDROXY (VIT D DEFICIENCY, FRACTURES): VIT D 25 HYDROXY: 52 ng/mL (ref 30–100)

## 2015-02-25 ENCOUNTER — Encounter: Payer: Self-pay | Admitting: Gynecology

## 2015-02-25 ENCOUNTER — Ambulatory Visit (INDEPENDENT_AMBULATORY_CARE_PROVIDER_SITE_OTHER): Payer: Medicare Other | Admitting: Gynecology

## 2015-02-25 ENCOUNTER — Other Ambulatory Visit (HOSPITAL_COMMUNITY)
Admission: RE | Admit: 2015-02-25 | Discharge: 2015-02-25 | Disposition: A | Discharge status: Home / Self Care | Payer: Medicare Other | Source: Ambulatory Visit | Attending: Gynecology | Admitting: Gynecology

## 2015-02-25 VITALS — BP 128/76

## 2015-02-25 DIAGNOSIS — Z780 Asymptomatic menopausal state: Secondary | ICD-10-CM

## 2015-02-25 DIAGNOSIS — Z124 Encounter for screening for malignant neoplasm of cervix: Secondary | ICD-10-CM | POA: Insufficient documentation

## 2015-02-25 DIAGNOSIS — N930 Postcoital and contact bleeding: Secondary | ICD-10-CM | POA: Diagnosis not present

## 2015-02-25 DIAGNOSIS — N952 Postmenopausal atrophic vaginitis: Secondary | ICD-10-CM | POA: Insufficient documentation

## 2015-02-25 MED ORDER — ESTRADIOL 10 MCG VA TABS: 1.0000 | ORAL_TABLET | VAGINAL | Status: DC

## 2015-02-25 NOTE — Patient Instructions (Signed)
Estradiol vaginal tablets What is this medicine? ESTRADIOL (es tra DYE ole) vaginal tablet is used to help relieve symptoms of vaginal irritation and dryness that occurs in some women during menopause. This medicine may be used for other purposes; ask your health care provider or pharmacist if you have questions. What should I tell my health care provider before I take this medicine? They need to know if you have any of these conditions: -abnormal vaginal bleeding -blood vessel disease or blood clots -breast, cervical, endometrial, ovarian, liver, or uterine cancer -dementia -diabetes -gallbladder disease -heart disease or recent heart attack -high blood pressure -high cholesterol -high level of calcium in the blood -hysterectomy -kidney disease -liver disease -migraine headaches -protein C deficiency -protein S deficiency -stroke -systemic lupus erythematosus (SLE) -tobacco smoker -an unusual or allergic reaction to estrogens, other hormones, medicines, foods, dyes, or preservatives -pregnant or trying to get pregnant -breast-feeding How should I use this medicine? This medicine is only for use in the vagina. Do not take by mouth. Wash and dry your hands before and after use. Read package directions carefully. Unwrap the applicator package. Be sure to use a new applicator for each dose. Use at the same time each day. If the tablet has fallen out of the applicator, but is still in the package, carefully place it back into the applicator. If the tablet has fallen out of the package, that applicator should be thrown out and you should use a new applicator containing a new tablet. Lie on your back, part and bend your knees. Gently insert the applicator as far as comfortably possible into the vagina. Then, gently press the plunger until the plunger is fully depressed. This will release the tablet into the vagina. Gently remove the applicator. Throw away the applicator after use. Do not use  your medicine more often than directed. Do not stop using except on the advice of your doctor or health care professional. Talk to your pediatrician regarding the use of this medicine in children. This medicine is not approved for use in children. A patient package insert for the product will be given with each prescription and refill. Read this sheet carefully each time. The sheet may change frequently. Overdosage: If you think you have taken too much of this medicine contact a poison control center or emergency room at once. NOTE: This medicine is only for you. Do not share this medicine with others. What if I miss a dose? If you miss a dose, take it as soon as you can. If it is almost time for your next dose, take only that dose. Do not take double or extra doses. What may interact with this medicine? Do not take this medicine with any of the following medications: -aromatase inhibitors like aminoglutethimide, anastrozole, exemestane, letrozole, testolactone This medicine may also interact with the following medications: -antibiotics used to treat tuberculosis like rifabutin, rifampin and rifapentene -raloxifene or tamoxifen -warfarin This list may not describe all possible interactions. Give your health care provider a list of all the medicines, herbs, non-prescription drugs, or dietary supplements you use. Also tell them if you smoke, drink alcohol, or use illegal drugs. Some items may interact with your medicine. What should I watch for while using this medicine? Visit your health care professional for regular checks on your progress. You will need a regular breast and pelvic exam. You should also discuss the need for regular mammograms with your health care professional, and follow his or her guidelines. This medicine can make   your body retain fluid, making your fingers, hands, or ankles swell. Your blood pressure can go up. Contact your doctor or health care professional if you feel you are  retaining fluid. If you have any reason to think you are pregnant; stop taking this medicine at once and contact your doctor or health care professional. Tobacco smoking increases the risk of getting a blood clot or having a stroke, especially if you are more than 66 years old. You are strongly advised not to smoke. If you wear contact lenses and notice visual changes, or if the lenses begin to feel uncomfortable, consult your eye care specialist. If you are going to have elective surgery, you may need to stop taking this medicine beforehand. Consult your health care professional for advice prior to scheduling the surgery. What side effects may I notice from receiving this medicine? Side effects that you should report to your doctor or health care professional as soon as possible: -allergic reactions like skin rash, itching or hives, swelling of the face, lips, or tongue -breast tissue changes or discharge -changes in vision -chest pain -confusion, trouble speaking or understanding -dark urine -general ill feeling or flu-like symptoms -light-colored stools -nausea, vomiting -pain, swelling, warmth in the leg -right upper belly pain -severe headaches -shortness of breath -sudden numbness or weakness of the face, arm or leg -trouble walking, dizziness, loss of balance or coordination -unusual vaginal bleeding -yellowing of the eyes or skin Side effects that usually do not require medical attention (report to your doctor or health care professional if they continue or are bothersome): -hair loss -increased hunger or thirst -increased urination -symptoms of vaginal infection like itching, irritation or unusual discharge -unusually weak or tired This list may not describe all possible side effects. Call your doctor for medical advice about side effects. You may report side effects to FDA at 1-800-FDA-1088. Where should I keep my medicine? Keep out of the reach of children. Store at room  temperature between 15 and 30 degrees C (59 and 86 degrees F). Throw away any unused medicine after the expiration date. NOTE: This sheet is a summary. It may not cover all possible information. If you have questions about this medicine, talk to your doctor, pharmacist, or health care provider.    2016, Elsevier/Gold Standard. (2014-01-13 09:22:51)  

## 2015-02-25 NOTE — Addendum Note (Signed)
Addended by: Berna SpareASTILLO, Arlette Schaad A on: 02/25/2015 10:17 AM   Modules accepted: Orders

## 2015-02-25 NOTE — Progress Notes (Signed)
   Patient's a 65 year old that presented to the office today stating that she was experiencing bleeding after intercourse last night. Patient has been menopausal for several years patient on no hormone replacement therapy. Patient denies any vaginal discharge patient denies any GU or GI complaints.. Patient denies any nausea, vomiting, fever, chills or any back pain. Patient is married in a monogamous relationship. Patient many years ago had an abnormal Pap smear with biopsy but no treatment. Here in our office she had a normal Pap smear in 2012 and 2015. Patient stated she denied use any lubricants during intercourse in the next years any pain.  Exam: Blood pressure 128/76 Gen. appearance well-developed well nourished female with the above-mentioned complaint Pelvic: Bartholin urethra Skene glands within normal limits Vagina: Ectocervical area friable on contact. No gross lesions seen on the vaginal wall or cervix. Bimanual exam: Uterus anteverted normal size shape and consistency Adnexa: No palpable masses or tenderness Rectal exam: Not done  Assessment/plan: Postcoital bleeding probably attributed to vaginal atrophy. A Pap smear was done today result pending. It appears that her postcoital bleeding may be attributed to the vaginal atrophy areas that were bleeding were contained with silver nitrate and Monsel solution today. She is going to be started on Vagifem 10 g to apply intravaginally twice a week. She was encouraged to use some form of lubricant during intercourse. If after 4 months of being on the Vagifem she excuse any further bleeding she'll return to the office for further evaluation. She was seen here month ago for annual exam and is otherwise scheduled for return back to the office and thin at this year for annual exam or when necessary.

## 2015-03-01 LAB — CYTOLOGY - PAP

## 2015-03-07 ENCOUNTER — Encounter: Payer: Self-pay | Admitting: Family Medicine

## 2015-03-07 ENCOUNTER — Telehealth: Payer: Self-pay | Admitting: *Deleted

## 2015-03-07 MED ORDER — DIPHENOXYLATE-ATROPINE 2.5-0.025 MG PO TABS: ORAL_TABLET | ORAL | Status: DC

## 2015-03-07 NOTE — Telephone Encounter (Signed)
Pt LMOM on 03/07/15 at 8:07am stating that she has been suffering from diarrhea since Saturday (1//21/17). She stated that she is unable to leave the house due to the diarrhea. She is requesting something be called in to [facility]). Please advise. Thanks.

## 2015-03-07 NOTE — Telephone Encounter (Signed)
Generic lomotil eRx'd.

## 2015-03-07 NOTE — Telephone Encounter (Signed)
Rx faxed

## 2015-03-07 NOTE — Telephone Encounter (Signed)
Pt advised and voiced understanding.   

## 2015-03-08 ENCOUNTER — Telehealth: Payer: Self-pay | Admitting: *Deleted

## 2015-03-08 NOTE — Telephone Encounter (Signed)
May cause constipation not diarrhea. She can restart it at the end of the week. Don't feel it is related

## 2015-03-08 NOTE — Telephone Encounter (Signed)
Pt informed with the below note. 

## 2015-03-08 NOTE — Telephone Encounter (Signed)
Pt take generic vagifem 10 mcg tablets, which are now called Yuvafem inserted tablet vaginally last Friday 03/04/15, and had 3 days of severe diarrhea. No diarrhea today, read this could be a possible side effect from medication, pt is due to insert another tablet today asked if you thought she should try one more round of Yuvafem to see if this occurs again? Pt said this would be the first time this has happened. Please advise

## 2015-03-10 ENCOUNTER — Encounter: Payer: Self-pay | Admitting: Family Medicine

## 2015-03-10 ENCOUNTER — Ambulatory Visit (INDEPENDENT_AMBULATORY_CARE_PROVIDER_SITE_OTHER): Payer: Medicare Other | Admitting: Family Medicine

## 2015-03-10 VITALS — BP 124/78 | HR 95 | Temp 98.7°F | Resp 20 | Wt 204.8 lb

## 2015-03-10 DIAGNOSIS — A09 Infectious gastroenteritis and colitis, unspecified: Secondary | ICD-10-CM

## 2015-03-10 DIAGNOSIS — R197 Diarrhea, unspecified: Secondary | ICD-10-CM

## 2015-03-10 LAB — CBC WITH DIFFERENTIAL/PLATELET
BASOS ABS: 0 10*3/uL (ref 0.0–0.1)
BASOS PCT: 0.5 % (ref 0.0–3.0)
EOS ABS: 0.2 10*3/uL (ref 0.0–0.7)
Eosinophils Relative: 2.8 % (ref 0.0–5.0)
HEMATOCRIT: 38.1 % (ref 36.0–46.0)
HEMOGLOBIN: 12.3 g/dL (ref 12.0–15.0)
LYMPHS PCT: 33.8 % (ref 12.0–46.0)
Lymphs Abs: 1.9 10*3/uL (ref 0.7–4.0)
MCHC: 32.3 g/dL (ref 30.0–36.0)
MCV: 87.5 fl (ref 78.0–100.0)
MONOS PCT: 8.9 % (ref 3.0–12.0)
Monocytes Absolute: 0.5 10*3/uL (ref 0.1–1.0)
NEUTROS ABS: 3 10*3/uL (ref 1.4–7.7)
Neutrophils Relative %: 54 % (ref 43.0–77.0)
Platelets: 183 10*3/uL (ref 150.0–400.0)
RBC: 4.35 Mil/uL (ref 3.87–5.11)
RDW: 14.5 % (ref 11.5–15.5)
WBC: 5.5 10*3/uL (ref 4.0–10.5)

## 2015-03-10 LAB — BASIC METABOLIC PANEL
BUN: 17 mg/dL (ref 6–23)
CHLORIDE: 106 meq/L (ref 96–112)
CO2: 31 mEq/L (ref 19–32)
CREATININE: 0.79 mg/dL (ref 0.40–1.20)
Calcium: 9.1 mg/dL (ref 8.4–10.5)
GFR: 77.55 mL/min (ref >60.00)
GLUCOSE: 115 mg/dL — AB (ref 70–99)
POTASSIUM: 3.9 meq/L (ref 3.5–5.1)
Sodium: 143 mEq/L (ref 135–145)

## 2015-03-10 NOTE — Patient Instructions (Signed)
- Clear liquid diet for 24 hours, water, Gatorade, broth, jello etc  - No NSAID use .  - We will call you with lab results. - No pepto-bismol use, if stools remain black after discontinuation, or you get a fever, abd pain, worsening symptoms,  then we will want to see you back.    Salmonella Gastroenteritis, Adult Salmonella gastroenteritis occurs when certain bacteria infect the intestines. People usually begin to feel ill within 72 hours after the infection occurs. The illness can last from 2 days to 2 weeks. Elderly and immunocompromised people are at the greatest risk of this infection. Most people recover completely. However, salmonella bacteria can spread from the intestines to the blood and other parts of the body. In rare cases, a person may develop reactive arthritis with pain in the joints, irritation of the eyes, and painful urination. CAUSES  Salmonella gastroenteritis usually occurs after eating food or drinking liquids that are contaminated with salmonella bacteria. Common causes of this contamination include:  Poor personal hygiene.  Poor kitchen hygiene.  Drinking polluted, standing water.  Contact with carriers of the bacteria. Reptiles are strongly associated with the bacteria, but other animals may carry the bacteria as well. SIGNS AND SYMPTOMS   Nausea.  Vomiting.  Abdominal pain or cramps.  Diarrhea, which may be bloody.  Fever.  Headache. DIAGNOSIS  Your health care provider will take your medical history and perform a physical exam. A blood or stool sample may also be taken and tested for the presence of salmonella bacteria. TREATMENT  Often, no treatment is needed. However, you will need to drink plenty of fluids to prevent dehydration. In severe cases, antibiotic medicines may be given to help shorten the illness. HOME CARE INSTRUCTIONS  Drink enough fluids to keep your urine clear or pale yellow. Until your diarrhea, nausea, or vomiting is under control,  you should only drink clear liquids. Clear liquids are anything you can see through, such as water, broth, or non-caffeinated tea. Avoid:  Milk.  Fruit juice.  Alcohol.  Extremely hot or cold fluids.  If you do not have an appetite, do not force yourself to eat. However, you must continue to drink fluids.  If you have an appetite, eat a normal diet unless your health care provider tells you differently.  Eat a variety of complex carbohydrates (rice, wheat, potatoes, bread), lean meats, yogurt, fruits, and vegetables.  Avoid high-fat foods because they are more difficult to digest.  If you are dehydrated, ask your health care provider for specific rehydration instructions. Signs of dehydration may include:  Severe thirst.  Dry lips and mouth.  Dizziness.  Dark urine.  Decreasing urine frequency and amount.  Confusion.  Rapid breathing or pulse.  If you were prescribed an antibiotic medicine, finish it all even if you start to feel better.  Take medicines only as directed by your health care provider. Antidiarrheal medicines are not recommended.  Keep all follow-up visits as directed by your health care provider. PREVENTION  To prevent future salmonella infections:  Handle meat, eggs, seafood, and poultry properly.  Wash your hands and counters thoroughly after handling or preparing meat, eggs, seafood, and poultry.  Always cook meat, eggs, seafood, and poultry thoroughly.  Wash your hands thoroughly after handling animals. SEEK IMMEDIATE MEDICAL CARE IF:   You are unable to keep fluids down.  You have persistent vomiting or diarrhea.  You have abdominal pain that increases or is concentrated in one small area (localized).  Your diarrhea  contains increased blood or mucus.  You feel very weak, dizzy, thirsty, or you faint.  You lose a significant amount of weight. Your health care provider can tell you how much weight loss should concern you.  You have a  fever. MAKE SURE YOU:   Understand these instructions.  Will watch your condition.  Will get help right away if you are not doing well or get worse.   This information is not intended to replace advice given to you by your health care provider. Make sure you discuss any questions you have with your health care provider.   Document Released: 01/27/2000 Document Revised: 06/15/2014 Document Reviewed: 03/29/2011 Elsevier Interactive Patient Education [employer]

## 2015-03-10 NOTE — Progress Notes (Signed)
Patient ID: Suzanne Spencer, female   DOB: November 17, 1950, 65 y.o.   MRN: [id number]    Suzanne Spencer , [birth year], 65 y.o., female MRN: [id number]  CC: Diarrhea  Subjective: Pt presents for an acute OV with complaints of diarrhea of 5 days  duration. Associated symptoms include nausea, watery/loose stools, black stools, 3-4x a day stools. She denies fevers, chills, abdominal pain, sick contacts, weakness, antibiotic use, recent travel. She endorses middle of night awakenings 2 to have watery bowel movement. Patient states she has not had a bowel movement today and only had one watery/loose stool yesterday. Patient has tried Pepto-Bismol and Lomotil to decrease her diarrhea. She is not on iron supplementation. She does use NSAIDs routinely. She does endorse eating preprepared store-bought chicken salad the day of onset of diarrhea. Patient has had a colonoscopy 2 years ago with some mild diverticular. The sigmoid colon. She has had an EGD in 2016, and was encouraged to take high-dose PPI long-term. Biopsies of an irregular Z line are performed, and normal.  .No Known Allergies Social History  Substance Use Topics  . Smoking status: Never Smoker   . Smokeless tobacco: Never Used  . Alcohol Use: 0.6 oz/week    1 Glasses of wine per week     Comment: Rarely   Past Medical History  Diagnosis Date  . Hyperlipidemia     LDL goal = < 140  . Duodenal ulcer 1989  . Other and unspecified ovarian cysts   . Dysplasia of cervix, low grade (CIN 1)     S/P LEEP  . Endometrial hyperplasia     SIMPLE  . Atrophic vaginitis   . Anxiety   . Chronic headaches     tension, mirgraines  . Depression   . Allergy     SEASONAL  . Anemia   . Osteoarthritis     KNEES  . Osteoporosis   . Diverticulosis   . Medial meniscus tear 04/2014    R knee  . Hiatal hernia    Past Surgical History  Procedure Laterality Date  . Cholecystectomy  1989    stones  . Endoscopy upper gi  2000; 05/2014    negative 2000.   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 Russella Dar is PPI bid long term.  . Colonoscopy  2011    negative   . Tubal ligation  1987  . Adenoidectomy  1954  . Carpal tunnel release Bilateral 2001  . Abdominal surgery  2003     EXPLORATORY LAPAROTOMY, LSO  . Oophorectomy  2003    LSO WITH EXPLORATORY LAPAROTOMY  . Hysteroscopy      UTERINE POLYPS  . Mouth surgery     Family History  Problem Relation Age of Onset  . Heart attack Father     abnormal EKG; ? AVR  . Diabetes Father   . Heart disease Father   . Hyperlipidemia Mother   . Anxiety disorder Mother   . Depression Mother   . Irregular heart beat Sister   . Heart disease Maternal Grandfather   . Heart failure Maternal Grandfather   . Osteoporosis Maternal Grandmother   . Colon cancer Neg Hx      Medication List       This list is accurate as of: 03/10/15  8:25 AM.  Always use your most recent med list.  Calcium Carbonate-Vitamin D 600-400 MG-UNIT tablet  Take 1 tablet by mouth 2 (two) times daily.     ciclopirox 0.77 % cream  Commonly known as:  LOPROX  APPLY ONTO THE SKIN UNDER THE BREAST TWICE A DAY.     clorazepate 7.5 MG tablet  Commonly known as:  TRANXENE  Take 7.5 mg by mouth once as needed.     diphenoxylate-atropine 2.5-0.025 MG tablet  Commonly known as:  LOMOTIL  1-2 tabs po qid prn diarrhea     Estradiol 10 MCG Tabs vaginal tablet  Place 1 tablet (10 mcg total) vaginally 2 (two) times a week.     lamoTRIgine 200 MG tablet  Commonly known as:  LAMICTAL  Take 400 mg by mouth daily.     multivitamin tablet  Take 1 tablet by mouth daily.     OLANZapine 5 MG tablet  Commonly known as:  ZYPREXA  Take 5 mg by mouth at bedtime.     omeprazole 40 MG capsule  Commonly known as:  PRILOSEC  Take 1 capsule (40 mg total) by mouth 2 (two) times daily.     raloxifene 60 MG tablet  Commonly known as:  EVISTA  Take 1  tablet (60 mg total) by mouth daily.         ROS: Negative, with the exception of above mentioned in HPI   Objective:  BP 124/78 mmHg  Pulse 95  Temp(Src) 98.7 F (37.1 C)  Resp 20  Wt 204 lb 12 oz (92.874 kg)  SpO2 97% Body mass index is 38.07 kg/(m^2). Gen: Afebrile. No acute distress. Nontoxic in appearance, well-developed, well-nourished, Caucasian female. HENT: AT. [place]. Mildly tacky mucous membranes., no oral lesions. Bilateral nares without erythema or swelling. Throat without erythema or exudates.  Eyes:Pupils Equal Round Reactive to light, Extraocular movements intact,  Conjunctiva without redness, discharge or icterus. Neck/lymp/endocrine: Supple, no lymphadenopathy CV: RRR, no edema Chest: CTAB, no wheeze or crackles. Good air movement, normal resp effort.  Abd: Soft obese. ND. Mild tenderness lower abdomen midline, otherwise normal. BS present, and normal. No Masses palpated. No rebound or guarding. No hepatosplenomegaly Skin: No rashes, purpura or petechiae.  Neuro: Normal gait. PERLA. EOMi. Alert. Oriented x3  Psych: Normal affect, dress and demeanor. Normal speech. Normal thought content and judgment.  Assessment/Plan: Suzanne Spencer is a 65 y.o. female present for acute OV diarrhea of 5 days duration. 1. Diarrhea of presumed infectious origin - Possibly infectious diarrhea after chicken salad consumption. Concern with reports of "black stools "however patient has been taking Pepto-Bismol. However she also has a history of duodenal ulcer, but states that she has not experienced any other associated symptoms/pain. Recent EGD and colonoscopy reassuring. - She was encouraged to use a clear liquid diet over the next 24 hours, no milk products. No NSAID use or Pepto-Bismol use. Will obtain CBC and BMP today to look for signs of infection/anemia/electrolyte imbalance - Patient was encouraged to maintain hydration, appeared mildly dehydrated on exam today. - discussed black  stools sometimes can indicate either Pepto-Bismol use, iron supplementation use or intestinal bleeding. Patient was encouraged to she become symptomatic, fever, dizziness, increased pain, increased weakness etc. she is to be seen immediately. - CBC w/Diff - Basic Metabolic Panel (BMET) - Follow-up in one week, sooner if labs indicate need or patient's symptoms worsening.  Renee Claiborne Billings, DO  [facility]

## 2015-03-11 ENCOUNTER — Telehealth: Payer: Self-pay | Admitting: Family Medicine

## 2015-03-11 NOTE — Telephone Encounter (Signed)
Please call pt, her labs are normal.  

## 2015-03-11 NOTE — Telephone Encounter (Signed)
Patient aware of results.

## 2015-05-17 ENCOUNTER — Other Ambulatory Visit: Payer: Self-pay | Admitting: Gastroenterology

## 2015-06-14 ENCOUNTER — Other Ambulatory Visit: Payer: Self-pay | Admitting: Gastroenterology

## 2015-06-20 ENCOUNTER — Ambulatory Visit (INDEPENDENT_AMBULATORY_CARE_PROVIDER_SITE_OTHER): Payer: Medicare Other | Admitting: Gastroenterology

## 2015-06-20 ENCOUNTER — Encounter: Payer: Self-pay | Admitting: Gastroenterology

## 2015-06-20 VITALS — BP 130/68 | HR 84 | Ht 61.5 in | Wt 194.2 lb

## 2015-06-20 DIAGNOSIS — K315 Obstruction of duodenum: Secondary | ICD-10-CM | POA: Diagnosis not present

## 2015-06-20 DIAGNOSIS — K219 Gastro-esophageal reflux disease without esophagitis: Secondary | ICD-10-CM | POA: Diagnosis not present

## 2015-06-20 NOTE — Progress Notes (Signed)
    History of Present Illness: This is a 65 year old female returning for follow-up of GERD. Her symptoms are very well controlled on omeprazole 40 mg twice daily. EGD performed in April 2016 showed an irregular z line without evidence of Barrett's. Small hiatal hernia and duodenal stenosis was also noted. She has no GI complaints. She has several questions about the long-term safety of omeprazole.  Current Medications, Allergies, Past Medical History, Past Surgical History, Family History and Social History were reviewed in [facility] electronic medical record.  Physical Exam: General: Well developed, well nourished, no acute distress Head: Normocephalic and atraumatic Eyes:  sclerae anicteric, EOMI Ears: Normal auditory acuity Mouth: No deformity or lesions Lungs: Clear throughout to auscultation Heart: Regular rate and rhythm; no murmurs, rubs or bruits Abdomen: Soft, non tender and non distended. No masses, hepatosplenomegaly or hernias noted. Normal Bowel sounds Musculoskeletal: Symmetrical with no gross deformities  Pulses:  Normal pulses noted Extremities: No clubbing, cyanosis, edema or deformities noted Neurological: Alert oriented x 4, grossly nonfocal Psychological:  Alert and cooperative. Normal mood and affect  Assessment and Recommendations:  1. GERD. Continue standard antireflux measures and refill omeprazole 40 mg twice daily. I attempted to address all her questions regarding long-term safety of omeprazole.  2. Duodenal stenosis.  I spent 15 minutes of face-to-face time with the patient. Greater than 50% of the time was spent counseling and coordinating care.

## 2015-06-20 NOTE — Patient Instructions (Signed)
Have your pharmacy contact us when you need a refill of omeprazole.   Normal BMI (Body Mass Index- based on height and weight) is between 19 and 25. Your BMI today is Body mass index is 36.11 kg/(m^2). Marland Kitchen. Please consider follow up  regarding your BMI with your Primary Care Provider.  Thank you for choosing me and [facility].  Venita LickMalcolm T. Pleas KochStark, Jr., MD., Clementeen GrahamFACG

## 2015-06-21 ENCOUNTER — Telehealth: Payer: Self-pay | Admitting: *Deleted

## 2015-06-21 MED ORDER — RALOXIFENE HCL 60 MG PO TABS: 60.0000 mg | ORAL_TABLET | Freq: Every day | ORAL | Status: DC

## 2015-06-21 NOTE — Telephone Encounter (Signed)
Pt informed with the below, Rx sent  

## 2015-06-21 NOTE — Telephone Encounter (Signed)
If she got cramps while on it we may need to look at an alternative treatment, BUT if she wants to try again but the moment this happens will need to D/C.

## 2015-06-21 NOTE — Telephone Encounter (Signed)
Pt called stating her leg cramps have stopped she asked if you want her to restart the Evista 60 mg? This is follow up from annual exam in Dec. 2016. Please advise

## 2015-07-20 ENCOUNTER — Other Ambulatory Visit: Payer: Self-pay | Admitting: Gastroenterology

## 2015-09-13 DIAGNOSIS — M76899 Other specified enthesopathies of unspecified lower limb, excluding foot: Secondary | ICD-10-CM

## 2015-09-13 HISTORY — DX: Other specified enthesopathies of unspecified lower limb, excluding foot: M76.899

## 2015-09-26 ENCOUNTER — Telehealth: Payer: Self-pay | Admitting: *Deleted

## 2015-09-26 NOTE — Telephone Encounter (Signed)
Pt takes generic vagifem vaginal tablets has noticed spotting weekly after inserting the tablets,I advised pt to schedule office visit with provider for exam as pt said several times spotting has became heavier. Transferred to front desk

## 2015-09-27 ENCOUNTER — Ambulatory Visit (INDEPENDENT_AMBULATORY_CARE_PROVIDER_SITE_OTHER): Payer: Medicare Other | Admitting: Gynecology

## 2015-09-27 ENCOUNTER — Telehealth: Payer: Self-pay

## 2015-09-27 ENCOUNTER — Encounter: Payer: Self-pay | Admitting: Gynecology

## 2015-09-27 VITALS — BP 118/78

## 2015-09-27 DIAGNOSIS — N950 Postmenopausal bleeding: Secondary | ICD-10-CM

## 2015-09-27 DIAGNOSIS — N952 Postmenopausal atrophic vaginitis: Secondary | ICD-10-CM | POA: Diagnosis not present

## 2015-09-27 DIAGNOSIS — Z79890 Hormone replacement therapy (postmenopausal): Secondary | ICD-10-CM

## 2015-09-27 HISTORY — PX: ENDOMETRIAL BIOPSY: SHX622

## 2015-09-27 MED ORDER — ESTRADIOL 10 MCG VA TABS
ORAL_TABLET | VAGINAL | 6 refills | Status: DC
Start: 2015-09-27 — End: 2016-04-17

## 2015-09-27 NOTE — Patient Instructions (Signed)

## 2015-09-27 NOTE — Telephone Encounter (Signed)
Please call in prescription for Uvafem 10 g to apply intravaginally daily at bedtime for 7 days and then twice a week thereafter. #14 refill 10

## 2015-09-27 NOTE — Telephone Encounter (Signed)
Patient called back and said after speaking with pharmacist she thought she had better get Dr. Glenetta HewJF to send Rx in with the new instructions from today. Rx sent per Dr. Manuela SchwartzJf's note.

## 2015-09-27 NOTE — Telephone Encounter (Signed)
Patient was in earlier this morning to see Dr. Glenetta HewJF. She said he instructed her to use her Uvafem daily for one week and then back to twice weekly. She questioned initially if she might come up short and not be able to get refill on time when needed but after checking her supply at home she thinks she will be fine. I told her nothing was prescribed today and we will not be sending Rx at her request.

## 2015-09-27 NOTE — Addendum Note (Signed)
Addended by: Kem ParkinsonBARNES, Danahi Reddish on: 09/27/2015 10:13 AM   Modules accepted: Orders

## 2015-09-27 NOTE — Progress Notes (Signed)
   HPI: Patient is a 65 year old who the beginning of this year's a result of vaginal atrophy and postcoital bleeding had been started on Vagifem 10 g intravaginally twice a week. She has done well and the past few weeks she stated when she inserts and removes the applicator after applying the Vagifem she has noted some blood on the applicator. She is not on any other hormone replacement therapy. She denies any GU or GI complaints. No fever, chills, nausea, vomiting.   ROS: A ROS was performed and pertinent positives and negatives are included in the history.  GENERAL: No fevers or chills. HEENT: No change in vision, no earache, sore throat or sinus congestion. NECK: No pain or stiffness. CARDIOVASCULAR: No chest pain or pressure. No palpitations. PULMONARY: No shortness of breath, cough or wheeze. GASTROINTESTINAL: No abdominal pain, nausea, vomiting or diarrhea, melena or bright red blood per rectum. GENITOURINARY: No urinary frequency, urgency, hesitancy or dysuria. MUSCULOSKELETAL: No joint or muscle pain, no back pain, no recent trauma. DERMATOLOGIC: No rash, no itching, no lesions. ENDOCRINE: No polyuria, polydipsia, no heat or cold intolerance. No recent change in weight. HEMATOLOGICAL: No anemia or easy bruising or bleeding. NEUROLOGIC: No headache, seizures, numbness, tingling or weakness. PSYCHIATRIC: No depression, no loss of interest in normal activity or change in sleep pattern.   PE: Blood pressure 118/78 Gen. appearance well-developed nourished female in no acute distress with the above-mentioned complaint. She describes no bleeding today. Pelvic: Bartholin urethra Skene was within normal limits Vagina: Slightly friable vaginal mucosa but no gross lesions on inspection Cervix: No gross lesions on inspection Uterus: Anteverted normal size shape and consistency Adnexa: No palpable mass or tenderness Rectal exam: Not done  Patient was counseled for an endometrial biopsy. The cervix was  cleansed with Betadine solution. A sterile Pipelle was introduced into the uterine cavity. Uterus sounded to 6-1/2 cm several passes were required in effort to obtain some tissue submitted for histological evaluation. Patient tolerated procedure well.   Assessment Plan: 65 year old with history of vaginal atrophy on Vagifem 10 g twice a week and noticed some blood on the applicator. An endometrial biopsy was obtained to rule out the possibility bleeding coming from the uterine cavity. Patient did have a normal Pap smear this year. I'm going to instructed to use the Vagifem 10 g daily at bedtime for 1 week and was instructed to abstain from intercourse and then go back to twice a week in an effort to build up her atrophic vaginal mucosa. If she continues with bleeding issue will order a sonohysterogram.    Greater than 50% of time was spent in counseling and coordinating care of this patient.   Time of consultation: 25   Minutes.

## 2015-10-02 ENCOUNTER — Encounter: Payer: Self-pay | Admitting: Family Medicine

## 2015-10-03 ENCOUNTER — Ambulatory Visit: Payer: Medicare Other | Admitting: Family Medicine

## 2015-10-08 NOTE — Progress Notes (Signed)
[name] [address] [place], [place] [address] Phone: [phone] Subjective:    FA:OZHY knee pain  QMV:[id number]  Suzanne Spencer is a 65 y.o. female coming in with complaint of right knee pain. Patient was seen by primary care provider and was diagnosed with more of a mild osteoarthritic changes on x-ray and had an injection of steroid on 04/13/2014.Marland Kitchen Patient was then seen by me and patient did have a medial meniscal injury. Was healed 1 1/2 years ago.   Patient is having more of a left knee pain. Patient discusses a dull, throbbing aching sensation.  Patient has had x-rays of the knee back in July 2015 showed mild to moderate osteophytic changes.  Patient now states having more pain on the left knee. Describes the pain as a dull, throbbing aching sensation. States it is more on the posterior lateral aspect. Different than her contralateral side. Patient states that it seems to get better with activity and then seems to have tightness afterwards. Denies any significant radiation but feels it is in the calf as well as goes up to the hamstring. Patient was told by a neighbor that this could be a cyst or a clot and was told that she should be seen. Denies any significant swelling, denies any shortness of breath or any chest pain. Patient though is on estrogen replacement therapy.    Past medical history, social, surgical and family history all reviewed in electronic medical record.  Past Medical History:  Diagnosis Date  . Allergy    SEASONAL  . Anemia   . Anxiety   . Atrophic vaginitis   . Chronic headaches    tension, mirgraines  . Depression   . Diverticulosis   . Duodenal ulcer 1989  . Dysplasia of cervix, low grade (CIN 1)    S/P LEEP  . Endometrial hyperplasia    SIMPLE  . Hiatal hernia   . Hyperlipidemia    LDL goal = < 140  . Medial meniscus tear 04/2014   R knee  . Osteoarthritis    KNEES  . Osteoporosis   . Other and unspecified  ovarian cysts    Past Surgical History:  Procedure Laterality Date  . ABDOMINAL SURGERY  2003    EXPLORATORY LAPAROTOMY, LSO  . ADENOIDECTOMY  1954  . CARPAL TUNNEL RELEASE Bilateral 2001  . CHOLECYSTECTOMY  1989   stones  . COLONOSCOPY  2011   negative   . ENDOMETRIAL BIOPSY  09/27/2015   PATH:  BENIGN  . endoscopy upper GI  2000; 05/2014   negative 2000.  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 Russella Dar is PPI bid long term.  Marland Kitchen HYSTEROSCOPY     UTERINE POLYPS  . MOUTH SURGERY    . OOPHORECTOMY  2003   LSO WITH EXPLORATORY LAPAROTOMY  . TUBAL LIGATION  1987   Social History  Substance Use Topics  . Smoking status: Never Smoker  . Smokeless tobacco: Never Used  . Alcohol use 0.6 oz/week    1 Glasses of wine per week     Comment: Rarely   Family History  Problem Relation Age of Onset  . Heart attack Father     abnormal EKG; ? AVR  . Diabetes Father   . Heart disease Father   . Hyperlipidemia Mother   . Anxiety disorder Mother   . Depression Mother   . Irregular heart beat  Sister   . Heart disease Maternal Grandfather   . Heart failure Maternal Grandfather   . Osteoporosis Maternal Grandmother   . Colon cancer Neg Hx       Review of Systems: No headache, visual changes, nausea, vomiting, diarrhea, constipation, dizziness, abdominal pain, skin rash, fevers, chills, night sweats, weight loss, swollen lymph nodes, body aches, joint swelling, muscle aches, chest pain, shortness of breath, mood changes.   Objective  There were no vitals taken for this visit.  General: No apparent distress alert and oriented x3 mood and affect normal, dressed appropriately.  HEENT: Pupils equal, extraocular movements intact  Respiratory: Patient's speak in full sentences and does not appear short of breath  Cardiovascular: No lower extremity edema, non tender, no erythema  Skin: Warm dry intact with no signs of  infection or rash on extremities or on axial skeleton.  Abdomen: Soft nontender  Neuro: Cranial nerves II through XII are intact, neurovascularly intact in all extremities with 2+ DTRs and 2+ pulses.  Lymph: No lymphadenopathy of posterior or anterior cervical chain or axillae bilaterally.  Gait normal with good balance and coordination.  MSK:  Non tender with full range of motion and good stability and symmetric strength and tone of shoulders, elbows, wrist, hip, and ankles bilaterally.  Knee: Left Normal to inspection with no erythema or effusion or obvious bony abnormalities. Minimal tenderness over the medial joint line but more tenderness over the lateral hamstring distally. ROM full in flexion and extension and lower leg rotation. Ligaments with solid consistent endpoints including ACL, PCL, LCL, MCL. Negative Mcmurray's, Apley's, and Thessalonian tests. Non painful patellar compression. Patellar glide with mild crepitus. Patellar and quadriceps tendons unremarkable. Hamstring and quadriceps strength is normal.  Contralateral knee unremarkable  MSK US performed of: Left This study was ordered, performed, and interpreted by Terrilee FilesZach Doyce Saling D.O.  Knee: All structures visualized. Anteromedial, anterolateral, posteromedial, and posterolateral menisci unremarkable without tearing, fraying, effusion, or displacement. Patellar Tendon unremarkable on long and transverse views without effusion. Hamstring tendon laterally does have hypoechoic changes and increasing Doppler flow right near the insertion. Scar tissue formation also noted.  IMPRESSION:  Chronic hamstring tendinopathy        Impression and Recommendations:     This case required medical decision making of moderate complexity.

## 2015-10-10 ENCOUNTER — Ambulatory Visit: Payer: Self-pay

## 2015-10-10 ENCOUNTER — Ambulatory Visit (INDEPENDENT_AMBULATORY_CARE_PROVIDER_SITE_OTHER): Payer: Medicare Other | Admitting: Family Medicine

## 2015-10-10 ENCOUNTER — Encounter: Payer: Self-pay | Admitting: Family Medicine

## 2015-10-10 ENCOUNTER — Ambulatory Visit (HOSPITAL_COMMUNITY)
Admission: RE | Admit: 2015-10-10 | Discharge: 2015-10-10 | Disposition: A | Discharge status: Home / Self Care | Payer: Medicare Other | Source: Ambulatory Visit | Attending: Urology | Admitting: Urology

## 2015-10-10 VITALS — BP 128/68 | HR 98 | Ht 61.0 in | Wt 186.0 lb

## 2015-10-10 DIAGNOSIS — M25562 Pain in left knee: Secondary | ICD-10-CM

## 2015-10-10 DIAGNOSIS — E785 Hyperlipidemia, unspecified: Secondary | ICD-10-CM | POA: Insufficient documentation

## 2015-10-10 DIAGNOSIS — M76892 Other specified enthesopathies of left lower limb, excluding foot: Secondary | ICD-10-CM | POA: Insufficient documentation

## 2015-10-10 DIAGNOSIS — M79605 Pain in left leg: Secondary | ICD-10-CM

## 2015-10-10 DIAGNOSIS — M65852 Other synovitis and tenosynovitis, left thigh: Secondary | ICD-10-CM | POA: Diagnosis not present

## 2015-10-10 MED ORDER — DICLOFENAC SODIUM 1 % TD GEL: 2.0000 g | Freq: Four times a day (QID) | TRANSDERMAL | 11 refills | Status: DC
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
## 2015-10-10 NOTE — Patient Instructions (Signed)
Good to see you- you look great! I do not think it is a clot but with you on estrogen we will get it checked out.  Thigh compression sleeve with activity and 30 minutes afterward Ice 20 minutes 2 times daily. Usually after activity and before bed. Exercises 3 times a week.  Voltaren gel 2 times daily for pain .  Heel lift in shoe with working out.  Make sure your leg has a 30 degree bend in it at the bottom of your peddle stroke.  If not then lower your seat one level.  See me again in 3-4 weeks.

## 2015-10-11 ENCOUNTER — Encounter: Payer: Self-pay | Admitting: Family Medicine

## 2015-10-13 ENCOUNTER — Ambulatory Visit (INDEPENDENT_AMBULATORY_CARE_PROVIDER_SITE_OTHER): Payer: Medicare Other | Admitting: Family Medicine

## 2015-10-13 ENCOUNTER — Encounter: Payer: Self-pay | Admitting: Family Medicine

## 2015-10-13 VITALS — BP 122/74 | HR 89 | Temp 98.6°F | Resp 16 | Ht 61.0 in | Wt 185.1 lb

## 2015-10-13 DIAGNOSIS — Z0000 Encounter for general adult medical examination without abnormal findings: Secondary | ICD-10-CM | POA: Diagnosis not present

## 2015-10-13 DIAGNOSIS — Z1159 Encounter for screening for other viral diseases: Secondary | ICD-10-CM | POA: Diagnosis not present

## 2015-10-13 DIAGNOSIS — Z23 Encounter for immunization: Secondary | ICD-10-CM | POA: Diagnosis not present

## 2015-10-13 NOTE — Progress Notes (Signed)
WELCOME TO MEDICARE (IPPE) VISIT I explained that today's visit was for the purpose of health promotion and disease detection, as well as an introduction to Medicare and it's covered benefits.  I explained that no labs or other services would be performed today, but if any were determined to be necessary then appropriate orders/referrals would be arranged for these to be done at a future date.  Patient is a 65 y/o white female who is already an established patient with me.  Pt's medical and social history were reviewed. Specifically, we reviewed PMH/PSH/Meds/FH.  Also reviewed alcohol, tobacco, and illicit drug use.  Diet and physical activity reviewed.  Doing great with diet/exercise: lost 20 lbs or so over the last 6 mo purposefully. All of this info is also found in the appropriate sections of pt's EMR.  Pt was screened with appropriate screening instrument for depression.  Current or past experiences with mood disorders was discussed.  Pt carries dx of mood disorder and regularly sees her psychiatrist Dr. Evelene Croon in [place].   Pt's functional ability and level of safety were reviewed. Specifically, I screened for hearing impairment and fall risk.  I assessed home safety and we discussed pt's competency with activities of daily living.  No hearing impairment.  Fall Risk  10/13/2015  Falls in the past year? No     EXAM: Vitals:   10/13/15 1255  BP: 122/74  Pulse: 89  Resp: 16  Temp: 98.6 F (37 C)   Body mass index is 34.98 kg/m.   Visual acuity screen:  Pt gets routine eye care through optometrist/ophthalmologist and is up to date with follow up care with this provider.  [facility]. No additional physical exam required or indicated today.  End of life planning: Advanced directives and power of attorney information specific to the patient were discussed.  Pt has HC POA and living will in place.  Education, counseling, and referrals based on the information obtained/reviewed  today: none  Education, counseling, and referral for other preventive services: Written checklist was completed and given to pt for obtaining, as appropriate, the other preventive services that are covered as separate Medicare Part B benefits. Possible services that were reviewed with pt are:  -annual wellness visit (AWV)-doing currently. -Bone mass measurements: osteopenic on past DEXA 03/23/14.  Prolia x 1-2 injections, then switched to Evista.  Her GYN plans on doing f/u DEXA 01/2016. -Cardiovascular screening blood tests: pt gets lipid screening q42mo and these have been normal. -Colorectal cancer screening: 2011 colonoscopy normal.  Repeat 03/2013 (diagnostic) showed no polyps.  Repeat recommended after 03/2023. -Counseling to prevent tobacco use: n/a -Diabetes screening tests: pt to get routine blood work with her psychiatrist in the next couple months. -Diabetes self-management training (DSMT): n/a -Glaucoma screening: n/a -HIV screening: declines -Hep C screening: pt agrees to this ---will do today. -Medical nutrition therapy: n/a -Prostate cancer screening: n/a -Seasonal influenza, pneumococcal, and Hep B vaccines: will give prevnar 13 today.  She'll return in Oct for flu vaccine. -screening mammography: pt UTD -screening pap tests and pelvic exam: pt UTD via her GYN MD. -ultrasound screening for AAA: doesn't qualify.  Of the above listed services,  Pt was given prevnar 13 today and will get pneumovax 23 in 1 yr. She also got blood draw today to screen for Hepatitis C.   Patient did not have an additional complaint/problem that was discussed and evaluated today.  Patient was given opportunity to ask any additional questions regarding Medicare and covered benefits.  Patient was informed that Medicare does not provide coverage for routine physical exams.  I answered all questions to the best of my ability today.    Follow up: Return in about 6 months (around 04/11/2016) for annual CPE  (fasting).  Will obtain records from Dr. Evelene CroonKaur (Labs from the last year).  Signed:  Santiago BumpersPhil McGowen, MD           10/13/2015

## 2015-10-13 NOTE — Progress Notes (Signed)
Pre visit review using our clinic review tool, if applicable. No additional management support is needed unless otherwise documented below in the visit note. 

## 2015-10-14 LAB — HEPATITIS C ANTIBODY: HCV AB: NEGATIVE

## 2015-10-20 ENCOUNTER — Ambulatory Visit (INDEPENDENT_AMBULATORY_CARE_PROVIDER_SITE_OTHER): Payer: Medicare Other | Admitting: Family Medicine

## 2015-10-20 ENCOUNTER — Encounter: Payer: Self-pay | Admitting: Family Medicine

## 2015-10-20 VITALS — BP 155/85 | HR 107 | Temp 99.0°F | Resp 16 | Ht 61.0 in | Wt 185.0 lb

## 2015-10-20 DIAGNOSIS — R05 Cough: Secondary | ICD-10-CM | POA: Diagnosis not present

## 2015-10-20 DIAGNOSIS — R058 Other specified cough: Secondary | ICD-10-CM

## 2015-10-20 DIAGNOSIS — J302 Other seasonal allergic rhinitis: Secondary | ICD-10-CM | POA: Diagnosis not present

## 2015-10-20 DIAGNOSIS — K219 Gastro-esophageal reflux disease without esophagitis: Secondary | ICD-10-CM | POA: Diagnosis not present

## 2015-10-20 MED ORDER — MONTELUKAST SODIUM 10 MG PO TABS: 10.0000 mg | ORAL_TABLET | Freq: Every day | ORAL | 6 refills | Status: DC

## 2015-10-20 NOTE — Progress Notes (Signed)
f  OFFICE VISIT  10/20/2015   CC:  Chief Complaint  Patient presents with  . Cough    Post nasal drip 4 years   HPI:    Patient is a 65 y.o. Caucasian female who presents for chronic (3-4 yrs) cough, says throat always feels like it has a lump of mucous in it, clears her throat constantly.   Gets tickle in throat that sometimes causes coughing "fit" and occ vomits with this.  Says she eats a GERD-friendly diet.  Says this has been going on since before she got on bid PPI and the meds have not changed it.  Sneezes a lot.  Blows snot from nose frequently. Currently takes an OTC allergy daily and doesn't think this has helped.  No nasal sprays.  No hemoptysis or SOB.  No CP.  Past Medical History:  Diagnosis Date  . Allergy    SEASONAL  . Anemia   . Anxiety   . Atrophic vaginitis   . Chronic headaches    tension, mirgraines  . Depression   . Diverticulosis   . Duodenal ulcer 1989  . Dysplasia of cervix, low grade (CIN 1)    S/P LEEP  . Endometrial hyperplasia    SIMPLE  . 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  Marland Kitchen Hiatal hernia   . Hyperlipidemia    LDL goal = < 140  . Medial meniscus tear 04/2014   R knee  . Osteoarthritis    KNEES  . Osteoporosis   . Other and unspecified ovarian cysts     Past Surgical History:  Procedure Laterality Date  . ABDOMINAL SURGERY  2003    EXPLORATORY LAPAROTOMY, LSO  . ADENOIDECTOMY  1954  . CARPAL TUNNEL RELEASE Bilateral 2001  . CHOLECYSTECTOMY  1989   stones  . COLONOSCOPY  2011; 03/2013   2011 NORMAL.  03/2013 TCS done for heme+ stool: no polyps.  Repeat 10 yrs (after 03/2023).  Marland Kitchen DEXA  03/23/2014   Osteopenia.  Pt took prolia x 2 injections, then was switched to evista by her GYN.  Plan for repeat DEXA 01/17/16 per GYN.  Marland Kitchen ENDOMETRIAL BIOPSY  09/27/2015   PATH:  BENIGN  . endoscopy upper GI  2000; 05/2014   negative 2000.  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 Russella Dar is PPI bid long term.  Marland Kitchen HYSTEROSCOPY     UTERINE POLYPS  . MOUTH SURGERY    . OOPHORECTOMY  2003   LSO WITH EXPLORATORY LAPAROTOMY  . TUBAL LIGATION  1987    Outpatient Medications Prior to Visit  Medication Sig Dispense Refill  . Calcium Carbonate-Vitamin D 600-400 MG-UNIT per tablet Take 1 tablet by mouth 2 (two) times daily.      . clorazepate (TRANXENE) 7.5 MG tablet Take 7.5 mg by mouth once as needed.      . diclofenac sodium (VOLTAREN) 1 % GEL Apply 2 g topically 4 (four) times daily. To affected joint. 100 g 11  . Estradiol 10 MCG TABS vaginal tablet Place 1 tablet (10 mcg total) vaginally 2 (two) times a week. 8 tablet 11  . Estradiol 10 MCG TABS vaginal tablet Use vaginally twice daily hx 7 days then twice weekly thereafter. 8 tablet 6  . lamoTRIgine (LAMICTAL) 200 MG tablet Take 400 mg by mouth daily.     . Multiple Vitamin (MULTIVITAMIN) tablet Take 1 tablet by mouth daily.      Marland Kitchen  omeprazole (PRILOSEC) 40 MG capsule TAKE 1 CAPSULE BY MOUTH 2 TIMES A DAY 60 capsule 11  . QUEtiapine (SEROQUEL) 50 MG tablet Take 3 tablets by mouth at bedtime.  4  . raloxifene (EVISTA) 60 MG tablet Take 1 tablet (60 mg total) by mouth daily. 30 tablet 7   No facility-administered medications prior to visit.     No Known Allergies  ROS As per HPI  PE: Blood pressure (!) 155/85, pulse (!) 107, temperature 99 F (37.2 C), temperature source Oral, resp. rate 16, height 5\' 1"  (1.549 m), weight 185 lb (83.9 kg), SpO2 97 %. Gen: Alert, well appearing.  Patient is oriented to person, place, time, and situation. AFFECT: pleasant, lucid thought and speech. ENT: Ears: EACs clear, normal epithelium.  TMs with good light reflex and landmarks bilaterally.  Eyes: no injection, icteris, swelling, or exudate.  EOMI, PERRLA. Nose: no drainage or turbinate edema/swelling.  No injection or focal lesion.  Mouth: lips without lesion/swelling.  Oral mucosa pink and moist.  Dentition  intact and without obvious caries or gingival swelling.  Oropharynx without erythema, exudate, or swelling.  Neck - No masses or thyromegaly or limitation in range of motion CV: RRR, no m/r/g.   LUNGS: CTA bilat, nonlabored resps, good aeration in all lung fields.   LABS:  none  IMPRESSION AND PLAN:  Upper airway cough syndrome: She is maxed out on PPI.  Encouraged strict compliance with behavioral and dietary measures to minimize GERD/LPR. Decided to maximize allergic rhinitis treatment today: singulair 10mg  qhs rx'd. Recommended otc generic allegra 180mg  qd and flonase 2 sprays each nostril once daily.  She has appt to f/u with her GI MD next month per her report today.  An After Visit Summary was printed and given to the patient.  FOLLOW UP: Return if symptoms worsen or fail to improve.  Signed:  Santiago BumpersPhil Siarah Deleo, MD           10/20/2015

## 2015-10-20 NOTE — Patient Instructions (Signed)
Elevate the head of your bed (ideally with 6 inch  bed blocks),  Smoking cessation, avoidance of late meals, excessive alcohol, and avoid fatty foods, chocolate, peppermint, colas, red wine, and acidic juices such as orange juice.  NO MINT OR MENTHOL PRODUCTS SO NO COUGH DROPS   USE SUGARLESS CANDY INSTEAD (Jolley ranchers or Stover's or Life Savers) or even ice chips will also do - the key is to swallow to prevent all throat clearing. NO OIL BASED VITAMINS - use powdered substitutes.  Buy otc generic for allegra 180 mg and take once daily (fexofenadine). Buy otc generic for flonase and use 2 sprays in each nostril once daily.

## 2015-11-01 ENCOUNTER — Ambulatory Visit (INDEPENDENT_AMBULATORY_CARE_PROVIDER_SITE_OTHER): Payer: Medicare Other

## 2015-11-01 DIAGNOSIS — Z23 Encounter for immunization: Secondary | ICD-10-CM | POA: Diagnosis not present

## 2015-11-06 NOTE — Progress Notes (Signed)
[name] [address] [place], [place] [address] Phone: [phone] Subjective:    BJ:YNWG knee pain f/u  NFA:[id number]  Suzanne Spencer is a 65 y.o. female coming in with complaint of  Patient is having more of a left knee pain. Patient discusses a dull, throbbing aching sensation. 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 Patient was concerned with deep venous thrombosis and was ruled out with ultrasound. Patient states She is 95% better. Not having any significant pain at all. Working out on a regular basis. Happy with the results  Patient has had x-rays of the knee back in July 2015 showed mild to moderate osteophytic changes.   Past medical history, social, surgical and family history all reviewed in electronic medical record.  Past Medical History:  Diagnosis Date  . Allergy    SEASONAL  . Anemia   . Anxiety   . Atrophic vaginitis   . Chronic headaches    tension, mirgraines  . Depression   . Diverticulosis   . Duodenal ulcer 1989  . Dysplasia of cervix, low grade (CIN 1)    S/P LEEP  . Endometrial hyperplasia    SIMPLE  . 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  Marland Kitchen Hiatal hernia   . Hyperlipidemia    LDL goal = < 140  . Medial meniscus tear 04/2014   R knee  . Osteoarthritis    KNEES  . Osteoporosis   . Other and unspecified ovarian cysts    Past Surgical History:  Procedure Laterality Date  . ABDOMINAL SURGERY  2003    EXPLORATORY LAPAROTOMY, LSO  . ADENOIDECTOMY  1954  . CARPAL TUNNEL RELEASE Bilateral 2001  . CHOLECYSTECTOMY  1989   stones  . COLONOSCOPY  2011; 03/2013   2011 NORMAL.  03/2013 TCS done for heme+ stool: no polyps.  Repeat 10 yrs (after 03/2023).  Marland Kitchen DEXA  03/23/2014   Osteopenia.  Pt took prolia x 2 injections, then was switched to evista by her GYN.  Plan for repeat DEXA 01/17/16 per GYN.  Marland Kitchen ENDOMETRIAL BIOPSY  09/27/2015   PATH:   BENIGN  . endoscopy upper GI  2000; 05/2014   negative 2000.  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 Russella Dar is PPI bid long term.  Marland Kitchen HYSTEROSCOPY     UTERINE POLYPS  . MOUTH SURGERY    . OOPHORECTOMY  2003   LSO WITH EXPLORATORY LAPAROTOMY  . TUBAL LIGATION  1987   Social History  Substance Use Topics  . Smoking status: Never Smoker  . Smokeless tobacco: Never Used  . Alcohol use 0.6 oz/week    1 Glasses of wine per week     Comment: Rarely   Family History  Problem Relation Age of Onset  . Heart attack Father     abnormal EKG; ? AVR  . Diabetes Father   . Heart disease Father   . Hyperlipidemia Mother   . Anxiety disorder Mother   . Depression Mother   . Irregular heart beat Sister   . Heart disease Maternal Grandfather   . Heart failure Maternal Grandfather   . Osteoporosis Maternal Grandmother   . Colon cancer Neg Hx       Review of Systems: No headache, visual changes, nausea, vomiting, diarrhea, constipation, dizziness, abdominal pain, skin rash, fevers, chills, night sweats, weight  loss, swollen lymph nodes, body aches, joint swelling, muscle aches, chest pain, shortness of breath, mood changes.   Objective  There were no vitals taken for this visit.  General: No apparent distress alert and oriented x3 mood and affect normal, dressed appropriately.  HEENT: Pupils equal, extraocular movements intact  Respiratory: Patient's speak in full sentences and does not appear short of breath  Cardiovascular: No lower extremity edema, non tender, no erythema  Skin: Warm dry intact with no signs of infection or rash on extremities or on axial skeleton.  Abdomen: Soft nontender  Neuro: Cranial nerves II through XII are intact, neurovascularly intact in all extremities with 2+ DTRs and 2+ pulses.  Lymph: No lymphadenopathy of posterior or anterior cervical chain or axillae bilaterally.  Gait normal with  good balance and coordination.  MSK:  Non tender with full range of motion and good stability and symmetric strength and tone of shoulders, elbows, wrist, hip, and ankles bilaterally.  Knee: Left Normal to inspection with no erythema or effusion or obvious bony abnormalities. Minimal tenderness over the medial joint line but more tenderness over the lateral hamstring distally. ROM full in flexion and extension and lower leg rotation. Ligaments with solid consistent endpoints including ACL, PCL, LCL, MCL. Negative Mcmurray's, Apley's, and Thessalonian tests. Non painful patellar compression. Patellar glide with mild crepitus. Patellar and quadriceps tendons unremarkable. Hamstring and quadriceps strength is normal.  Contralateral knee unremarkable         Impression and Recommendations:     This case required medical decision making of moderate complexity.

## 2015-11-07 ENCOUNTER — Ambulatory Visit (INDEPENDENT_AMBULATORY_CARE_PROVIDER_SITE_OTHER): Payer: Medicare Other | Admitting: Family Medicine

## 2015-11-07 ENCOUNTER — Encounter: Payer: Self-pay | Admitting: Family Medicine

## 2015-11-07 DIAGNOSIS — M65852 Other synovitis and tenosynovitis, left thigh: Secondary | ICD-10-CM | POA: Diagnosis not present

## 2015-11-07 DIAGNOSIS — M76892 Other specified enthesopathies of left lower limb, excluding foot: Secondary | ICD-10-CM

## 2015-11-07 NOTE — Patient Instructions (Signed)
ood to se eyou  You are doing great  See me when you need me.

## 2015-11-07 NOTE — Assessment & Plan Note (Signed)
Patient is improving tendinitis. Seems to be doing very well at this time. I think that conservative therapy will be time. Patient will come back and see me again on an as-needed basis.

## 2015-11-13 DIAGNOSIS — K1121 Acute sialoadenitis: Secondary | ICD-10-CM

## 2015-11-13 HISTORY — DX: Acute sialoadenitis: K11.21

## 2015-11-14 ENCOUNTER — Encounter: Payer: Self-pay | Admitting: Family Medicine

## 2015-11-14 ENCOUNTER — Ambulatory Visit (INDEPENDENT_AMBULATORY_CARE_PROVIDER_SITE_OTHER): Payer: Medicare Other | Admitting: Family Medicine

## 2015-11-14 VITALS — BP 148/80 | HR 110 | Temp 97.6°F | Resp 18 | Wt 186.8 lb

## 2015-11-14 DIAGNOSIS — R220 Localized swelling, mass and lump, head: Secondary | ICD-10-CM | POA: Diagnosis not present

## 2015-11-14 DIAGNOSIS — K118 Other diseases of salivary glands: Secondary | ICD-10-CM

## 2015-11-14 DIAGNOSIS — K119 Disease of salivary gland, unspecified: Secondary | ICD-10-CM

## 2015-11-14 LAB — CBC WITH DIFFERENTIAL/PLATELET
BASOS PCT: 0.8 % (ref 0.0–3.0)
Basophils Absolute: 0 10*3/uL (ref 0.0–0.1)
EOS PCT: 3 % (ref 0.0–5.0)
Eosinophils Absolute: 0.2 10*3/uL (ref 0.0–0.7)
HCT: 35.9 % — ABNORMAL LOW (ref 36.0–46.0)
HEMOGLOBIN: 11.9 g/dL — AB (ref 12.0–15.0)
LYMPHS ABS: 2.1 10*3/uL (ref 0.7–4.0)
LYMPHS PCT: 34.5 % (ref 12.0–46.0)
MCHC: 33.1 g/dL (ref 30.0–36.0)
MCV: 85.8 fl (ref 78.0–100.0)
MONO ABS: 0.3 10*3/uL (ref 0.1–1.0)
MONOS PCT: 5.2 % (ref 3.0–12.0)
NEUTROS PCT: 56.5 % (ref 43.0–77.0)
Neutro Abs: 3.5 10*3/uL (ref 1.4–7.7)
Platelets: 180 10*3/uL (ref 150.0–400.0)
RBC: 4.18 Mil/uL (ref 3.87–5.11)
RDW: 13.5 % (ref 11.5–15.5)
WBC: 6.2 10*3/uL (ref 4.0–10.5)

## 2015-11-14 LAB — BASIC METABOLIC PANEL
BUN: 20 mg/dL (ref 6–23)
CALCIUM: 9 mg/dL (ref 8.4–10.5)
CO2: 28 meq/L (ref 19–32)
CREATININE: 0.9 mg/dL (ref 0.40–1.20)
Chloride: 108 mEq/L (ref 96–112)
GFR: 66.58 mL/min (ref >60.00)
GLUCOSE: 86 mg/dL (ref 70–99)
Potassium: 4 mEq/L (ref 3.5–5.1)
SODIUM: 144 meq/L (ref 135–145)

## 2015-11-14 LAB — SEDIMENTATION RATE: Sed Rate: 8 mm/hr (ref 0–30)

## 2015-11-14 NOTE — Progress Notes (Signed)
Pre visit review using our clinic review tool, if applicable. No additional management support is needed unless otherwise documented below in the visit note. 

## 2015-11-14 NOTE — Progress Notes (Addendum)
OFFICE VISIT  11/14/2015   CC:  Chief Complaint  Patient presents with  . Follow-up    Right side of face (jaw area) swollen x 1 week   HPI:    Patient is a 65 y.o. Caucasian female who presents for facial swelling, right side just anterior to R ear and extending down to mandibular region on R. Onset about a week ago.  Gradually enlarging and is mildly tender and began to hurt to chew this morning.  She did not seem to think her salivation was making her R jaw/facial area hurt. No fevers/chills/malaise.  No ear complaints.  No teeth complaints.    She has seen no redness of the skin of her face. No swelling or discomfort of left side of face.   Past Medical History:  Diagnosis Date  . Allergy    SEASONAL  . Anemia   . Anxiety   . Atrophic vaginitis   . Chronic headaches    tension, mirgraines  . Depression   . Diverticulosis   . Duodenal ulcer 1989  . Dysplasia of cervix, low grade (CIN 1)    S/P LEEP  . Endometrial hyperplasia    SIMPLE  . 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  Marland Kitchen Hiatal hernia   . Hyperlipidemia    LDL goal = < 140  . Medial meniscus tear 04/2014   R knee  . Osteoarthritis    KNEES  . Osteoporosis   . Other and unspecified ovarian cysts     Past Surgical History:  Procedure Laterality Date  . ABDOMINAL SURGERY  2003    EXPLORATORY LAPAROTOMY, LSO  . ADENOIDECTOMY  1954  . CARPAL TUNNEL RELEASE Bilateral 2001  . CHOLECYSTECTOMY  1989   stones  . COLONOSCOPY  2011; 03/2013   2011 NORMAL.  03/2013 TCS done for heme+ stool: no polyps.  Repeat 10 yrs (after 03/2023).  Marland Kitchen DEXA  03/23/2014   Osteopenia.  Pt took prolia x 2 injections, then was switched to evista by her GYN.  Plan for repeat DEXA 01/17/16 per GYN.  Marland Kitchen ENDOMETRIAL BIOPSY  09/27/2015   PATH:  BENIGN  . endoscopy upper GI  2000; 05/2014   negative 2000.  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 Fuller Plan is PPI  bid long term.  Marland Kitchen HYSTEROSCOPY     UTERINE POLYPS  . MOUTH SURGERY    . OOPHORECTOMY  2003   LSO WITH EXPLORATORY LAPAROTOMY  . TUBAL LIGATION  1987    Outpatient Medications Prior to Visit  Medication Sig Dispense Refill  . Calcium Carbonate-Vitamin D 600-400 MG-UNIT per tablet Take 1 tablet by mouth 2 (two) times daily.      . clorazepate (TRANXENE) 7.5 MG tablet Take 7.5 mg by mouth once as needed.      . diclofenac sodium (VOLTAREN) 1 % GEL Apply 2 g topically 4 (four) times daily. To affected joint. 100 g 11  . Estradiol 10 MCG TABS vaginal tablet Place 1 tablet (10 mcg total) vaginally 2 (two) times a week. 8 tablet 11  . Estradiol 10 MCG TABS vaginal tablet Use vaginally twice daily hx 7 days then twice weekly thereafter. 8 tablet 6  . lamoTRIgine (LAMICTAL) 200 MG tablet Take 400 mg by mouth daily.     . montelukast (SINGULAIR) 10 MG tablet Take 1 tablet (10 mg total) by mouth at bedtime. 30 tablet 6  . Multiple Vitamin (  MULTIVITAMIN) tablet Take 1 tablet by mouth daily.      Marland Kitchen omeprazole (PRILOSEC) 40 MG capsule TAKE 1 CAPSULE BY MOUTH 2 TIMES A DAY 60 capsule 11  . QUEtiapine (SEROQUEL) 50 MG tablet Take 3 tablets by mouth at bedtime.  4  . raloxifene (EVISTA) 60 MG tablet Take 1 tablet (60 mg total) by mouth daily. 30 tablet 7   No facility-administered medications prior to visit.     No Known Allergies  ROS As per HPI  PE: Blood pressure (!) 148/80, pulse (!) 110, temperature 97.6 F (36.4 C), temperature source Oral, resp. rate 18, weight 186 lb 12.8 oz (84.7 kg), SpO2 97 %. Gen: Alert, well appearing.  Patient is oriented to person, place, time, and situation. AFFECT: pleasant, lucid thought and speech. Right ear: normal external anatomy, normal EAC, normal TM.    Entire R parotid gland region swelling, firm but not indurated or immobile.  No fluctuance.  No focal area of tenderness. Oral exam: no focal lesion, no swelling, no teeth sensitivity.  Buccal mucosa  shows no focal swelling or erythema. No mass or tenderness under the R mandible.  Left side of face/L parotid gland region w/out any swelling or tenderness. Neck: no LAD.  LABS:    Chemistry      Component Value Date/Time   NA 143 03/10/2015 0849   K 3.9 03/10/2015 0849   CL 106 03/10/2015 0849   CO2 31 03/10/2015 0849   BUN 17 03/10/2015 0849   CREATININE 0.79 03/10/2015 0849   CREATININE 0.84 03/10/2013 1128      Component Value Date/Time   CALCIUM 9.1 03/10/2015 0849   ALKPHOS 106 12/12/2012 1103   AST 21 12/12/2012 1103   ALT 28 12/12/2012 1103   BILITOT 0.2 (L) 12/12/2012 1103       IMPRESSION AND PLAN:  Unilateral (right) parotid gland swelling, minimal pain/tenderness on exam.  Not highly suggestive of viral or bacterial parotitis; need to r/o mass. Check BMET, CBC, ESR today. Ordered CT soft tissue neck with contrast to be done ASAP. No meds or referrals at this time.  An After Visit Summary was printed and given to the patient.  FOLLOW UP: Return for f/u to be determined based on results of work up.  Signed:  Crissie Sickles, MD           11/14/2015

## 2015-11-15 ENCOUNTER — Telehealth: Payer: Self-pay | Admitting: Family Medicine

## 2015-11-15 DIAGNOSIS — K118 Other diseases of salivary glands: Secondary | ICD-10-CM

## 2015-11-15 DIAGNOSIS — K1121 Acute sialoadenitis: Secondary | ICD-10-CM

## 2015-11-15 DIAGNOSIS — K1120 Sialoadenitis, unspecified: Secondary | ICD-10-CM

## 2015-11-15 MED ORDER — AMOXICILLIN-POT CLAVULANATE 875-125 MG PO TABS: 1.0000 | ORAL_TABLET | Freq: Two times a day (BID) | ORAL | 0 refills | Status: DC

## 2015-11-15 NOTE — Telephone Encounter (Signed)
Reviewed results of pt's CT soft tissue neck with her: Sialadenitis (right parotid gland) with suggestion of right parotid gland duct obstruction. Pt states she has no fevers/chills/malaise, no nausea or feelings of acute illness.  Minimal pain in R parotid, got worse and more swollen with eating last night but then went back down again. I decided to treat with augmentin 875mg  bid x 10d and refer to ENT for further evaluation.  She was in agreement with this plan.

## 2015-11-16 ENCOUNTER — Ambulatory Visit: Payer: Medicare Other | Admitting: Gastroenterology

## 2015-11-17 ENCOUNTER — Telehealth: Payer: Self-pay

## 2015-11-17 NOTE — Telephone Encounter (Signed)
Patient called stating that the Augmentin is causing her to have diarrhea.  Started today and has gone x 5 times.  Patient instructed to go down to 1(one) tab a day for the next three days and she can use Immodium as directed per Dr. Milinda CaveMcGowen. Patient verbalized understanding of instructions.

## 2015-11-17 NOTE — Telephone Encounter (Signed)
I agree

## 2015-11-28 ENCOUNTER — Encounter: Payer: Self-pay | Admitting: Family Medicine

## 2016-01-17 ENCOUNTER — Ambulatory Visit (INDEPENDENT_AMBULATORY_CARE_PROVIDER_SITE_OTHER): Payer: Medicare Other

## 2016-01-17 ENCOUNTER — Other Ambulatory Visit: Payer: Self-pay | Admitting: Gynecology

## 2016-01-17 DIAGNOSIS — M818 Other osteoporosis without current pathological fracture: Secondary | ICD-10-CM | POA: Diagnosis not present

## 2016-01-17 DIAGNOSIS — M810 Age-related osteoporosis without current pathological fracture: Secondary | ICD-10-CM

## 2016-01-17 DIAGNOSIS — M8580 Other specified disorders of bone density and structure, unspecified site: Secondary | ICD-10-CM

## 2016-01-20 ENCOUNTER — Other Ambulatory Visit: Payer: Self-pay | Admitting: *Deleted

## 2016-01-20 DIAGNOSIS — M8580 Other specified disorders of bone density and structure, unspecified site: Secondary | ICD-10-CM

## 2016-01-23 ENCOUNTER — Other Ambulatory Visit: Payer: Medicare Other

## 2016-01-23 DIAGNOSIS — M8580 Other specified disorders of bone density and structure, unspecified site: Secondary | ICD-10-CM

## 2016-01-24 LAB — VITAMIN D 25 HYDROXY (VIT D DEFICIENCY, FRACTURES): VIT D 25 HYDROXY: 41 ng/mL (ref 30–100)

## 2016-01-25 ENCOUNTER — Encounter: Payer: Medicare Other | Admitting: Gynecology

## 2016-01-26 ENCOUNTER — Encounter: Payer: Medicare Other | Admitting: Gynecology

## 2016-01-30 ENCOUNTER — Ambulatory Visit (INDEPENDENT_AMBULATORY_CARE_PROVIDER_SITE_OTHER): Payer: Medicare Other | Admitting: Gynecology

## 2016-01-30 ENCOUNTER — Encounter: Payer: Self-pay | Admitting: Gynecology

## 2016-01-30 VITALS — BP 128/76 | Ht 61.0 in | Wt 185.0 lb

## 2016-01-30 DIAGNOSIS — M8580 Other specified disorders of bone density and structure, unspecified site: Secondary | ICD-10-CM | POA: Diagnosis not present

## 2016-01-30 DIAGNOSIS — Z01411 Encounter for gynecological examination (general) (routine) with abnormal findings: Secondary | ICD-10-CM | POA: Diagnosis not present

## 2016-01-30 NOTE — Progress Notes (Signed)
Suzanne CourseKathleen Aloi Jan 13, 1951 [id number]   History:    65 y.o.  for annual gyn exam with no complaints today review of her record indicated in August of this year due to the fact that she's para some brownish bloody discharge and had been on Vagifem 10 g twice a week underwent an endometrial biopsy which was benign. Review of her record also indicated that she was seen the office in February 2016 to discuss her bone density study and had been started on Evista 60 mg daily. Her bone density study history is as follows:  In 2014 she had been started on Prolia every 6 months in February 2014. Her bone density study history is as follows:  2008 AP spine T score of -2.2  2012 AP spine T score of -2.5 with 15.9% decreased bone mineralization when compared with study of 2010.  2014 AP spine T score of -2.5 unchanged from 2012. The remainder of the region of interest were all in the normal range.  The patient stated that many years ago she had been on Fosamax for a few years and has been off of it for over 10 years. She is currently taking her calcium and vitamin D twice a day.She had informed me that her oral surgeon last year which she just completed in October having oral surgery requiring graft and implants to be placed. This is the reason we had taken her off the medication and started her on the Evista.  On the last office visit at time of her annual exam on 12/29/2013 we have discussed switching her to a selective estrogen receptor modulator such as Evista 60 mg to take 1 by mouth daily but we will going to see the results of her bone density study that was done recently here in the office on 03/23/2014 demonstrated the following:  AP spine T score -0.8 (statistically significant improvement in BMD of 22%) Left femoral neck T score -1.4 (nonstatistically increase in BMD) Right femoral neck T score -0.1 (statistically significant improvement in BMD 4.7%) Distal one third left forearm T  score -1.2  Patient had a statistically improvement of 22% of her bone mass at the AP spine after one year of being on Prolia. Patient had a normal colonoscopy in 2015. Her PCP has been doing her blood work and her vaccines are up-to-date. Patient many years ago had mild dysplasia but subsequent follow-up patches of been normal.   Past medical history,surgical history, family history and social history were all reviewed and documented in the EPIC chart.  Gynecologic History No LMP recorded. Patient is postmenopausal. Contraception: post menopausal status Last Pap:. January 2017 Results were: normal Last mammogram: 2017. Results were: normal  Obstetric History OB History  Gravida Para Term Preterm AB Living  1 1 1     1   SAB TAB Ectopic Multiple Live Births               # Outcome Date GA Lbr Len/2nd Weight Sex Delivery Anes PTL Lv  1 Term                ROS: A ROS was performed and pertinent positives and negatives are included in the history.  GENERAL: No fevers or chills. HEENT: No change in vision, no earache, sore throat or sinus congestion. NECK: No pain or stiffness. CARDIOVASCULAR: No chest pain or pressure. No palpitations. PULMONARY: No shortness of breath, cough or wheeze. GASTROINTESTINAL: No abdominal pain, nausea, vomiting or diarrhea, melena or bright  red blood per rectum. GENITOURINARY: No urinary frequency, urgency, hesitancy or dysuria. MUSCULOSKELETAL: No joint or muscle pain, no back pain, no recent trauma. DERMATOLOGIC: No rash, no itching, no lesions. ENDOCRINE: No polyuria, polydipsia, no heat or cold intolerance. No recent change in weight. HEMATOLOGICAL: No anemia or easy bruising or bleeding. NEUROLOGIC: No headache, seizures, numbness, tingling or weakness. PSYCHIATRIC: No depression, no loss of interest in normal activity or change in sleep pattern.     Exam: chaperone present  BP 128/76   Ht 5\' 1"  (1.549 m)   Wt 185 lb (83.9 kg)   BMI 34.96 kg/m    Body mass index is 34.96 kg/m.  General appearance : Well developed well nourished female. No acute distress HEENT: Eyes: no retinal hemorrhage or exudates,  Neck supple, trachea midline, no carotid bruits, no thyroidmegaly Lungs: Clear to auscultation, no rhonchi or wheezes, or rib retractions  Heart: Regular rate and rhythm, no murmurs or gallops Breast:Examined in sitting and supine position were symmetrical in appearance, no palpable masses or tenderness,  no skin retraction, no nipple inversion, no nipple discharge, no skin discoloration, no axillary or supraclavicular lymphadenopathy Abdomen: no palpable masses or tenderness, no rebound or guarding Extremities: no edema or skin discoloration or tenderness  Pelvic:  Bartholin, Urethra, Skene Glands: Within normal limits             Vagina: No gross lesions or discharge  Cervix: No gross lesions or discharge  Uterus  anteverted, normal size, shape and consistency, non-tender and mobile  Adnexa  Without masses or tenderness  Anus and perineum  normal   Rectovaginal  normal sphincter tone without palpated masses or tenderness             Hemoccult PCP provides     Assessment/Plan:  65 y.o. female for annual exam with history of osteopenia on Evista 60 mg daily. Patient to return back in 2 years for follow-up bone density study. Patient cannot take oral bisphosphonate's as a result of her reflux. If her bone density study be cancer demonstrated evidence of osteoporosis she will be a candidate for Reclast once a year or initiating once again Prolia 60 mg every 6 months. Pap smear not performed today according to the new guidelines. We discussed importance of calcium vitamin D and weightbearing exercise for osteoporosis prevention. She is going to continue to use the Vagifem 10 g vaginally to help with her vaginal atrophy. She will apply one tablet weekly but every other week apply one tablet twice a week. The risks benefits and pros and  cons once again discussed. Her PCP is been doing her blood work and her vaccines.   Ok EdwardsFERNANDEZ,JUAN H MD, 5:29 PM 01/30/2016

## 2016-01-31 ENCOUNTER — Encounter: Payer: Self-pay | Admitting: Family Medicine

## 2016-02-13 DIAGNOSIS — R7303 Prediabetes: Secondary | ICD-10-CM

## 2016-02-13 HISTORY — DX: Prediabetes: R73.03

## 2016-03-20 ENCOUNTER — Encounter: Payer: Medicare Other | Admitting: Family Medicine

## 2016-03-26 ENCOUNTER — Other Ambulatory Visit: Payer: Self-pay | Admitting: Gynecology

## 2016-03-29 ENCOUNTER — Telehealth: Payer: Self-pay

## 2016-03-29 NOTE — Telephone Encounter (Signed)
LM requesting return call regarding AWV. Would like to complete on 04/17/16 @ 0800 prior to CPE with PCP.

## 2016-03-30 NOTE — Telephone Encounter (Signed)
Patient returning your call Re annual wellness visit.  Thank you,  -LL

## 2016-04-03 NOTE — Telephone Encounter (Signed)
Called patient, she had IPPE in Aug. 2017, not due for AWV until after Aug. 2018.  She states she has been seeing psychiatry for several years and has blood work done through them. Her next appt is 1 week after next PCP appt. She would like to just get blood work done w/ psych and have it sent to PCP. Advised pt to find out what labs psych will be drawing and discuss further w/ PCP at time of visit.

## 2016-04-17 ENCOUNTER — Ambulatory Visit (INDEPENDENT_AMBULATORY_CARE_PROVIDER_SITE_OTHER): Payer: Medicare Other | Admitting: Family Medicine

## 2016-04-17 ENCOUNTER — Telehealth: Payer: Self-pay | Admitting: Family Medicine

## 2016-04-17 ENCOUNTER — Encounter: Payer: Self-pay | Admitting: Family Medicine

## 2016-04-17 VITALS — BP 107/62 | HR 102 | Temp 98.8°F | Resp 16 | Ht 61.5 in | Wt 185.8 lb

## 2016-04-17 DIAGNOSIS — R682 Dry mouth, unspecified: Secondary | ICD-10-CM

## 2016-04-17 DIAGNOSIS — Z0000 Encounter for general adult medical examination without abnormal findings: Secondary | ICD-10-CM

## 2016-04-17 DIAGNOSIS — Z79899 Other long term (current) drug therapy: Secondary | ICD-10-CM | POA: Diagnosis not present

## 2016-04-17 LAB — LIPID PANEL
CHOL/HDL RATIO: 3
Cholesterol: 211 mg/dL — ABNORMAL HIGH (ref 0–200)
HDL: 74.4 mg/dL (ref >39.00)
LDL CALC: 113 mg/dL — AB (ref 0–99)
NonHDL: 136.23
Triglycerides: 116 mg/dL (ref 0.0–149.0)
VLDL: 23.2 mg/dL (ref 0.0–40.0)

## 2016-04-17 LAB — COMPREHENSIVE METABOLIC PANEL
ALT: 19 U/L (ref 0–35)
AST: 23 U/L (ref 0–37)
Albumin: 4.2 g/dL (ref 3.5–5.2)
Alkaline Phosphatase: 91 U/L (ref 39–117)
BUN: 14 mg/dL (ref 6–23)
CALCIUM: 9.5 mg/dL (ref 8.4–10.5)
CHLORIDE: 107 meq/L (ref 96–112)
CO2: 30 meq/L (ref 19–32)
CREATININE: 1.01 mg/dL (ref 0.40–1.20)
GFR: 58.21 mL/min — ABNORMAL LOW (ref >60.00)
Glucose, Bld: 99 mg/dL (ref 70–99)
Potassium: 4.4 mEq/L (ref 3.5–5.1)
SODIUM: 144 meq/L (ref 135–145)
Total Bilirubin: 0.3 mg/dL (ref 0.2–1.2)
Total Protein: 6.8 g/dL (ref 6.0–8.3)

## 2016-04-17 LAB — CBC WITH DIFFERENTIAL/PLATELET
BASOS PCT: 0.9 % (ref 0.0–3.0)
Basophils Absolute: 0.1 10*3/uL (ref 0.0–0.1)
Eosinophils Absolute: 0.2 10*3/uL (ref 0.0–0.7)
Eosinophils Relative: 2.3 % (ref 0.0–5.0)
HEMATOCRIT: 38.4 % (ref 36.0–46.0)
Hemoglobin: 12.6 g/dL (ref 12.0–15.0)
LYMPHS PCT: 35.2 % (ref 12.0–46.0)
Lymphs Abs: 2.3 10*3/uL (ref 0.7–4.0)
MCHC: 32.7 g/dL (ref 30.0–36.0)
MCV: 87 fl (ref 78.0–100.0)
Monocytes Absolute: 0.4 10*3/uL (ref 0.1–1.0)
Monocytes Relative: 6.8 % (ref 3.0–12.0)
NEUTROS ABS: 3.6 10*3/uL (ref 1.4–7.7)
Neutrophils Relative %: 54.8 % (ref 43.0–77.0)
PLATELETS: 185 10*3/uL (ref 150.0–400.0)
RBC: 4.41 Mil/uL (ref 3.87–5.11)
RDW: 14.7 % (ref 11.5–15.5)
WBC: 6.5 10*3/uL (ref 4.0–10.5)

## 2016-04-17 LAB — TSH: TSH: 2.32 u[IU]/mL (ref 0.35–4.50)

## 2016-04-17 LAB — HEMOGLOBIN A1C: Hgb A1c MFr Bld: 6 % (ref 4.6–6.5)

## 2016-04-17 MED ORDER — OMEPRAZOLE 40 MG PO CPDR: DELAYED_RELEASE_CAPSULE | ORAL | 3 refills | Status: DC

## 2016-04-17 MED ORDER — PILOCARPINE HCL 5 MG PO TABS: 5.0000 mg | ORAL_TABLET | Freq: Two times a day (BID) | ORAL | 11 refills | Status: DC

## 2016-04-17 NOTE — Progress Notes (Signed)
Office Note 04/17/2016  CC:  Chief Complaint  Patient presents with  . Annual Exam    Pt is fasting.     HPI:  Suzanne Spencer is a 66 y.o. female who is here for annual health maintenance exam. GYN MD: Dr. Lily PeerFernandez: bone density done 01/2016.  Most recent pelvic was 01/2016.  Pap UTD. Most recent mammogram 08/2015.  Dr. Evelene CroonKaur managing her psych meds.  Dry mouth very bothersome.  Asks for rx of either evoxac or salagen.  Feeling well lately[facility], plus home stationary bike 6 d/week.  Past Medical History:  Diagnosis Date  . Acute parotitis 11/2015   sialolithiasis suspected by ENT  . Allergy    SEASONAL  . Anemia   . Anxiety   . Atrophic vaginitis   . Chronic headaches    tension, mirgraines  . Depression   . Diverticulosis   . Duodenal ulcer 1989  . Dysplasia of cervix, low grade (CIN 1)    S/P LEEP  . Endometrial hyperplasia    SIMPLE  . GERD (gastroesophageal reflux disease)   . 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  Much improved at Dr. Michaelle CopasSmith's f/u 10/2015.  Marland Kitchen. Hiatal hernia   . Hyperlipidemia    LDL goal = < 140.  No meds as of 04/2016.  . Medial meniscus tear 04/2014   R knee  . Osteoarthritis    KNEES  . Osteoporosis    GYN managing; as of 01/2016 pt is on Evista.  She is intol of oral bisphosph due to GERD.  . Other and unspecified ovarian cysts     Past Surgical History:  Procedure Laterality Date  . ABDOMINAL SURGERY  2003    EXPLORATORY LAPAROTOMY, LSO  . ADENOIDECTOMY  1954  . CARPAL TUNNEL RELEASE Bilateral 2001  . CHOLECYSTECTOMY  1989   stones  . COLONOSCOPY  2011; 03/2013   2011 NORMAL.  03/2013 TCS done for heme+ stool: no polyps.  Repeat 10 yrs (after 03/2023).  Marland Kitchen. DEXA  03/23/2014   Osteopenia.  Pt took prolia x 2 injections, then was switched to evista by her GYN.  Plan for repeat DEXA 01/17/16 per GYN.  Marland Kitchen. ENDOMETRIAL BIOPSY  09/27/2015   PATH:  BENIGN  . endoscopy upper GI  2000; 05/2014   negative  2000.  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 Russella DarStark is PPI bid long term.  Marland Kitchen. HYSTEROSCOPY     UTERINE POLYPS  . MOUTH SURGERY    . OOPHORECTOMY  2003   LSO WITH EXPLORATORY LAPAROTOMY  . TUBAL LIGATION  1987    Family History  Problem Relation Age of Onset  . Heart attack Father     abnormal EKG; ? AVR  . Diabetes Father   . Heart disease Father   . Hyperlipidemia Mother   . Anxiety disorder Mother   . Depression Mother   . Irregular heart beat Sister   . Heart disease Maternal Grandfather   . Heart failure Maternal Grandfather   . Osteoporosis Maternal Grandmother   . Colon cancer Neg Hx     Social History   Social History  . Marital status: Married    Spouse name: N/A  . Number of children: 1  . Years of education: N/A   Occupational History  . retired Not Employed   Social History Main Topics  . Smoking status: Never Smoker  .  Smokeless tobacco: Never Used  . Alcohol use 0.6 oz/week    1 Glasses of wine per week     Comment: Rarely  . Drug use: No  . Sexual activity: Yes    Birth control/ protection: Surgical   Other Topics Concern  . Not on file   Social History Narrative   Married, one son.   Orig from [place] area, lives in [place].   Occupation: retired from [employer]).   No tob, rare, alcohol, no drugs.   Exercise: stationary bike 6 days a week.  [facility].    Outpatient Medications Prior to Visit  Medication Sig Dispense Refill  . Calcium Carbonate-Vitamin D 600-400 MG-UNIT per tablet Take 1 tablet by mouth 2 (two) times daily.      . clorazepate (TRANXENE) 7.5 MG tablet Take 7.5 mg by mouth once as needed.      . Multiple Vitamin (MULTIVITAMIN) tablet Take 1 tablet by mouth daily.      . raloxifene (EVISTA) 60 MG tablet TAKE 1 TABLET (60 MG TOTAL) BY MOUTH DAILY. 30 tablet 7  . omeprazole (PRILOSEC) 40 MG capsule TAKE 1 CAPSULE  BY MOUTH 2 TIMES A DAY 60 capsule 11  . Estradiol 10 MCG TABS vaginal tablet Place 1 tablet (10 mcg total) vaginally 2 (two) times a week. (Patient not taking: Reported on 04/17/2016) 8 tablet 11  . Estradiol 10 MCG TABS vaginal tablet Use vaginally twice daily hx 7 days then twice weekly thereafter. (Patient not taking: Reported on 04/17/2016) 8 tablet 6  . lamoTRIgine (LAMICTAL) 200 MG tablet Take 400 mg by mouth daily.     . magnesium 30 MG tablet Take 30 mg by mouth 2 (two) times daily.    . montelukast (SINGULAIR) 10 MG tablet Take 1 tablet (10 mg total) by mouth at bedtime. (Patient not taking: Reported on 01/30/2016) 30 tablet 6  . QUEtiapine (SEROQUEL) 50 MG tablet Take 3 tablets by mouth at bedtime.  4   No facility-administered medications prior to visit.     No Known Allergies  ROS Review of Systems  Constitutional: Negative for appetite change, chills, fatigue and fever.  HENT: Negative for congestion, dental problem, ear pain and sore throat.        Very signif dry mouth secondary to her seroquel  Eyes: Negative for discharge, redness and visual disturbance.  Respiratory: Negative for cough, chest tightness, shortness of breath and wheezing.   Cardiovascular: Negative for chest pain, palpitations and leg swelling.  Gastrointestinal: Negative for abdominal pain, blood in stool, diarrhea, nausea and vomiting.  Genitourinary: Negative for difficulty urinating, dysuria, flank pain, frequency, hematuria and urgency.  Musculoskeletal: Negative for arthralgias, back pain, joint swelling, myalgias and neck stiffness.  Skin: Negative for pallor and rash.  Neurological: Negative for dizziness, speech difficulty, weakness and headaches.  Hematological: Negative for adenopathy. Does not bruise/bleed easily.  Psychiatric/Behavioral: Negative for confusion and sleep disturbance. The patient is not nervous/anxious.     PE; Blood pressure 107/62, pulse (!) 102, temperature 98.8 F (37.1 C),  temperature source Oral, resp. rate 16, height 5' 1.5" (1.562 m), weight 185 lb 12 oz (84.3 kg), SpO2 96 %. Gen: Alert, well appearing.  Patient is oriented to person, place, time, and situation. AFFECT: pleasant, lucid thought and speech. ENT: Ears: EACs clear, normal epithelium.  TMs with good light reflex and landmarks bilaterally.  Eyes: no injection, icteris, swelling, or exudate.  EOMI, PERRLA. Nose:  no drainage or turbinate edema/swelling.  No injection or focal lesion.  Mouth: lips without lesion/swelling.  Oral mucosa pink and moist.  Dentition intact and without obvious caries or gingival swelling.  Oropharynx without erythema, exudate, or swelling.  Neck: supple/nontender.  No LAD, mass, or TM.  Carotid pulses 2+ bilaterally, without bruits. CV: RRR, no m/r/g.   LUNGS: CTA bilat, nonlabored resps, good aeration in all lung fields. ABD: soft, NT, ND, BS normal.  No hepatospenomegaly or mass.  No bruits. EXT: no clubbing, cyanosis, or edema.  Musculoskeletal: no joint swelling, erythema, warmth, or tenderness.  ROM of all joints intact. Skin - no sores or suspicious lesions or rashes or color changes   Pertinent labs:  Lab Results  Component Value Date   TSH 0.386 12/12/2012   Lab Results  Component Value Date   WBC 6.2 11/14/2015   HGB 11.9 (L) 11/14/2015   HCT 35.9 (L) 11/14/2015   MCV 85.8 11/14/2015   PLT 180.0 11/14/2015   Lab Results  Component Value Date   CREATININE 0.90 11/14/2015   BUN 20 11/14/2015   NA 144 11/14/2015   K 4.0 11/14/2015   CL 108 11/14/2015   CO2 28 11/14/2015   Lab Results  Component Value Date   ALT 28 12/12/2012   AST 21 12/12/2012   ALKPHOS 106 12/12/2012   BILITOT 0.2 (L) 12/12/2012   Lab Results  Component Value Date   CHOL 191 12/22/2012   Lab Results  Component Value Date   HDL 68 12/22/2012   Lab Results  Component Value Date   LDLCALC 100 (H) 12/22/2012   Lab Results  Component Value Date   TRIG 113 12/22/2012    Lab Results  Component Value Date   CHOLHDL 2.8 12/22/2012   Lab Results  Component Value Date   HGBA1C 5.7 (H) 11/13/2011   ASSESSMENT AND PLAN:   Health maintenance exam: Reviewed age and gender appropriate health maintenance issues (prudent diet, regular exercise, health risks of tobacco and excessive alcohol, use of seatbelts, fire alarms in home, use of sunscreen).  Also reviewed age and gender appropriate health screening as well as vaccine recommendations. Due for pneumovax 23 after 10/13/15. Fasting HP labs today, plus Hba1c for high risk med use (seroquel, increased risk of metabolic derangements). Colon cancer screening: next colonoscopy after 03/2023. GYN: cervical cancer screening, breast cancer screening, and osteoporosis management/follow up is done by her GYN, Dr. Lily Peer. She has severe GERD, has seen GI MD and was put on bid omeprazole 40mg  and this is very helpful. She asks me to RF this med today so I did so.  An After Visit Summary was printed and given to the patient.  FOLLOW UP:  Return in about 1 year (around 04/17/2017) for annual CPE (fasting).  Signed:  Santiago Bumpers, MD           04/17/2016

## 2016-04-17 NOTE — Telephone Encounter (Signed)
Please send a copy of lab results to Dr. Milagros Evenerupinder Kaur per patient request.

## 2016-04-17 NOTE — Progress Notes (Signed)
Pre visit review using our clinic review tool, if applicable. No additional management support is needed unless otherwise documented below in the visit note. 

## 2016-04-17 NOTE — Telephone Encounter (Signed)
FYI

## 2016-04-21 ENCOUNTER — Encounter: Payer: Self-pay | Admitting: Family Medicine

## 2016-06-25 ENCOUNTER — Other Ambulatory Visit: Payer: Self-pay

## 2016-06-25 MED ORDER — OMEPRAZOLE 40 MG PO CPDR: DELAYED_RELEASE_CAPSULE | ORAL | 0 refills | Status: DC

## 2016-06-27 ENCOUNTER — Other Ambulatory Visit: Payer: Self-pay

## 2016-06-27 ENCOUNTER — Encounter: Payer: Self-pay | Admitting: Gynecology

## 2016-06-27 MED ORDER — OMEPRAZOLE 40 MG PO CPDR: DELAYED_RELEASE_CAPSULE | ORAL | 0 refills | Status: DC

## 2016-09-04 ENCOUNTER — Other Ambulatory Visit: Payer: Self-pay

## 2016-09-04 MED ORDER — RALOXIFENE HCL 60 MG PO TABS: 60.0000 mg | ORAL_TABLET | Freq: Every day | ORAL | 5 refills | Status: DC

## 2016-09-06 ENCOUNTER — Telehealth: Payer: Self-pay | Admitting: Family Medicine

## 2016-09-06 NOTE — Telephone Encounter (Signed)
Patient aware, nurse injection scheduled  10/16/16.

## 2016-09-06 NOTE — Telephone Encounter (Signed)
Patient states that was to come into office for second part of pneumovax.  Okay to schedule nurse injection and please clarify which vaccine?   Thanks!  * okay to leave detailed message on patient's cell.

## 2016-09-06 NOTE — Telephone Encounter (Signed)
She can schedule nurse visit for this, but it has to be given AFTER 10/12/16 (it will need to be pneumovax 23)--thx

## 2016-10-08 LAB — HM MAMMOGRAPHY

## 2016-10-10 ENCOUNTER — Encounter: Payer: Self-pay | Admitting: Family Medicine

## 2016-10-12 NOTE — Progress Notes (Signed)
Subjective:   Suzanne Spencer is a 66 y.o. female who presents for Medicare Annual (Subsequent) preventive examination.  Review of Systems:  No ROS.  Medicare Wellness Visit. Additional risk factors are reflected in the social history.  Cardiac Risk Factors include: advanced age (>28men, >34 women);dyslipidemia;obesity (BMI >30kg/m2);family history of premature cardiovascular disease   Sleep patterns: Sleeps 7-8 hours, feels rested.   Home Safety/Smoke Alarms: Feels safe in home. Smoke alarms in place.  Living environment; residence and Solicitor: lives with husband in 1 story home.  Seat Belt Safety/Bike Helmet: Wears seat belt.   Female:   Pap- 02/25/2015, followed by GYN.   Mammo-10/08/2016, normal.        Dexa scan-01/17/2016, Osteopenia.         CCS-Colonoscopy 03/24/2013, normal. Recall 10 years.      Objective:     Vitals: BP 138/74 (BP Location: Left Arm, Patient Position: Sitting, Cuff Size: Normal)   Pulse (!) 103   Ht 5\' 2"  (1.575 m)   Wt 187 lb (84.8 kg)   SpO2 97%   BMI 34.20 kg/m   Body mass index is 34.2 kg/m.   Tobacco History  Smoking Status  . Never Smoker  Smokeless Tobacco  . Never Used     Counseling given: Not Answered   Past Medical History:  Diagnosis Date  . Acute parotitis 11/2015   hx of:  sialolithiasis suspected by ENT--spont resolved.  . Allergy    SEASONAL  . Anemia   . Anxiety   . Atrophic vaginitis   . Chronic headaches    tension, mirgraines  . Depression   . Diverticulosis   . Duodenal ulcer 1989  . Dysplasia of cervix, low grade (CIN 1)    S/P LEEP  . Endometrial hyperplasia    SIMPLE  . GERD (gastroesophageal reflux disease)   . 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  Much improved at Dr. Michaelle Copas f/u 10/2015.  Marland Kitchen Hiatal hernia   . Hyperlipidemia    LDL goal = < 140.  No meds as of 04/2016.  . Medial meniscus tear 04/2014   R knee  . Osteoarthritis    KNEES  . Osteoporosis    GYN managing; as of  01/2016 pt is on Evista.  She is intol of oral bisphosph due to GERD.  . Other and unspecified ovarian cysts   . Prediabetes 2018   A1c 6% 04/2016   Past Surgical History:  Procedure Laterality Date  . ABDOMINAL SURGERY  2003    EXPLORATORY LAPAROTOMY, LSO  . ADENOIDECTOMY  1954  . CARPAL TUNNEL RELEASE Bilateral 2001  . CHOLECYSTECTOMY  1989   stones  . COLONOSCOPY  2011; 03/2013   2011 NORMAL.  03/2013 TCS done for heme+ stool: no polyps.  Repeat 10 yrs (after 03/2023).  Marland Kitchen DEXA  03/23/2014   Osteopenia.  Pt took prolia x 2 injections, then was switched to evista by her GYN.  Plan for repeat DEXA 01/17/16 per GYN.  Marland Kitchen ENDOMETRIAL BIOPSY  09/27/2015   PATH:  BENIGN  . endoscopy upper GI  2000; 05/2014   negative 2000.  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 Russella Dar is PPI bid long term.  Marland Kitchen HYSTEROSCOPY     UTERINE POLYPS  . MOUTH SURGERY    . OOPHORECTOMY  2003   LSO WITH EXPLORATORY LAPAROTOMY  . TUBAL LIGATION  1987   Family History  Problem Relation Age of Onset  . Heart attack Father        abnormal EKG; ? AVR  . Diabetes Father   . Heart disease Father   . Hyperlipidemia Mother   . Anxiety disorder Mother   . Depression Mother   . Irregular heart beat Sister   . Heart disease Maternal Grandfather   . Heart failure Maternal Grandfather   . Osteoporosis Maternal Grandmother   . Colon cancer Neg Hx    History  Sexual Activity  . Sexual activity: Yes  . Birth control/ protection: Surgical    Outpatient Encounter Prescriptions as of 10/16/2016  Medication Sig  . Calcium Carbonate-Vitamin D 600-400 MG-UNIT per tablet Take 1 tablet by mouth 2 (two) times daily.    . clorazepate (TRANXENE) 7.5 MG tablet Take 7.5 mg by mouth once as needed.    . Magnesium 500 MG CAPS Take 1 capsule by mouth daily.  . Multiple Vitamin (MULTIVITAMIN) tablet Take 1 tablet by mouth daily.    Marland Kitchen. omeprazole (PRILOSEC) 40 MG  capsule TAKE 1 CAPSULE BY MOUTH 2 TIMES A DAY  . pilocarpine (SALAGEN) 5 MG tablet Take 1 tablet (5 mg total) by mouth 2 (two) times daily.  . QUEtiapine (SEROQUEL XR) 400 MG 24 hr tablet Take 1.5 tablets by mouth at bedtime.  . TURMERIC PO Take 1 capsule by mouth daily.  . raloxifene (EVISTA) 60 MG tablet Take 1 tablet (60 mg total) by mouth daily. (Patient not taking: Reported on 10/16/2016)  . Zoster Vac Recomb Adjuvanted [facility](SHINGRIX) injection Inject 0.5 mLs into the muscle once.   No facility-administered encounter medications on file as of 10/16/2016.     Activities of Daily Living In your present state of health, do you have any difficulty performing the following activities: 10/16/2016  Hearing? N  Vision? N  Difficulty concentrating or making decisions? N  Walking or climbing stairs? N  Dressing or bathing? N  Doing errands, shopping? N  Preparing Food and eating ? N  Using the Toilet? N  In the past six months, have you accidently leaked urine? N  Do you have problems with loss of bowel control? N  Managing your Medications? N  Managing your Finances? N  Housekeeping or managing your Housekeeping? N  Some recent data might be hidden    Patient Care Team: Jeoffrey MassedMcGowen, Philip H, MD as PCP - General (Family Medicine) Judi SaaSmith, Zachary M, DO as Consulting Physician (Sports Medicine) Meryl DareStark, Malcolm T, MD as Consulting Physician (Gastroenterology) Suzanna ObeyByers, John, MD as Consulting Physician (Otolaryngology) Milagros EvenerKaur, Rupinder, MD as Consulting Physician (Psychiatry) Genia DelLavoie, Marie-Lyne, MD as Consulting Physician (Obstetrics and Gynecology) Lattie CornsMorse and Ralph Leydenoyle (Dentistry) [facility]    Assessment:    Physical assessment deferred to PCP.  Exercise Activities and Dietary recommendations Current Exercise Habits: Structured exercise class, Time (Minutes): 50, Frequency (Times/Week): 2, Weekly Exercise (Minutes/Week): 100, Exercise limited by: None identified   Diet (meal preparation,  eat out, water intake, caffeinated beverages, dairy products, fruits and vegetables): Drinks diet soda and water.   Breakfast: cereal; eggs; oatmeal Lunch: yogurt, granola bar; sandwich Dinner: protein and vegetables.       Discussed heart healthy diet and staying active.  Goals    . Weight (lb) < 140 lb (63.5 kg)          Lose weight by continuing to watch sweets and be active.       Fall Risk Fall Risk  10/16/2016 10/13/2015  Falls in the past year? No  No   Depression Screen PHQ 2/9 Scores 10/16/2016 10/13/2015  PHQ - 2 Score 1 0     Cognitive Function       Ad8 score reviewed for issues:  Issues making decisions: no  Less interest in hobbies / activities: no  Repeats questions, stories (family complaining): no  Trouble using ordinary gadgets (microwave, computer, phone): no  Forgets the month or year: no  Mismanaging finances: no  Remembering appts: no  Daily problems with thinking and/or memory: no Ad8 score is=0     Immunization History  Administered Date(s) Administered  . Influenza Split 11/21/2010, 11/20/2011  . Influenza Whole 11/26/2007, 11/18/2008, 11/08/2009  . Influenza, High Dose Seasonal PF 10/16/2016  . Influenza,inj,Quad PF,6+ Mos 11/25/2012, 10/21/2013, 10/26/2014, 11/01/2015  . Pneumococcal Conjugate-13 10/13/2015  . Pneumococcal Polysaccharide-23 10/16/2016  . Tdap 05/16/2010  . Zoster 11/15/2009   Screening Tests Health Maintenance  Topic Date Due  . INFLUENZA VACCINE  09/12/2016  . PNA vac Low Risk Adult (2 of 2 - PPSV23) 10/12/2016  . MAMMOGRAM  10/09/2018  . TETANUS/TDAP  05/15/2020  . COLONOSCOPY  03/25/2023  . DEXA SCAN  Completed  . Hepatitis C Screening  Completed      Plan:    Bring a copy of your living will and/or healthcare power of attorney to your next office visit.  Continue doing brain stimulating activities (puzzles, reading, adult coloring books, staying active) to keep memory sharp.   Shingrix Vaccine at  pharmacy.   I have personally reviewed and noted the following in the patient's chart:   . Medical and social history . Use of alcohol, tobacco or illicit drugs  . Current medications and supplements . Functional ability and status . Nutritional status . Physical activity . Advanced directives . List of other physicians . Hospitalizations, surgeries, and ER visits in previous 12 months . Vitals . Screenings to include cognitive, depression, and falls . Referrals and appointments  In addition, I have reviewed and discussed with patient certain preventive protocols, quality metrics, and best practice recommendations. A written personalized care plan for preventive services as well as general preventive health recommendations were provided to patient.     Alysia Penna, RN  10/16/2016

## 2016-10-16 ENCOUNTER — Ambulatory Visit (INDEPENDENT_AMBULATORY_CARE_PROVIDER_SITE_OTHER): Payer: Medicare Other

## 2016-10-16 VITALS — BP 138/74 | HR 103 | Ht 62.0 in | Wt 187.0 lb

## 2016-10-16 DIAGNOSIS — Z0000 Encounter for general adult medical examination without abnormal findings: Secondary | ICD-10-CM | POA: Diagnosis not present

## 2016-10-16 DIAGNOSIS — Z23 Encounter for immunization: Secondary | ICD-10-CM

## 2016-10-16 MED ORDER — ZOSTER VAC RECOMB ADJUVANTED 50 MCG/0.5ML IM SUSR: 0.5000 mL | Freq: Once | INTRAMUSCULAR | 1 refills | Status: AC

## 2016-10-16 NOTE — Patient Instructions (Addendum)
Bring a copy of your living will and/or healthcare power of attorney to your next office visit.  Continue doing brain stimulating activities (puzzles, reading, adult coloring books, staying active) to keep memory sharp.   Shingrix Vaccine at pharmacy.   Fall Prevention in the Home Falls can cause injuries. They can happen to people of all ages. There are many things you can do to make your home safe and to help prevent falls. What can I do on the outside of my home?  Regularly fix the edges of walkways and driveways and fix any cracks.  Remove anything that might make you trip as you walk through a door, such as a raised step or threshold.  Trim any bushes or trees on the path to your home.  Use bright outdoor lighting.  Clear any walking paths of anything that might make someone trip, such as rocks or tools.  Regularly check to see if handrails are loose or broken. Make sure that both sides of any steps have handrails.  Any raised decks and porches should have guardrails on the edges.  Have any leaves, snow, or ice cleared regularly.  Use sand or salt on walking paths during winter.  Clean up any spills in your garage right away. This includes oil or grease spills. What can I do in the bathroom?  Use night lights.  Install grab bars by the toilet and in the tub and shower. Do not use towel bars as grab bars.  Use non-skid mats or decals in the tub or shower.  If you need to sit down in the shower, use a plastic, non-slip stool.  Keep the floor dry. Clean up any water that spills on the floor as soon as it happens.  Remove soap buildup in the tub or shower regularly.  Attach bath mats securely with double-sided non-slip rug tape.  Do not have throw rugs and other things on the floor that can make you trip. What can I do in the bedroom?  Use night lights.  Make sure that you have a light by your bed that is easy to reach.  Do not use any sheets or blankets that are  too big for your bed. They should not hang down onto the floor.  Have a firm chair that has side arms. You can use this for support while you get dressed.  Do not have throw rugs and other things on the floor that can make you trip. What can I do in the kitchen?  Clean up any spills right away.  Avoid walking on wet floors.  Keep items that you use a lot in easy-to-reach places.  If you need to reach something above you, use a strong step stool that has a grab bar.  Keep electrical cords out of the way.  Do not use floor polish or wax that makes floors slippery. If you must use wax, use non-skid floor wax.  Do not have throw rugs and other things on the floor that can make you trip. What can I do with my stairs?  Do not leave any items on the stairs.  Make sure that there are handrails on both sides of the stairs and use them. Fix handrails that are broken or loose. Make sure that handrails are as long as the stairways.  Check any carpeting to make sure that it is firmly attached to the stairs. Fix any carpet that is loose or worn.  Avoid having throw rugs at the top or  bottom of the stairs. If you do have throw rugs, attach them to the floor with carpet tape.  Make sure that you have a light switch at the top of the stairs and the bottom of the stairs. If you do not have them, ask someone to add them for you. What else can I do to help prevent falls?  Wear shoes that: ? Do not have high heels. ? Have rubber bottoms. ? Are comfortable and fit you well. ? Are closed at the toe. Do not wear sandals.  If you use a stepladder: ? Make sure that it is fully opened. Do not climb a closed stepladder. ? Make sure that both sides of the stepladder are locked into place. ? Ask someone to hold it for you, if possible.  Clearly mark and make sure that you can see: ? Any grab bars or handrails. ? First and last steps. ? Where the edge of each step is.  Use tools that help you move  around (mobility aids) if they are needed. These include: ? Canes. ? Walkers. ? Scooters. ? Crutches.  Turn on the lights when you go into a dark area. Replace any light bulbs as soon as they burn out.  Set up your furniture so you have a clear path. Avoid moving your furniture around.  If any of your floors are uneven, fix them.  If there are any pets around you, be aware of where they are.  Review your medicines with your doctor. Some medicines can make you feel dizzy. This can increase your chance of falling. Ask your doctor what other things that you can do to help prevent falls. This information is not intended to replace advice given to you by your health care provider. Make sure you discuss any questions you have with your health care provider. Document Released: 11/25/2008 Document Revised: 07/07/2015 Document Reviewed: 03/05/2014 Elsevier Interactive Patient Education  2018 [employer] Maintenance, Female Adopting a healthy lifestyle and getting preventive care can go a long way to promote health and wellness. Talk with your health care provider about what schedule of regular examinations is right for you. This is a good chance for you to check in with your provider about disease prevention and staying healthy. In between checkups, there are plenty of things you can do on your own. Experts have done a lot of research about which lifestyle changes and preventive measures are most likely to keep you healthy. Ask your health care provider for more information. Weight and diet Eat a healthy diet  Be sure to include plenty of vegetables, fruits, low-fat dairy products, and lean protein.  Do not eat a lot of foods high in solid fats, added sugars, or salt.  Get regular exercise. This is one of the most important things you can do for your health. ? Most adults should exercise for at least 150 minutes each week. The exercise should increase your heart rate and make you  sweat (moderate-intensity exercise). ? Most adults should also do strengthening exercises at least twice a week. This is in addition to the moderate-intensity exercise.  Maintain a healthy weight  Body mass index (BMI) is a measurement that can be used to identify possible weight problems. It estimates body fat based on height and weight. Your health care provider can help determine your BMI and help you achieve or maintain a healthy weight.  For females 75 years of age and older: ? A BMI below 18.5 is considered  underweight. ? A BMI of 18.5 to 24.9 is normal. ? A BMI of 25 to 29.9 is considered overweight. ? A BMI of 30 and above is considered obese.  Watch levels of cholesterol and blood lipids  You should start having your blood tested for lipids and cholesterol at 67 years of age, then have this test every 5 years.  You may need to have your cholesterol levels checked more often if: ? Your lipid or cholesterol levels are high. ? You are older than 67 years of age. ? You are at high risk for heart disease.  Cancer screening Lung Cancer  Lung cancer screening is recommended for adults 70-32 years old who are at high risk for lung cancer because of a history of smoking.  A yearly low-dose CT scan of the lungs is recommended for people who: ? Currently smoke. ? Have quit within the past 15 years. ? Have at least a 30-pack-year history of smoking. A pack year is smoking an average of one pack of cigarettes a day for 1 year.  Yearly screening should continue until it has been 15 years since you quit.  Yearly screening should stop if you develop a health problem that would prevent you from having lung cancer treatment.  Breast Cancer  Practice breast self-awareness. This means understanding how your breasts normally appear and feel.  It also means doing regular breast self-exams. Let your health care provider know about any changes, no matter how small.  If you are in your 20s  or 30s, you should have a clinical breast exam (CBE) by a health care provider every 1-3 years as part of a regular health exam.  If you are 70 or older, have a CBE every year. Also consider having a breast X-ray (mammogram) every year.  If you have a family history of breast cancer, talk to your health care provider about genetic screening.  If you are at high risk for breast cancer, talk to your health care provider about having an MRI and a mammogram every year.  Breast cancer gene (BRCA) assessment is recommended for women who have family members with BRCA-related cancers. BRCA-related cancers include: ? Breast. ? Ovarian. ? Tubal. ? Peritoneal cancers.  Results of the assessment will determine the need for genetic counseling and BRCA1 and BRCA2 testing.  Cervical Cancer Your health care provider may recommend that you be screened regularly for cancer of the pelvic organs (ovaries, uterus, and vagina). This screening involves a pelvic examination, including checking for microscopic changes to the surface of your cervix (Pap test). You may be encouraged to have this screening done every 3 years, beginning at age 71.  For women ages 64-65, health care providers may recommend pelvic exams and Pap testing every 3 years, or they may recommend the Pap and pelvic exam, combined with testing for human papilloma virus (HPV), every 5 years. Some types of HPV increase your risk of cervical cancer. Testing for HPV may also be done on women of any age with unclear Pap test results.  Other health care providers may not recommend any screening for nonpregnant women who are considered low risk for pelvic cancer and who do not have symptoms. Ask your health care provider if a screening pelvic exam is right for you.  If you have had past treatment for cervical cancer or a condition that could lead to cancer, you need Pap tests and screening for cancer for at least 20 years after your treatment. If Pap tests  have been discontinued, your risk factors (such as having a new sexual partner) need to be reassessed to determine if screening should resume. Some women have medical problems that increase the chance of getting cervical cancer. In these cases, your health care provider may recommend more frequent screening and Pap tests.  Colorectal Cancer  This type of cancer can be detected and often prevented.  Routine colorectal cancer screening usually begins at 67 years of age and continues through 67 years of age.  Your health care provider may recommend screening at an earlier age if you have risk factors for colon cancer.  Your health care provider may also recommend using home test kits to check for hidden blood in the stool.  A small camera at the end of a tube can be used to examine your colon directly (sigmoidoscopy or colonoscopy). This is done to check for the earliest forms of colorectal cancer.  Routine screening usually begins at age 57.  Direct examination of the colon should be repeated every 5-10 years through 67 years of age. However, you may need to be screened more often if early forms of precancerous polyps or small growths are found.  Skin Cancer  Check your skin from head to toe regularly.  Tell your health care provider about any new moles or changes in moles, especially if there is a change in a mole's shape or color.  Also tell your health care provider if you have a mole that is larger than the size of a pencil eraser.  Always use sunscreen. Apply sunscreen liberally and repeatedly throughout the day.  Protect yourself by wearing long sleeves, pants, a wide-brimmed hat, and sunglasses whenever you are outside.  Heart disease, diabetes, and high blood pressure  High blood pressure causes heart disease and increases the risk of stroke. High blood pressure is more likely to develop in: ? People who have blood pressure in the high end of the normal range (130-139/85-89 mm  Hg). ? People who are overweight or obese. ? People who are African American.  If you are 6-54 years of age, have your blood pressure checked every 3-5 years. If you are 64 years of age or older, have your blood pressure checked every year. You should have your blood pressure measured twice-once when you are at a hospital or clinic, and once when you are not at a hospital or clinic. Record the average of the two measurements. To check your blood pressure when you are not at a hospital or clinic, you can use: ? An automated blood pressure machine at a pharmacy. ? A home blood pressure monitor.  If you are between 93 years and 82 years old, ask your health care provider if you should take aspirin to prevent strokes.  Have regular diabetes screenings. This involves taking a blood sample to check your fasting blood sugar level. ? If you are at a normal weight and have a low risk for diabetes, have this test once every three years after 67 years of age. ? If you are overweight and have a high risk for diabetes, consider being tested at a younger age or more often. Preventing infection Hepatitis B  If you have a higher risk for hepatitis B, you should be screened for this virus. You are considered at high risk for hepatitis B if: ? You were born in a country where hepatitis B is common. Ask your health care provider which countries are considered high risk. ? Your parents were born  in a high-risk country, and you have not been immunized against hepatitis B (hepatitis B vaccine). ? You have HIV or AIDS. ? You use needles to inject street drugs. ? You live with someone who has hepatitis B. ? You have had sex with someone who has hepatitis B. ? You get hemodialysis treatment. ? You take certain medicines for conditions, including cancer, organ transplantation, and autoimmune conditions.  Hepatitis C  Blood testing is recommended for: ? Everyone born from [birth year] through 1966. ? Anyone with known  risk factors for hepatitis C.  Sexually transmitted infections (STIs)  You should be screened for sexually transmitted infections (STIs) including gonorrhea and chlamydia if: ? You are sexually active and are younger than 67 years of age. ? You are older than 67 years of age and your health care provider tells you that you are at risk for this type of infection. ? Your sexual activity has changed since you were last screened and you are at an increased risk for chlamydia or gonorrhea. Ask your health care provider if you are at risk.  If you do not have HIV, but are at risk, it may be recommended that you take a prescription medicine daily to prevent HIV infection. This is called pre-exposure prophylaxis (PrEP). You are considered at risk if: ? You are sexually active and do not regularly use condoms or know the HIV status of your partner(s). ? You take drugs by injection. ? You are sexually active with a partner who has HIV.  Talk with your health care provider about whether you are at high risk of being infected with HIV. If you choose to begin PrEP, you should first be tested for HIV. You should then be tested every 3 months for as long as you are taking PrEP. Pregnancy  If you are premenopausal and you may become pregnant, ask your health care provider about preconception counseling.  If you may become pregnant, take 400 to 800 micrograms (mcg) of folic acid every day.  If you want to prevent pregnancy, talk to your health care provider about birth control (contraception). Osteoporosis and menopause  Osteoporosis is a disease in which the bones lose minerals and strength with aging. This can result in serious bone fractures. Your risk for osteoporosis can be identified using a bone density scan.  If you are 29 years of age or older, or if you are at risk for osteoporosis and fractures, ask your health care provider if you should be screened.  Ask your health care provider whether you  should take a calcium or vitamin D supplement to lower your risk for osteoporosis.  Menopause may have certain physical symptoms and risks.  Hormone replacement therapy may reduce some of these symptoms and risks. Talk to your health care provider about whether hormone replacement therapy is right for you. Follow these instructions at home:  Schedule regular health, dental, and eye exams.  Stay current with your immunizations.  Do not use any tobacco products including cigarettes, chewing tobacco, or electronic cigarettes.  If you are pregnant, do not drink alcohol.  If you are breastfeeding, limit how much and how often you drink alcohol.  Limit alcohol intake to no more than 1 drink per day for nonpregnant women. One drink equals 12 ounces of beer, 5 ounces of wine, or 1 ounces of hard liquor.  Do not use street drugs.  Do not share needles.  Ask your health care provider for help if you need support or information  about quitting drugs.  Tell your health care provider if you often feel depressed.  Tell your health care provider if you have ever been abused or do not feel safe at home. This information is not intended to replace advice given to you by your health care provider. Make sure you discuss any questions you have with your health care provider. Document Released: 08/14/2010 Document Revised: 07/07/2015 Document Reviewed: 11/02/2014 Elsevier Interactive Patient Education  [employer].

## 2016-10-18 NOTE — Progress Notes (Signed)
AWV reviewed and agree.  Signed:  Phil Khiem Gargis, MD           10/18/2016  

## 2016-11-05 ENCOUNTER — Other Ambulatory Visit: Payer: Self-pay

## 2016-11-05 MED ORDER — RALOXIFENE HCL 60 MG PO TABS
60.0000 mg | ORAL_TABLET | Freq: Every day | ORAL | 0 refills | Status: DC
Start: 2016-11-05 — End: 2017-07-03

## 2016-11-14 ENCOUNTER — Encounter: Payer: Self-pay | Admitting: Family Medicine

## 2016-11-14 ENCOUNTER — Ambulatory Visit (INDEPENDENT_AMBULATORY_CARE_PROVIDER_SITE_OTHER): Payer: Medicare Other | Admitting: Family Medicine

## 2016-11-14 VITALS — BP 115/70 | HR 95 | Temp 98.7°F | Resp 16 | Ht 61.5 in | Wt 186.8 lb

## 2016-11-14 DIAGNOSIS — H9311 Tinnitus, right ear: Secondary | ICD-10-CM | POA: Diagnosis not present

## 2016-11-14 NOTE — Patient Instructions (Addendum)
Take  lexapro every other day for 4 doses, then stop.   Wait two weeks and see if symptom goes away. If it does go away, restart lexapro and see if symptom returns.   If it does NOT go away, you may restart lexapro. Call office with report in 2 weeks, or return for office visit if any new symptoms or questions.

## 2016-11-14 NOTE — Progress Notes (Signed)
OFFICE VISIT  11/14/2016   CC:  Chief Complaint  Patient presents with  . Crackling sound in Right ear    x 2 weeks   HPI:    Patient is a 66 y.o.  female who presents for right ear complaint--"crackling sound". Onset about 2 wks ago.  Loud, intermittent.  Usually lasts 10-15 seconds in duration.  Noise like the water running in kitchen sink seem to increase the intensity of it.  Occ feels disequilibrium when having the sound.  Occ this gets bad enough she feels presyncopal.  No syncope or falls.  No nausea.  No palpitations.  No HAs.  No vertigo.  Using ear wax drops and H2O2 and no help.   Says no pain, no hearing deficit is felt.  No nasal congestion, runny nose, sneezing.  No cough. She flew to [place] 2 mo ago, sx's did not occur during or after flying. No ringing. Has never had this problem in the past. Was put on lexapro 10/22/16--sx's onset about a week after this started.   Past Medical History:  Diagnosis Date  . Acute parotitis 11/2015   hx of:  sialolithiasis suspected by ENT--spont resolved.  . Allergy    SEASONAL  . Anemia   . Anxiety   . Atrophic vaginitis   . Chronic headaches    tension, mirgraines  . Depression   . Diverticulosis   . Duodenal ulcer 1989  . Dysplasia of cervix, low grade (CIN 1)    S/P LEEP  . Endometrial hyperplasia    SIMPLE  . GERD (gastroesophageal reflux disease)   . 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  Much improved at Dr. Michaelle Copas f/u 10/2015.  Marland Kitchen Hiatal hernia   . Hyperlipidemia    LDL goal = < 140.  No meds as of 04/2016.  . Medial meniscus tear 04/2014   R knee  . Osteoarthritis    KNEES  . Osteoporosis    GYN managing; as of 01/2016 pt is on Evista.  She is intol of oral bisphosph due to GERD.  . Other and unspecified ovarian cysts   . Prediabetes 2018   A1c 6% 04/2016    Past Surgical History:  Procedure Laterality Date  . ABDOMINAL SURGERY  2003    EXPLORATORY LAPAROTOMY, LSO  . ADENOIDECTOMY  1954  . CARPAL  TUNNEL RELEASE Bilateral 2001  . CHOLECYSTECTOMY  1989   stones  . COLONOSCOPY  2011; 03/2013   2011 NORMAL.  03/2013 TCS done for heme+ stool: no polyps.  Repeat 10 yrs (after 03/2023).  Marland Kitchen DEXA  03/23/2014   Osteopenia.  Pt took prolia x 2 injections, then was switched to evista by her GYN.  Plan for repeat DEXA 01/17/16 per GYN.  Marland Kitchen ENDOMETRIAL BIOPSY  09/27/2015   PATH:  BENIGN  . endoscopy upper GI  2000; 05/2014   negative 2000.  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 Russella Dar is PPI bid long term.  Marland Kitchen HYSTEROSCOPY     UTERINE POLYPS  . MOUTH SURGERY    . OOPHORECTOMY  2003   LSO WITH EXPLORATORY LAPAROTOMY  . TUBAL LIGATION  1987    Outpatient Medications Prior to Visit  Medication Sig Dispense Refill  . Calcium Carbonate-Vitamin D 600-400 MG-UNIT per tablet Take 1 tablet by mouth 2 (two) times daily.      . clorazepate (TRANXENE) 7.5 MG tablet Take 7.5 mg by mouth once  as needed.      . Magnesium 500 MG CAPS Take 1 capsule by mouth daily.    . Multiple Vitamin (MULTIVITAMIN) tablet Take 1 tablet by mouth daily.      Marland Kitchen omeprazole (PRILOSEC) 40 MG capsule TAKE 1 CAPSULE BY MOUTH 2 TIMES A DAY 180 capsule 0  . pilocarpine (SALAGEN) 5 MG tablet Take 1 tablet (5 mg total) by mouth 2 (two) times daily. 60 tablet 11  . QUEtiapine (SEROQUEL XR) 400 MG 24 hr tablet Take 1.5 tablets by mouth at bedtime.    . raloxifene (EVISTA) 60 MG tablet Take 1 tablet (60 mg total) by mouth daily. 90 tablet 0  . TURMERIC PO Take 1 capsule by mouth daily.     No facility-administered medications prior to visit.     No Known Allergies  ROS As per HPI  PE: Blood pressure 115/70, pulse 95, temperature 98.7 F (37.1 C), temperature source Oral, resp. rate 16, height 5' 1.5" (1.562 m), weight 186 lb 12 oz (84.7 kg), SpO2 97 %. Gen: Alert, well appearing.  Patient is oriented to person, place, time, and situation. AFFECT: pleasant,  lucid thought and speech. ENT: Ears: EACs both completely clear, normal epithelium.  TMs with good light reflex and landmarks bilaterally.  Eyes: no injection, icteris, swelling, or exudate.  EOMI, PERRLA. Nose: no drainage or turbinate edema/swelling.  No injection or focal lesion.  Mouth: lips without lesion/swelling.  Oral mucosa pink and moist.  Dentition intact and without obvious caries or gingival swelling.  Oropharynx without erythema, exudate, or swelling.  CV: RRR, no m/r/g.   LUNGS: CTA bilat, nonlabored resps, good aeration in all lung fields. Neck - No masses or thyromegaly or limitation in range of motion Neuro: CN 2-12 intact bilaterally, strength 5/5 in proximal and distal upper extremities and lower extremities bilaterally.    No tremor.   No ataxia.  Upper extremity and lower extremity DTRs symmetric.  No pronator drift.   LABS:   Audiogram today:  Hearing Screening             Right ear:   Left ear:   Chemistry      Component Value Date/Time   NA 144 04/17/2016 0920   K 4.4 04/17/2016 0920   CL 107 04/17/2016 0920   CO2 30 04/17/2016 0920   BUN 14 04/17/2016 0920   CREATININE 1.01 04/17/2016 0920   CREATININE 0.84 03/10/2013 1128      Component Value Date/Time   CALCIUM 9.5 04/17/2016 0920   ALKPHOS 91 04/17/2016 0920   AST 23 04/17/2016 0920   ALT 19 04/17/2016 0920   BILITOT 0.3 04/17/2016 0920     Lab Results  Component Value Date   HGBA1C 6.0 04/17/2016   IMPRESSION AND PLAN:  1) R ear "crackling/static"--atypical tinnitus, on/off x about 2 weeks.  Question of lexapro side effect, since she started this med about a week prior to onset of sx's. Plan: take lexapro  every other day x 4 doses, then stop it.  Wait two weeks and see if symptom goes away. If it does go away, restart lexapro and see if symptom returns.  If it does not go away, may restart lexapro and  we'll get her to an ENT for further evaluation.  An After Visit Summary was printed and given to the patient.  FOLLOW UP: No Follow-up on  file.  Signed:  Santiago Bumpers, MD           11/14/2016

## 2016-12-18 ENCOUNTER — Telehealth: Payer: Self-pay | Admitting: Family Medicine

## 2016-12-18 NOTE — Telephone Encounter (Signed)
Patient calling to notify pcp that he was correct that the Lexapro was causing the loud, crackling noise in her ear. Her psychologist has since taken her off if.  She is now taking zoloft instead and seems to be doing better.

## 2016-12-18 NOTE — Telephone Encounter (Signed)
FYI

## 2016-12-18 NOTE — Telephone Encounter (Signed)
Noted.  Good news.

## 2017-01-30 ENCOUNTER — Encounter: Payer: Medicare Other | Admitting: Obstetrics & Gynecology

## 2017-01-31 ENCOUNTER — Ambulatory Visit: Payer: Medicare Other | Admitting: Obstetrics & Gynecology

## 2017-01-31 ENCOUNTER — Encounter: Payer: Self-pay | Admitting: Obstetrics & Gynecology

## 2017-01-31 VITALS — BP 120/82 | Ht 61.25 in | Wt 181.0 lb

## 2017-01-31 DIAGNOSIS — Z780 Asymptomatic menopausal state: Secondary | ICD-10-CM | POA: Diagnosis not present

## 2017-01-31 DIAGNOSIS — R7309 Other abnormal glucose: Secondary | ICD-10-CM

## 2017-01-31 DIAGNOSIS — Z01419 Encounter for gynecological examination (general) (routine) without abnormal findings: Secondary | ICD-10-CM

## 2017-01-31 DIAGNOSIS — M8589 Other specified disorders of bone density and structure, multiple sites: Secondary | ICD-10-CM

## 2017-01-31 NOTE — Progress Notes (Signed)
Suzanne CourseKathleen Spencer [birth year] [id number]   History:    66 y.o. G1P1L1  Married.  Son works for [employer] [place].  RP:  Establish patient presenting for annual gyn exam   HPI: Menopause.  Well on no HRT.  No PMB since EBx Benign last year. Sexually active, no problem.  No pelvic pain.  Breasts normal.  Urine and bowel movements normal.  Does Barre exercises twice a week and stationary bike at home 30' about 5 times/week.  Loosing weight x last 2 yrs.  HBA1C slightly high 10/2016.  Past medical history,surgical history, family history and social history were all reviewed and documented in the EPIC chart.  Gynecologic History No LMP recorded. Patient is postmenopausal. Contraception: post menopausal status Last Pap: 2017. Results were: Neg Last mammogram: 09/2016. Results were: Benign Colono 2015 Bone density 2017 Osteopenia.  Lowest T-Score -1.8 at Spine  Obstetric History OB History  Gravida Para Term Preterm AB Living  1 1 1     1   SAB TAB Ectopic Multiple Live Births               # Outcome Date GA Lbr Len/2nd Weight Sex Delivery Anes PTL Lv  1 Term                ROS: A ROS was performed and pertinent positives and negatives are included in the history.  GENERAL: No fevers or chills. HEENT: No change in vision, no earache, sore throat or sinus congestion. NECK: No pain or stiffness. CARDIOVASCULAR: No chest pain or pressure. No palpitations. PULMONARY: No shortness of breath, cough or wheeze. GASTROINTESTINAL: No abdominal pain, nausea, vomiting or diarrhea, melena or bright red blood per rectum. GENITOURINARY: No urinary frequency, urgency, hesitancy or dysuria. MUSCULOSKELETAL: No joint or muscle pain, no back pain, no recent trauma. DERMATOLOGIC: No rash, no itching, no lesions. ENDOCRINE: No polyuria, polydipsia, no heat or cold intolerance. No recent change in weight. HEMATOLOGICAL: No anemia or easy bruising or bleeding. NEUROLOGIC: No headache, seizures, numbness,  tingling or weakness. PSYCHIATRIC: No depression, no loss of interest in normal activity or change in sleep pattern.     Exam:   BP 120/82   Ht 5' 1.25" (1.556 m)   Wt 181 lb (82.1 kg)   BMI 33.92 kg/m   Body mass index is 33.92 kg/m.  General appearance : Well developed well nourished female. No acute distress HEENT: Eyes: no retinal hemorrhage or exudates,  Neck supple, trachea midline, no carotid bruits, no thyroidmegaly Lungs: Clear to auscultation, no rhonchi or wheezes, or rib retractions  Heart: Regular rate and rhythm, no murmurs or gallops Breast:Examined in sitting and supine position were symmetrical in appearance, no palpable masses or tenderness,  no skin retraction, no nipple inversion, no nipple discharge, no skin discoloration, no axillary or supraclavicular lymphadenopathy Abdomen: no palpable masses or tenderness, no rebound or guarding Extremities: no edema or skin discoloration or tenderness  Pelvic: Vulva normal  Bartholin, Urethra, Skene Glands: Within normal limits             Vagina: No gross lesions or discharge  Cervix: No gross lesions or discharge  Uterus  AV, normal size, shape and consistency, non-tender and mobile  Adnexa  Without masses or tenderness  Anus and perineum  normal    Assessment/Plan:  66 y.o. female for annual exam   1. Well female exam with routine gynecological exam Normal gynecologic exam.  Pap Negative well on no hormone replacement therapy.  2017.  Will repeat at 2-3 years.  Breast exam normal.  Screening mammogram negative August 2018.  Colonoscopy in 2015.  Health labs with family physician.  2. Menopause present Well on no hormone replacement therapy.  No postmenopausal bleeding since benign endometrial biopsy.  3. Osteopenia of multiple sites Osteopenia on last bone density with a T score of -1.8.  Recommend vitamin D supplements, calcium rich nutrition and to continue regular weightbearing physical activity.  4.  Elevated hemoglobin A1c Patient's last hemoglobin A1c was at 6.0 3 months ago.  Has been on a low sugar diet.  Will repeat hemoglobin A1c today. - HgB A1c  Genia DelMarie-Lyne Jaymee Tilson MD, 10:18 AM 01/31/2017

## 2017-02-01 LAB — HEMOGLOBIN A1C
Hgb A1c MFr Bld: 5.6 %{Hb}
Mean Plasma Glucose: 114 (calc)
eAG (mmol/L): 6.3 (calc)

## 2017-02-03 ENCOUNTER — Encounter: Payer: Self-pay | Admitting: Obstetrics & Gynecology

## 2017-02-03 NOTE — Patient Instructions (Signed)
1. Well female exam with routine gynecological exam Normal gynecologic exam.  Pap Negative well on no hormone replacement therapy.  2017.  Will repeat at 2-3 years.  Breast exam normal.  Screening mammogram negative August 2018.  Colonoscopy in 2015.  Health labs with family physician.  2. Menopause present Well on no hormone replacement therapy.  No postmenopausal bleeding since benign endometrial biopsy.  3. Osteopenia of multiple sites Osteopenia on last bone density with a T score of -1.8.  Recommend vitamin D supplements, calcium rich nutrition and to continue regular weightbearing physical activity.  4. Elevated hemoglobin A1c Patient's last hemoglobin A1c was at 6.0 3 months ago.  Has been on a low sugar diet.  Will repeat hemoglobin A1c today. - HgB A1c  Suzanne Spencer, it was a pleasure meeting you today!   Health Maintenance for Postmenopausal Women Menopause is a normal process in which your reproductive ability comes to an end. This process happens gradually over a span of months to years, usually between the ages of 41 and 56. Menopause is complete when you have missed 12 consecutive menstrual periods. It is important to talk with your health care provider about some of the most common conditions that affect postmenopausal women, such as heart disease, cancer, and bone loss (osteoporosis). Adopting a healthy lifestyle and getting preventive care can help to promote your health and wellness. Those actions can also lower your chances of developing some of these common conditions. What should I know about menopause? During menopause, you may experience a number of symptoms, such as:  Moderate-to-severe hot flashes.  Night sweats.  Decrease in sex drive.  Mood swings.  Headaches.  Tiredness.  Irritability.  Memory problems.  Insomnia.  Choosing to treat or not to treat menopausal changes is an individual decision that you make with your health care provider. What should I  know about hormone replacement therapy and supplements? Hormone therapy products are effective for treating symptoms that are associated with menopause, such as hot flashes and night sweats. Hormone replacement carries certain risks, especially as you become older. If you are thinking about using estrogen or estrogen with progestin treatments, discuss the benefits and risks with your health care provider. What should I know about heart disease and stroke? Heart disease, heart attack, and stroke become more likely as you age. This may be due, in part, to the hormonal changes that your body experiences during menopause. These can affect how your body processes dietary fats, triglycerides, and cholesterol. Heart attack and stroke are both medical emergencies. There are many things that you can do to help prevent heart disease and stroke:  Have your blood pressure checked at least every 1-2 years. High blood pressure causes heart disease and increases the risk of stroke.  If you are 4-85 years old, ask your health care provider if you should take aspirin to prevent a heart attack or a stroke.  Do not use any tobacco products, including cigarettes, chewing tobacco, or electronic cigarettes. If you need help quitting, ask your health care provider.  It is important to eat a healthy diet and maintain a healthy weight. ? Be sure to include plenty of vegetables, fruits, low-fat dairy products, and lean protein. ? Avoid eating foods that are high in solid fats, added sugars, or salt (sodium).  Get regular exercise. This is one of the most important things that you can do for your health. ? Try to exercise for at least 150 minutes each week. The type of exercise  that you do should increase your heart rate and make you sweat. This is known as moderate-intensity exercise. ? Try to do strengthening exercises at least twice each week. Do these in addition to the moderate-intensity exercise.  Know your  numbers.Ask your health care provider to check your cholesterol and your blood glucose. Continue to have your blood tested as directed by your health care provider.  What should I know about cancer screening? There are several types of cancer. Take the following steps to reduce your risk and to catch any cancer development as early as possible. Breast Cancer  Practice breast self-awareness. ? This means understanding how your breasts normally appear and feel. ? It also means doing regular breast self-exams. Let your health care provider know about any changes, no matter how small.  If you are 69 or older, have a clinician do a breast exam (clinical breast exam or CBE) every year. Depending on your age, family history, and medical history, it may be recommended that you also have a yearly breast X-ray (mammogram).  If you have a family history of breast cancer, talk with your health care provider about genetic screening.  If you are at high risk for breast cancer, talk with your health care provider about having an MRI and a mammogram every year.  Breast cancer (BRCA) gene test is recommended for women who have family members with BRCA-related cancers. Results of the assessment will determine the need for genetic counseling and BRCA1 and for BRCA2 testing. BRCA-related cancers include these types: ? Breast. This occurs in males or females. ? Ovarian. ? Tubal. This may also be called fallopian tube cancer. ? Cancer of the abdominal or pelvic lining (peritoneal cancer). ? Prostate. ? Pancreatic.  Cervical, Uterine, and Ovarian Cancer Your health care provider may recommend that you be screened regularly for cancer of the pelvic organs. These include your ovaries, uterus, and vagina. This screening involves a pelvic exam, which includes checking for microscopic changes to the surface of your cervix (Pap test).  For women ages 21-65, health care providers may recommend a pelvic exam and a Pap  test every three years. For women ages 4-65, they may recommend the Pap test and pelvic exam, combined with testing for human papilloma virus (HPV), every five years. Some types of HPV increase your risk of cervical cancer. Testing for HPV may also be done on women of any age who have unclear Pap test results.  Other health care providers may not recommend any screening for nonpregnant women who are considered low risk for pelvic cancer and have no symptoms. Ask your health care provider if a screening pelvic exam is right for you.  If you have had past treatment for cervical cancer or a condition that could lead to cancer, you need Pap tests and screening for cancer for at least 20 years after your treatment. If Pap tests have been discontinued for you, your risk factors (such as having a new sexual partner) need to be reassessed to determine if you should start having screenings again. Some women have medical problems that increase the chance of getting cervical cancer. In these cases, your health care provider may recommend that you have screening and Pap tests more often.  If you have a family history of uterine cancer or ovarian cancer, talk with your health care provider about genetic screening.  If you have vaginal bleeding after reaching menopause, tell your health care provider.  There are currently no reliable tests available to  screen for ovarian cancer.  Lung Cancer Lung cancer screening is recommended for adults 71-62 years old who are at high risk for lung cancer because of a history of smoking. A yearly low-dose CT scan of the lungs is recommended if you:  Currently smoke.  Have a history of at least 30 pack-years of smoking and you currently smoke or have quit within the past 15 years. A pack-year is smoking an average of one pack of cigarettes per day for one year.  Yearly screening should:  Continue until it has been 15 years since you quit.  Stop if you develop a health  problem that would prevent you from having lung cancer treatment.  Colorectal Cancer  This type of cancer can be detected and can often be prevented.  Routine colorectal cancer screening usually begins at age 70 and continues through age 80.  If you have risk factors for colon cancer, your health care provider may recommend that you be screened at an earlier age.  If you have a family history of colorectal cancer, talk with your health care provider about genetic screening.  Your health care provider may also recommend using home test kits to check for hidden blood in your stool.  A small camera at the end of a tube can be used to examine your colon directly (sigmoidoscopy or colonoscopy). This is done to check for the earliest forms of colorectal cancer.  Direct examination of the colon should be repeated every 5-10 years until age 5. However, if early forms of precancerous polyps or small growths are found or if you have a family history or genetic risk for colorectal cancer, you may need to be screened more often.  Skin Cancer  Check your skin from head to toe regularly.  Monitor any moles. Be sure to tell your health care provider: ? About any new moles or changes in moles, especially if there is a change in a mole's shape or color. ? If you have a mole that is larger than the size of a pencil eraser.  If any of your family members has a history of skin cancer, especially at a young age, talk with your health care provider about genetic screening.  Always use sunscreen. Apply sunscreen liberally and repeatedly throughout the day.  Whenever you are outside, protect yourself by wearing long sleeves, pants, a wide-brimmed hat, and sunglasses.  What should I know about osteoporosis? Osteoporosis is a condition in which bone destruction happens more quickly than new bone creation. After menopause, you may be at an increased risk for osteoporosis. To help prevent osteoporosis or the  bone fractures that can happen because of osteoporosis, the following is recommended:  If you are 83-40 years old, get at least 1,000 mg of calcium and at least 600 mg of vitamin D per day.  If you are older than age 19 but younger than age 72, get at least 1,200 mg of calcium and at least 600 mg of vitamin D per day.  If you are older than age 20, get at least 1,200 mg of calcium and at least 800 mg of vitamin D per day.  Smoking and excessive alcohol intake increase the risk of osteoporosis. Eat foods that are rich in calcium and vitamin D, and do weight-bearing exercises several times each week as directed by your health care provider. What should I know about how menopause affects my mental health? Depression may occur at any age, but it is more common as you  become older. Common symptoms of depression include:  Low or sad mood.  Changes in sleep patterns.  Changes in appetite or eating patterns.  Feeling an overall lack of motivation or enjoyment of activities that you previously enjoyed.  Frequent crying spells.  Talk with your health care provider if you think that you are experiencing depression. What should I know about immunizations? It is important that you get and maintain your immunizations. These include:  Tetanus, diphtheria, and pertussis (Tdap) booster vaccine.  Influenza every year before the flu season begins.  Pneumonia vaccine.  Shingles vaccine.  Your health care provider may also recommend other immunizations. This information is not intended to replace advice given to you by your health care provider. Make sure you discuss any questions you have with your health care provider. Document Released: 03/23/2005 Document Revised: 08/19/2015 Document Reviewed: 11/02/2014 Elsevier Interactive Patient Education  2018 [employer].

## 2017-04-02 ENCOUNTER — Other Ambulatory Visit: Payer: Self-pay

## 2017-04-02 MED ORDER — PILOCARPINE HCL 5 MG PO TABS: 5.0000 mg | ORAL_TABLET | Freq: Two times a day (BID) | ORAL | 11 refills | Status: DC

## 2017-04-02 NOTE — Telephone Encounter (Signed)
Refill on pilocarpine sent to pharmacy

## 2017-07-02 ENCOUNTER — Other Ambulatory Visit: Payer: Self-pay

## 2017-07-02 ENCOUNTER — Other Ambulatory Visit: Payer: Self-pay | Admitting: Gynecology

## 2017-07-03 ENCOUNTER — Telehealth: Payer: Self-pay | Admitting: *Deleted

## 2017-07-03 MED ORDER — RALOXIFENE HCL 60 MG PO TABS: 60.0000 mg | ORAL_TABLET | Freq: Every day | ORAL | 3 refills | Status: DC

## 2017-07-03 NOTE — Telephone Encounter (Signed)
Rx sent 

## 2017-07-03 NOTE — Telephone Encounter (Signed)
Patient called had annual back in 01/2017 and Rx for Evist 60 mg tablet was never sent. Pt said Dr.Fernandez started on medication back in 2017. Okay to refill?

## 2017-07-03 NOTE — Telephone Encounter (Signed)
Yes, agree with refill of Evista 60 mg daily.

## 2017-09-04 ENCOUNTER — Ambulatory Visit: Payer: Medicare Other | Admitting: Family Medicine

## 2017-09-04 ENCOUNTER — Encounter: Payer: Self-pay | Admitting: Family Medicine

## 2017-09-04 ENCOUNTER — Ambulatory Visit (HOSPITAL_BASED_OUTPATIENT_CLINIC_OR_DEPARTMENT_OTHER)
Admission: RE | Admit: 2017-09-04 | Discharge: 2017-09-04 | Disposition: A | Discharge status: Home / Self Care | Payer: Medicare Other | Source: Ambulatory Visit | Attending: Family Medicine | Admitting: Family Medicine

## 2017-09-04 VITALS — BP 124/68 | HR 92 | Temp 98.8°F | Resp 16 | Ht 61.25 in | Wt 195.2 lb

## 2017-09-04 DIAGNOSIS — R0789 Other chest pain: Secondary | ICD-10-CM

## 2017-09-04 DIAGNOSIS — S2231XA Fracture of one rib, right side, initial encounter for closed fracture: Secondary | ICD-10-CM | POA: Insufficient documentation

## 2017-09-04 DIAGNOSIS — X58XXXA Exposure to other specified factors, initial encounter: Secondary | ICD-10-CM | POA: Insufficient documentation

## 2017-09-04 DIAGNOSIS — S2239XA Fracture of one rib, unspecified side, initial encounter for closed fracture: Secondary | ICD-10-CM

## 2017-09-04 HISTORY — DX: Fracture of one rib, unspecified side, initial encounter for closed fracture: S22.39XA

## 2017-09-04 MED ORDER — HYDROCODONE-ACETAMINOPHEN 5-325 MG PO TABS: ORAL_TABLET | ORAL | 0 refills | Status: DC

## 2017-09-04 NOTE — Patient Instructions (Signed)
Take 3 over the counter ibuprofen three times per day with food for the next 5 days.

## 2017-09-04 NOTE — Progress Notes (Signed)
OFFICE VISIT  09/04/2017   CC:  Chief Complaint  Patient presents with  . Chest Pain    fell 4 days ago    HPI:    Patient is a 67 y.o.  female with osteoporosis who presents for rib pain sustained from a recent fall. Five d/a was walking on sidewalk in [place], [place], tripped over curb, fell over on front of body/hands.  Did not hit her head. Since that time she has had signif pain in R chest wall inferior to breast.  No bruising has been noted in the area. Hurts more with deep breath and with bending over or changing position of torso.  No SOB. No fevers.    Past Medical History:  Diagnosis Date  . Acute parotitis 11/2015   hx of:  sialolithiasis suspected by ENT--spont resolved.  . Allergy    SEASONAL  . Anemia   . Anxiety   . Atrophic vaginitis   . Cervical cancer screening    PAP normal 2017: GYN to repeat 2020.  Marland Kitchen. Chronic headaches    tension, mirgraines  . Depression   . Diverticulosis   . Duodenal ulcer 1989  . Dysplasia of cervix, low grade (CIN 1)    S/P LEEP  . Endometrial hyperplasia    SIMPLE  . GERD (gastroesophageal reflux disease)   . 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  Much improved at Dr. Michaelle CopasSmith's f/u 10/2015.  Marland Kitchen. Hiatal hernia   . Hyperlipidemia    LDL goal = < 140.  No meds as of 04/2016.  . Medial meniscus tear 04/2014   R knee  . Osteoarthritis    KNEES  . Osteoporosis    GYN managing; as of 01/2016 pt is on Evista.  She is intol of oral bisphosph due to GERD.  . Other and unspecified ovarian cysts   . Prediabetes 2018   A1c 6% 04/2016    Past Surgical History:  Procedure Laterality Date  . ABDOMINAL SURGERY  2003    EXPLORATORY LAPAROTOMY, LSO  . ADENOIDECTOMY  1954  . CARPAL TUNNEL RELEASE Bilateral 2001  . CHOLECYSTECTOMY  1989   stones  . COLONOSCOPY  2011; 03/2013   2011 NORMAL.  03/2013 TCS done for heme+ stool: no polyps.  Repeat 10 yrs (after 03/2023).  Marland Kitchen. DEXA  03/23/2014   Osteopenia.  Pt took prolia x 2 injections, then  was switched to evista by her GYN.  DEXA 01/17/16 T score a bit worse, same regimen rec'd, repeat DEXA 1 yr per GYN.  Marland Kitchen. ENDOMETRIAL BIOPSY  09/27/2015   PATH:  BENIGN  . endoscopy upper GI  2000; 05/2014   negative 2000.  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 Russella DarStark is PPI bid long term.  Marland Kitchen. HYSTEROSCOPY     UTERINE POLYPS  . MOUTH SURGERY    . OOPHORECTOMY  2003   LSO WITH EXPLORATORY LAPAROTOMY  . TUBAL LIGATION  1987    Outpatient Medications Prior to Visit  Medication Sig Dispense Refill  . Calcium Carbonate-Vitamin D 600-400 MG-UNIT per tablet Take 1 tablet by mouth 2 (two) times daily.      . clorazepate (TRANXENE) 7.5 MG tablet Take 7.5 mg by mouth once as needed.      . Magnesium 500 MG CAPS Take 1 capsule by mouth daily.    . Multiple Vitamin (MULTIVITAMIN) tablet Take 1 tablet by mouth daily.      .Marland Kitchen  omeprazole (PRILOSEC) 40 MG capsule TAKE 1 CAPSULE BY MOUTH 2 TIMES A DAY 180 capsule 0  . QUEtiapine (SEROQUEL XR) 400 MG 24 hr tablet Take 1.5 tablets by mouth at bedtime.    . raloxifene (EVISTA) 60 MG tablet Take 1 tablet (60 mg total) by mouth daily. 90 tablet 3  . TURMERIC PO Take 1 capsule by mouth daily.    . sertraline (ZOLOFT) 100 MG tablet Take 2 tablets by mouth daily.  4  . pilocarpine (SALAGEN) 5 MG tablet Take 1 tablet (5 mg total) by mouth 2 (two) times daily. (Patient not taking: Reported on 09/04/2017) 60 tablet 11   No facility-administered medications prior to visit.     No Known Allergies  ROS As per HPI  PE: Blood pressure 124/68, pulse 92, temperature 98.8 F (37.1 C), temperature source Oral, resp. rate 16, height 5' 1.25" (1.556 m), weight 195 lb 4 oz (88.6 kg), SpO2 94 %.  Pt examined with Pryor Ochoa, CMA, as chaperone.  Gen: Alert, well appearing.  Patient is oriented to person, place, time, and situation. AFFECT: pleasant, lucid thought and speech. CV: RRR, no  m/r/g.   LUNGS: CTA bilat, nonlabored resps, good aeration in all lung fields. Lower ribs TTP under R breast in mid clavicular line.  LABS:  none  IMPRESSION AND PLAN:  Right chest wall contusion, r/o rib fracture. Take 3 over the counter ibuprofen three times per day with food for the next 5 days. Vicodin 5/325, 1-2 tid prn, #42 eRx'd. Ribs xray ordered today.  An After Visit Summary was printed and given to the patient.  FOLLOW UP: Return if symptoms worsen or fail to improve.  Signed:  Santiago Bumpers, MD           09/04/2017

## 2017-09-09 ENCOUNTER — Telehealth: Payer: Self-pay

## 2017-09-09 NOTE — Telephone Encounter (Signed)
Copied from CRM [id number]. Topic: General - Other >> Sep 09, 2017  2:30 PM Gaynelle AduPoole, Shalonda wrote: Reason for CRM: Patient is calling to see if its okay for her to use a  stationary exercise bike  that moves her legs and arm. She requesting a nurse to call her back. Please advise

## 2017-09-09 NOTE — Telephone Encounter (Signed)
Please advise. Thanks.  

## 2017-09-09 NOTE — Telephone Encounter (Signed)
OK to use if it doesn't cause her R sided rib/chest pain to worsen. If it DOES worsen this pain, do stationary bike using legs only.-thx

## 2017-09-10 NOTE — Telephone Encounter (Signed)
Pt advised and voiced understanding.   

## 2017-09-10 NOTE — Telephone Encounter (Signed)
Left message for pt to call back  °

## 2017-09-12 DIAGNOSIS — D509 Iron deficiency anemia, unspecified: Secondary | ICD-10-CM

## 2017-09-12 HISTORY — DX: Iron deficiency anemia, unspecified: D50.9

## 2017-09-18 ENCOUNTER — Ambulatory Visit: Payer: Medicare Other | Admitting: Family Medicine

## 2017-09-18 ENCOUNTER — Encounter: Payer: Self-pay | Admitting: Family Medicine

## 2017-09-18 VITALS — BP 116/69 | HR 98 | Temp 98.6°F | Resp 16 | Ht 61.25 in | Wt 191.2 lb

## 2017-09-18 DIAGNOSIS — S2231XD Fracture of one rib, right side, subsequent encounter for fracture with routine healing: Secondary | ICD-10-CM | POA: Diagnosis not present

## 2017-09-18 NOTE — Progress Notes (Signed)
OFFICE VISIT  09/18/2017   CC:  Chief Complaint  Patient presents with  . Follow-up    Rib Fracture     HPI:    Patient is a 67 y.o. Caucasian female who presents for 2 week f/u for right sided 6th rib fracture sustained after tripping and falling onto chest wall.  I recommended a 5d course of ibuprofen +rx'd vicodin to take prn.  She still has pain but gradually improving.  No SOB, cough, or fevers. Taking NSAIDs prn. She occasionally has to use vicodin. Taking deep breaths.  Does normal activities except exercising at the [facility].  She can do exercise bike w/out problem.   09/04/17: Rib x-rays: EXAM: RIGHT RIBS - 2 VIEW  COMPARISON:  None in PACs  FINDINGS: The patient has sustained an acute fracture of the anterolateral aspect of the right sixth rib. There is no pleural effusion or pneumothorax. The interstitial markings of the right lung are coarse.  IMPRESSION: There is an acute mildly displaced fracture of the anterolateral aspect of the right sixth rib.  Past Medical History:  Diagnosis Date  . Acute parotitis 11/2015   hx of:  sialolithiasis suspected by ENT--spont resolved.  . Allergy    SEASONAL  . Anemia   . Anxiety   . Atrophic vaginitis   . Cervical cancer screening    PAP normal 2017: GYN to repeat 2020.  Marland Kitchen Chronic headaches    tension, mirgraines  . Depression   . Diverticulosis   . Duodenal ulcer 1989  . Dysplasia of cervix, low grade (CIN 1)    S/P LEEP  . Endometrial hyperplasia    SIMPLE  . GERD (gastroesophageal reflux disease)   . 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  Much improved at Dr. Michaelle Copas f/u 10/2015.  Marland Kitchen Hiatal hernia   . Hyperlipidemia    LDL goal = < 140.  No meds as of 04/2016.  . Medial meniscus tear 04/2014   R knee  . Osteoarthritis    KNEES  . Osteoporosis    GYN managing; as of 01/2016 pt is on Evista.  She is intol of oral bisphosph due to GERD.  . Other and unspecified ovarian cysts   . Prediabetes  2018   A1c 6% 04/2016  . Rib fracture 09/04/2017   Mildly displaced R 6th rib fx    Past Surgical History:  Procedure Laterality Date  . ABDOMINAL SURGERY  2003    EXPLORATORY LAPAROTOMY, LSO  . ADENOIDECTOMY  1954  . CARPAL TUNNEL RELEASE Bilateral 2001  . CHOLECYSTECTOMY  1989   stones  . COLONOSCOPY  2011; 03/2013   2011 NORMAL.  03/2013 TCS done for heme+ stool: no polyps.  Repeat 10 yrs (after 03/2023).  Marland Kitchen DEXA  03/23/2014   Osteopenia.  Pt took prolia x 2 injections, then was switched to evista by her GYN.  DEXA 01/17/16 T score a bit worse, same regimen rec'd, repeat DEXA 1 yr per GYN.  Marland Kitchen ENDOMETRIAL BIOPSY  09/27/2015   PATH:  BENIGN  . endoscopy upper GI  2000; 05/2014   negative 2000.  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 Russella Dar is PPI bid long term.  Marland Kitchen HYSTEROSCOPY     UTERINE POLYPS  . MOUTH SURGERY    . OOPHORECTOMY  2003   LSO WITH EXPLORATORY LAPAROTOMY  . TUBAL LIGATION  1987    Outpatient Medications Prior to Visit  Medication Sig Dispense Refill  . Calcium Carbonate-Vitamin D 600-400 MG-UNIT per tablet Take 1 tablet by mouth 2 (two) times daily.      . clorazepate (TRANXENE) 7.5 MG tablet Take 7.5 mg by mouth once as needed.      Marland Kitchen. HYDROcodone-acetaminophen (NORCO/VICODIN) 5-325 MG tablet 1-2 tabs tid prn severe pain 42 tablet 0  . Magnesium 500 MG CAPS Take 1 capsule by mouth daily.    . Multiple Vitamin (MULTIVITAMIN) tablet Take 1 tablet by mouth daily.      Marland Kitchen. omeprazole (PRILOSEC) 40 MG capsule TAKE 1 CAPSULE BY MOUTH 2 TIMES A DAY 180 capsule 0  . QUEtiapine (SEROQUEL XR) 400 MG 24 hr tablet Take 1.5 tablets by mouth at bedtime.    . raloxifene (EVISTA) 60 MG tablet Take 1 tablet (60 mg total) by mouth daily. 90 tablet 3  . sertraline (ZOLOFT) 100 MG tablet Take 2 tablets by mouth daily.  4  . TURMERIC PO Take 1 capsule by mouth daily.     No facility-administered medications  prior to visit.     No Known Allergies  ROS As per HPI  PE: Blood pressure 116/69, pulse 98, temperature 98.6 F (37 C), temperature source Oral, resp. rate 16, height 5' 1.25" (1.556 m), weight 191 lb 4 oz (86.8 kg), SpO2 96 %.  Pt examined with Pryor OchoaHeather Sutherland, CMA, as chaperone.  Gen: Alert, well appearing.  Patient is oriented to person, place, time, and situation. AFFECT: pleasant, lucid thought and speech. Tenderness along R anterolateral aspect of lower rib cage. Breath sounds equal/symmetric--normal.  Breathing nonlabored. RRR, no m/r/g.  LABS:  none  IMPRESSION AND PLAN:  Right 6th rib mildly displaced fracture, routine healing. Continue with activity mod, prn ibup, prn vicodin for severe pain (no new rx given today), ice prn. Back [facility] when she is completely w/out pain.  An After Visit Summary was printed and given to the patient.  FOLLOW UP: Return if symptoms worsen or fail to improve.  Signed:  Santiago BumpersPhil Aylee Littrell, MD           09/18/2017

## 2017-09-24 ENCOUNTER — Other Ambulatory Visit: Payer: Self-pay | Admitting: Family Medicine

## 2017-09-24 NOTE — Telephone Encounter (Signed)
Looks like Dr. Melene MullerStart is managing pts omeprazole.  Pt also requesting refill for hydr/apap.  SW pt she stated that at her last visit she didn't think that she would need a RF but she is still hurting and is requesting refill.   Please advise. Thanks.

## 2017-09-24 NOTE — Telephone Encounter (Signed)
Copied from CRM [id number]. Topic: Quick Communication - Rx Refill/Question >> Sep 24, 2017 11:37 AM Gaynelle AduPoole, Shalonda wrote: Medication: omeprazole (PRILOSEC) 40 MG capsule; HYDROcodone-acetaminophen (NORCO/VICODIN) 5-325 MG tablet     Has the patient contacted their pharmacy?yes   Preferred Pharmacy (with phone number or street name):   [facility] [phone] - [place], [place] - [phone] (Phone) [phone] (Fax)    Agent: Please be advised that RX refills may take up to 3 business days. We ask that you follow-up with your pharmacy.

## 2017-09-25 ENCOUNTER — Telehealth: Payer: Self-pay | Admitting: Gastroenterology

## 2017-09-25 MED ORDER — HYDROCODONE-ACETAMINOPHEN 5-325 MG PO TABS: ORAL_TABLET | ORAL | 0 refills | Status: DC

## 2017-09-25 NOTE — Telephone Encounter (Signed)
Pts husband advised and voiced understanding, okay per DPR.  

## 2017-09-25 NOTE — Telephone Encounter (Signed)
I have advised the patient that unfortunately, we are unable to fill omeprazole as we have not seen her in over 2 years. She is scheduled to see Dr Russella DarStark on 11/14/17 and can get refills at that time. She will get OTC med until that time.

## 2017-10-07 ENCOUNTER — Other Ambulatory Visit: Payer: Self-pay

## 2017-10-07 MED ORDER — OMEPRAZOLE 40 MG PO CPDR: DELAYED_RELEASE_CAPSULE | ORAL | 0 refills | Status: DC

## 2017-10-08 LAB — BASIC METABOLIC PANEL
BUN: 19 (ref 4–21)
CREATININE: 19 — AB (ref 0.5–1.1)
Glucose: 94
Potassium: 4.5 (ref 3.4–5.3)
Sodium: 141 (ref 137–147)

## 2017-10-08 LAB — LAB REPORT - SCANNED
FOLATE: 12.7
VITAMIN B12: 718

## 2017-10-08 LAB — CBC AND DIFFERENTIAL
HEMATOCRIT: 33 — AB (ref 36–46)
Hemoglobin: 10.6 — AB (ref 12.0–16.0)
Neutrophils Absolute: 2734
PLATELETS: 14 — AB (ref 150–399)

## 2017-10-08 LAB — HEMOGLOBIN A1C: Hgb A1c MFr Bld: 5.7 (ref 4.0–6.0)

## 2017-10-08 LAB — HEPATIC FUNCTION PANEL
ALK PHOS: 96 (ref 25–125)
ALT: 18 (ref 7–35)
AST: 21 (ref 13–35)
BILIRUBIN, TOTAL: 0.2

## 2017-10-08 LAB — LIPID PANEL
CHOLESTEROL: 208 — AB (ref 0–200)
HDL: 87 — AB (ref 35–70)
LDL CALC: 105
Triglycerides: 72 (ref 40–160)

## 2017-10-08 LAB — TSH: TSH: 1.38 (ref 0.41–5.90)

## 2017-10-10 LAB — HM MAMMOGRAPHY

## 2017-10-11 ENCOUNTER — Telehealth: Payer: Self-pay

## 2017-10-11 NOTE — Telephone Encounter (Signed)
Copied from CRM [id number]. Topic: Quick Communication - Lab Results >> Oct 11, 2017  2:04 PM Waymon AmatoBurton, Donna F wrote: Pt has lab results from her psycologist and they recommend that pt see her pcp for levels that she has circled and the patient is concerned and could  Someone call her to explain this   Best number [phone]  Spoke with patient and appointment scheduled to talk to PCP.

## 2017-10-11 NOTE — Telephone Encounter (Signed)
Appointment scheduled per patient request

## 2017-10-16 ENCOUNTER — Encounter: Payer: Self-pay | Admitting: Family Medicine

## 2017-10-17 ENCOUNTER — Encounter: Payer: Self-pay | Admitting: Family Medicine

## 2017-10-17 ENCOUNTER — Ambulatory Visit (INDEPENDENT_AMBULATORY_CARE_PROVIDER_SITE_OTHER): Payer: Medicare Other | Admitting: Family Medicine

## 2017-10-17 VITALS — BP 119/72 | HR 92 | Temp 98.4°F | Resp 16 | Ht 61.25 in | Wt 188.1 lb

## 2017-10-17 DIAGNOSIS — R519 Headache, unspecified: Secondary | ICD-10-CM

## 2017-10-17 DIAGNOSIS — E669 Obesity, unspecified: Secondary | ICD-10-CM

## 2017-10-17 DIAGNOSIS — R51 Headache: Secondary | ICD-10-CM

## 2017-10-17 DIAGNOSIS — D649 Anemia, unspecified: Secondary | ICD-10-CM | POA: Diagnosis not present

## 2017-10-17 LAB — CBC
HCT: 35.5 % — ABNORMAL LOW (ref 36.0–46.0)
Hemoglobin: 11.4 g/dL — ABNORMAL LOW (ref 12.0–15.0)
MCHC: 32.2 g/dL (ref 30.0–36.0)
MCV: 81.5 fl (ref 78.0–100.0)
Platelets: 182 10*3/uL (ref 150.0–400.0)
RBC: 4.36 Mil/uL (ref 3.87–5.11)
RDW: 16.2 % — ABNORMAL HIGH (ref 11.5–15.5)
WBC: 5.8 10*3/uL (ref 4.0–10.5)

## 2017-10-17 LAB — FOLATE: Folate: >24.1 ng/mL (ref >5.9)

## 2017-10-17 LAB — FERRITIN: FERRITIN: 14.4 ng/mL (ref 10.0–291.0)

## 2017-10-17 LAB — IRON: Iron: 58 ug/dL (ref 42–145)

## 2017-10-17 NOTE — Patient Instructions (Signed)
Stop excedrin and avoid over the counter pain meds except for tylenol.  Start 100 mg vitamin B2 (riboflavin) once a day to help with headache prevention.

## 2017-10-17 NOTE — Progress Notes (Signed)
OFFICE VISIT  10/17/2017   CC:  Chief Complaint  Patient presents with  . Abnormal Lab    wants to discuss lab work done by Dr. Evelene Croon    HPI:    Patient is a 67 y.o.  female with a hx of prediabetes and mild hypercholesterolemia (TLC) who presents for "discuss labs from psychiatrist" (Dr. Evelene Croon).  Labs reviewed today, dated 10/07/17:  TSH normal, Lipid panel normal, CMET normal, HbA1c 5.7%. Her CBC showed normocytic anemia (Hb 10.6, MCV 83).  WBC, diff, and platelets normal.  No vag bleeding, melena/hematochezia, or gross hematuria. No nosebleeds or excessive bruising.  Modifying diet some lately to try to eat lower carbs, less red meat-->but only for the last couple months. Dietary intake of iron and B12 are normal.  She takes no supplements other than calcium and a daily MVI.   She takes excedrin migraine 2 tabs approx five days a week, usually once a day but occ an extra dose. Energy level is good. She has GERD that is well controlled by 80mg  omeprazole qhs (Per GI).  EGD in 2016 with some evidence of past duodenal ulcers.  Colonoscopies in the past have been unconcerning and next one is due 2025.  Past Medical History:  Diagnosis Date  . Acute parotitis 11/2015   hx of:  sialolithiasis suspected by ENT--spont resolved.  . Allergy    SEASONAL  . Anemia   . Anxiety   . Atrophic vaginitis   . Cervical cancer screening    PAP normal 2017: GYN to repeat 2020.  Marland Kitchen Chronic headaches    tension, mirgraines  . Depression   . Diverticulosis   . Duodenal ulcer 1989  . Dysplasia of cervix, low grade (CIN 1)    S/P LEEP  . Endometrial hyperplasia    SIMPLE  . GERD (gastroesophageal reflux disease)   . 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  Much improved at Dr. Michaelle Copas f/u 10/2015.  Marland Kitchen Hiatal hernia   . Hyperlipidemia    LDL goal = < 140.  No meds as of 04/2016.  . Medial meniscus tear 04/2014   R knee  . Osteoarthritis    KNEES  . Osteoporosis    GYN managing; as of  01/2016 pt is on Evista.  She is intol of oral bisphosph due to GERD.  . Other and unspecified ovarian cysts   . Prediabetes 2018   A1c 6% 04/2016  . Rib fracture 09/04/2017   Mildly displaced R 6th rib fx    Past Surgical History:  Procedure Laterality Date  . ABDOMINAL SURGERY  2003    EXPLORATORY LAPAROTOMY, LSO  . ADENOIDECTOMY  1954  . CARPAL TUNNEL RELEASE Bilateral 2001  . CHOLECYSTECTOMY  1989   stones  . COLONOSCOPY  2011; 03/2013   2011 NORMAL.  03/2013 TCS done for heme+ stool: no polyps.  Repeat 10 yrs (after 03/2023).  Marland Kitchen DEXA  03/23/2014   Osteopenia.  Pt took prolia x 2 injections, then was switched to evista by her GYN.  DEXA 01/17/16 T score a bit worse, same regimen rec'd, repeat DEXA 1 yr per GYN.  Marland Kitchen ENDOMETRIAL BIOPSY  09/27/2015   PATH:  BENIGN  . endoscopy upper GI  2000; 05/2014   negative 2000.  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 Russella Dar is PPI bid long term.  Marland Kitchen HYSTEROSCOPY     UTERINE POLYPS  . MOUTH SURGERY    .  OOPHORECTOMY  2003   LSO WITH EXPLORATORY LAPAROTOMY  . TUBAL LIGATION  1987    Outpatient Medications Prior to Visit  Medication Sig Dispense Refill  . Calcium Carbonate-Vitamin D 600-400 MG-UNIT per tablet Take 1 tablet by mouth 2 (two) times daily.      . clorazepate (TRANXENE) 7.5 MG tablet Take 7.5 mg by mouth once as needed.      . Magnesium 500 MG CAPS Take 1 capsule by mouth daily.    . Multiple Vitamin (MULTIVITAMIN) tablet Take 1 tablet by mouth daily.      Marland Kitchen omeprazole (PRILOSEC) 40 MG capsule TAKE 1 CAPSULE BY MOUTH 2 TIMES A DAY 180 capsule 0  . QUEtiapine (SEROQUEL XR) 400 MG 24 hr tablet Take 1.5 tablets by mouth at bedtime.    . raloxifene (EVISTA) 60 MG tablet Take 1 tablet (60 mg total) by mouth daily. 90 tablet 3  . sertraline (ZOLOFT) 100 MG tablet Take 2 tablets by mouth daily.  4  . TURMERIC PO Take 1 capsule by mouth daily.    Marland Kitchen  HYDROcodone-acetaminophen (NORCO/VICODIN) 5-325 MG tablet 1-2 tabs tid prn severe pain (Patient not taking: Reported on 10/17/2017) 42 tablet 0   No facility-administered medications prior to visit.     No Known Allergies  ROS As per HPI  PE: Blood pressure 119/72, pulse 92, temperature 98.4 F (36.9 C), temperature source Oral, resp. rate 16, height 5' 1.25" (1.556 m), weight 188 lb 2 oz (85.3 kg), SpO2 93 %. Gen: Alert, well appearing.  Patient is oriented to person, place, time, and situation. AFFECT: pleasant, lucid thought and speech. ZOX:WRUE: no injection, icteris, swelling, or exudate.  EOMI, PERRLA. Palpebral conjunctivae with mild pallor Mouth: lips without lesion/swelling.  Oral mucosa pink and moist. Oropharynx without erythema, exudate, or swelling.  CV: RRR, no m/r/g.   LUNGS: CTA bilat, nonlabored resps, good aeration in all lung fields. EXT: no clubbing or cyanosis.  no edema.    LABS:  Lab Results  Component Value Date   TSH 2.32 04/17/2016   Lab Results  Component Value Date   WBC 5.8 10/17/2017   HGB 11.4 (L) 10/17/2017   HCT 35.5 (L) 10/17/2017   MCV 81.5 10/17/2017   PLT 182.0 10/17/2017   Lab Results  Component Value Date   IRON 58 10/17/2017   FERRITIN 14.4 10/17/2017   Lab Results  Component Value Date   VITAMINB12 431 09/29/2008    Lab Results  Component Value Date   CREATININE 1.01 04/17/2016   BUN 14 04/17/2016   NA 144 04/17/2016   K 4.4 04/17/2016   CL 107 04/17/2016   CO2 30 04/17/2016   Lab Results  Component Value Date   ALT 19 04/17/2016   AST 23 04/17/2016   ALKPHOS 91 04/17/2016   BILITOT 0.3 04/17/2016   Lab Results  Component Value Date   CHOL 211 (H) 04/17/2016   Lab Results  Component Value Date   HDL 74.40 04/17/2016   Lab Results  Component Value Date   LDLCALC 113 (H) 04/17/2016   Lab Results  Component Value Date   TRIG 116.0 04/17/2016   Lab Results  Component Value Date   CHOLHDL 3 04/17/2016    Lab Results  Component Value Date   HGBA1C 5.6 01/31/2017    IMPRESSION AND PLAN:  Normocytic anemia. Check CBC today as well as iron panel, vit B12, and folate. Also needs to do home hemoccults x 3. Stop excedrin migraine use---take 1000  mg tylenol q6h prn HA. Her HAs sound like mostly tension HA's but possibly some migraines mixed in.  If tylenol no help and we need to continue avoiding NSAIDs then may consider trial of triptan.  She'll continue magOH 500mg  and I recommended adding 100mg  vit B2 to this for HA prevention.  An After Visit Summary was printed and given to the patient.  FOLLOW UP: Return in about 6 weeks (around 11/28/2017) for f/u anemia.  Signed:  Santiago Bumpers, MD           10/17/2017

## 2017-10-18 LAB — IRON AND TIBC
IRON SATURATION: 12 % — AB (ref 15–55)
Iron: 54 ug/dL (ref 27–139)
TIBC: 439 ug/dL (ref 250–450)
UIBC: 385 ug/dL — AB (ref 118–369)

## 2017-10-21 ENCOUNTER — Encounter: Payer: Self-pay | Admitting: Family Medicine

## 2017-10-21 ENCOUNTER — Ambulatory Visit: Payer: Medicare Other

## 2017-10-23 ENCOUNTER — Encounter: Payer: Self-pay | Admitting: Family Medicine

## 2017-10-23 ENCOUNTER — Other Ambulatory Visit: Payer: Medicare Other

## 2017-10-23 DIAGNOSIS — D649 Anemia, unspecified: Secondary | ICD-10-CM

## 2017-10-23 LAB — HEMOCCULT SLIDES (X 3 CARDS)
FECAL OCCULT BLD: NEGATIVE
OCCULT 1: NEGATIVE
OCCULT 2: NEGATIVE
OCCULT 3: NEGATIVE
OCCULT 4: NEGATIVE
OCCULT 5: NEGATIVE

## 2017-11-14 ENCOUNTER — Other Ambulatory Visit (INDEPENDENT_AMBULATORY_CARE_PROVIDER_SITE_OTHER): Payer: Medicare Other

## 2017-11-14 ENCOUNTER — Ambulatory Visit: Payer: Medicare Other | Admitting: Gastroenterology

## 2017-11-14 ENCOUNTER — Encounter: Payer: Self-pay | Admitting: Gastroenterology

## 2017-11-14 VITALS — BP 110/64 | HR 80 | Ht 61.25 in | Wt 188.0 lb

## 2017-11-14 DIAGNOSIS — K219 Gastro-esophageal reflux disease without esophagitis: Secondary | ICD-10-CM

## 2017-11-14 DIAGNOSIS — D509 Iron deficiency anemia, unspecified: Secondary | ICD-10-CM

## 2017-11-14 LAB — IGA: IGA: 145 mg/dL (ref 68–378)

## 2017-11-14 MED ORDER — NA SULFATE-K SULFATE-MG SULF 17.5-3.13-1.6 GM/177ML PO SOLN: 1.0000 | Freq: Once | ORAL | 0 refills | Status: AC

## 2017-11-14 NOTE — Patient Instructions (Addendum)
Your provider has requested that you go to the basement level for lab work before leaving today. Press "B" on the elevator. The lab is located at the first door on the left as you exit the elevator.  HOLD IRON 5 DAYS PRIOR TO YOUR PROCEDURE  You have been scheduled for an endoscopy and colonoscopy. Please follow the written instructions given to you at your visit today. Please pick up your prep supplies at the pharmacy within the next 1-3 days. If you use inhalers (even only as needed), please bring them with you on the day of your procedure. Your physician has requested that you go to www.[email or web address].com and enter the access code given to you at your visit today. This web site gives a general overview about your procedure. However, you should still follow specific instructions given to you by our office regarding your preparation for the procedure.  Thank you for choosing me and [facility].  Venita Lick. Pleas Koch., MD., Clementeen Graham

## 2017-11-14 NOTE — Progress Notes (Signed)
History of Present Illness: This is 67 year old female with recently diagnosed iron deficiency anemia with hemoccult negative stool.  She has a history of GERD that is well controlled on omeprazole bid.  EGD in April 2016 showed duodenal stenosis, a small hiatal hernia and an irregular Z line with biopsies that did not show Barrett's.  Colonoscopy in February 2015 showed diverticulosis and external hemorrhoids. Denies weight loss, abdominal pain, constipation, diarrhea, change in stool caliber, melena, hematochezia, nausea, vomiting, dysphagia, chest pain.     No Known Allergies Outpatient Medications Prior to Visit  Medication Sig Dispense Refill  . Calcium Carbonate-Vitamin D 600-400 MG-UNIT per tablet Take 1 tablet by mouth 2 (two) times daily.      . clorazepate (TRANXENE) 7.5 MG tablet Take 7.5 mg by mouth once as needed.      Marland Kitchen HYDROcodone-acetaminophen (NORCO/VICODIN) 5-325 MG tablet 1-2 tabs tid prn severe pain 42 tablet 0  . Magnesium 500 MG CAPS Take 1 capsule by mouth daily.    . Multiple Vitamin (MULTIVITAMIN) tablet Take 1 tablet by mouth daily.      Marland Kitchen omeprazole (PRILOSEC) 40 MG capsule TAKE 1 CAPSULE BY MOUTH 2 TIMES A DAY 180 capsule 0  . QUEtiapine (SEROQUEL XR) 400 MG 24 hr tablet Take 1.5 tablets by mouth at bedtime.    . raloxifene (EVISTA) 60 MG tablet Take 1 tablet (60 mg total) by mouth daily. 90 tablet 3  . sertraline (ZOLOFT) 100 MG tablet Take 2 tablets by mouth daily.  4  . TURMERIC PO Take 1 capsule by mouth daily.     No facility-administered medications prior to visit.    Past Medical History:  Diagnosis Date  . Acute parotitis 11/2015   hx of:  sialolithiasis suspected by ENT--spont resolved.  . Allergy    SEASONAL  . Anxiety   . Atrophic vaginitis   . Cervical cancer screening    PAP normal 2017: GYN to repeat 2020.  Marland Kitchen Chronic headaches    tension, mirgraines  . Depression   . Diverticulosis   . Duodenal ulcer 1989  . Dysplasia of cervix, low  grade (CIN 1)    S/P LEEP  . Endometrial hyperplasia    SIMPLE  . GERD (gastroesophageal reflux disease)   . 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  Much improved at Dr. Michaelle Copas f/u 10/2015.  Marland Kitchen Hiatal hernia   . Hyperlipidemia    LDL goal = < 140.  No meds as of 04/2016.  . Iron deficiency anemia 09/2017   Started iron 10/18/17.  Hemoccults neg 10/22/17.  . Medial meniscus tear 04/2014   R knee  . Osteoarthritis    KNEES  . Osteoporosis    GYN managing; as of 01/2016 pt is on Evista.  She is intol of oral bisphosph due to GERD.  . Other and unspecified ovarian cysts   . Prediabetes 2018   A1c 6% 04/2016  . Rib fracture 09/04/2017   Mildly displaced R 6th rib fx   Past Surgical History:  Procedure Laterality Date  . ABDOMINAL SURGERY  2003    EXPLORATORY LAPAROTOMY, LSO  . ADENOIDECTOMY  1954  . CARPAL TUNNEL RELEASE Bilateral 2001  . CHOLECYSTECTOMY  1989   stones  . COLONOSCOPY  2011; 03/2013   2011 NORMAL.  03/2013 TCS done for heme+ stool: no polyps.  Repeat 10 yrs (after 03/2023).  Marland Kitchen DEXA  03/23/2014   Osteopenia.  Pt took prolia x 2 injections, then was switched  to evista by her GYN.  DEXA 01/17/16 T score a bit worse, same regimen rec'd, repeat DEXA 1 yr per GYN.  Marland Kitchen ENDOMETRIAL BIOPSY  09/27/2015   PATH:  BENIGN  . endoscopy upper GI  2000; 05/2014   negative 2000.  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 Russella Dar is PPI bid long term.  Marland Kitchen HYSTEROSCOPY     UTERINE POLYPS  . MOUTH SURGERY    . OOPHORECTOMY  2003   LSO WITH EXPLORATORY LAPAROTOMY  . TUBAL LIGATION  1987   Social History   Socioeconomic History  . Marital status: Married    Spouse name: Not on file  . Number of children: 1  . Years of education: Not on file  . Highest education level: Not on file  Occupational History  . 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 Financial resource strain: Not on file    . Food insecurity:    Worry: Not on file    Inability: Not on file  . Transportation needs:    Medical: Not on file    Non-medical: Not on file  Tobacco Use  . Smoking status: Never Smoker  . Smokeless tobacco: Never Used  Substance and Sexual Activity  . Alcohol use: Yes    Alcohol/week: 1.0 standard drinks    Types: 1 Glasses of wine per week    Comment: Rarely  . Drug use: No  . Sexual activity: Yes    Birth control/protection: Surgical  Lifestyle  . Physical activity:    Days per week: Not on file    Minutes per session: Not on file  . Stress: Not on file  Relationships  . Social connections:    Talks on phone: Not on file    Gets together: Not on file    Attends religious service: Not on file    Active member of club or organization: Not on file    Attends meetings of clubs or organizations: Not on file    Relationship status: Not on file  Other Topics Concern  . Not on file  Social History Narrative   Married, one son.   Orig from [place] area, lives in [place].   Occupation: retired from [employer]).   No tob, rare, alcohol, no drugs.   Exercise: stationary bike 6 days a week.  [facility]   Family History  Problem Relation Age of Onset  . Heart attack Father        abnormal EKG; ? AVR  . Diabetes Father   . Heart disease Father   . Hyperlipidemia Mother   . Anxiety disorder Mother   . Depression Mother   . Irregular heart beat Sister   . Heart disease Maternal Grandfather   . Heart failure Maternal Grandfather   . Osteoporosis Maternal Grandmother   . Colon cancer Neg Hx          Physical Exam: General: Well developed, well nourished, no acute distress Head: Normocephalic and atraumatic Eyes:  sclerae anicteric, EOMI Ears: Normal auditory acuity Mouth: No deformity or lesions Lungs: Clear throughout to auscultation Heart: Regular rate and rhythm; no murmurs, rubs or bruits Abdomen: Soft, non tender  and non distended. No masses, hepatosplenomegaly or hernias noted. Normal Bowel sounds Rectal: Deferred to colonoscopy Musculoskeletal: Symmetrical with no gross deformities  Pulses:  Normal pulses  noted Extremities: No clubbing, cyanosis, edema or deformities noted Neurological: Alert oriented x 4, grossly nonfocal Psychological:  Alert and cooperative. Normal mood and affect   Assessment and Recommendations:  1.  Iron deficiency anemia, Hemoccult negative stool.  Rule out celiac disease, AVMs, ulcer, etc. IgA, tTG today.  Continue iron once daily.  Hold iron for 5 days prior to colonoscopy.  Schedule colonoscopy and EGD. The risks (including bleeding, perforation, infection, missed lesions, medication reactions and possible hospitalization or surgery if complications occur), benefits, and alternatives to colonoscopy with possible biopsy and possible polypectomy were discussed with the patient and they consent to proceed. The risks (including bleeding, perforation, infection, missed lesions, medication reactions and possible hospitalization or surgery if complications occur), benefits, and alternatives to endoscopy with possible biopsy and possible dilation were discussed with the patient and they consent to proceed. Consider VCE if no source noted on colonoscopy or EGD however will need to reassess duodenal stenosis.

## 2017-11-15 LAB — TISSUE TRANSGLUTAMINASE, IGA: (TTG) AB, IGA: 1 U/mL

## 2017-11-29 ENCOUNTER — Encounter: Payer: Self-pay | Admitting: Family Medicine

## 2017-11-29 ENCOUNTER — Ambulatory Visit: Payer: Medicare Other | Admitting: Family Medicine

## 2017-11-29 VITALS — BP 124/62 | HR 91 | Resp 16 | Ht 61.25 in | Wt 186.0 lb

## 2017-11-29 DIAGNOSIS — D508 Other iron deficiency anemias: Secondary | ICD-10-CM

## 2017-11-29 NOTE — Progress Notes (Signed)
OFFICE VISIT  11/29/2017   CC:  Chief Complaint  Patient presents with  . Follow-up    anemia   HPI:    Patient is a 67 y.o.  female who presents for 6 week f/u iron def anemia. She saw GI MD on 11/14/17: plan was to rule out celiac disease, AVMs, ulcer, etc.  IgA, tTG done: neg.  Continued iron once daily.  Schedule colonoscopy and EGD. Considering VCE if no source found, but will need to reassess duodenal stenosis.  No new sx's since I last saw her. She has been on iron supplement about 6 wks.  Most recent Hb 2 wks ago showed some rise in Hb.  She has EGD/colonoscopy scheduled for 12/19/17. Tolerating a daily iron pill.  Energy level is improving.  Some darker colored stools since getting on iron, but no melena.  No BRBPR.  Past Medical History:  Diagnosis Date  . Acute parotitis 11/2015   hx of:  sialolithiasis suspected by ENT--spont resolved.  . Allergy    SEASONAL  . Anxiety   . Atrophic vaginitis   . Cervical cancer screening    PAP normal 2017: GYN to repeat 2020.  Marland Kitchen Chronic headaches    tension, mirgraines  . Depression   . Diverticulosis   . Duodenal ulcer 1989  . Dysplasia of cervix, low grade (CIN 1)    S/P LEEP  . Endometrial hyperplasia    SIMPLE  . GERD (gastroesophageal reflux disease)   . 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  Much improved at Dr. Michaelle Copas f/u 10/2015.  Marland Kitchen Hiatal hernia   . Hyperlipidemia    LDL goal = < 140.  No meds as of 04/2016.  . Iron deficiency anemia 09/2017   Started iron 10/18/17.  Hemoccults neg 10/22/17.  Dr. Russella Dar saw her 11/14/17 and plans to do EGD/Colon + check celiac labs.  . Medial meniscus tear 04/2014   R knee  . Osteoarthritis    KNEES  . Osteoporosis    GYN managing; as of 01/2016 pt is on Evista.  She is intol of oral bisphosph due to GERD.  . Other and unspecified ovarian cysts   . Prediabetes 2018   A1c 6% 04/2016  . Rib fracture 09/04/2017   Mildly displaced R 6th rib fx    Past Surgical History:   Procedure Laterality Date  . ABDOMINAL SURGERY  2003    EXPLORATORY LAPAROTOMY, LSO  . ADENOIDECTOMY  1954  . CARPAL TUNNEL RELEASE Bilateral 2001  . CHOLECYSTECTOMY  1989   stones  . COLONOSCOPY  2011; 03/2013   2011 NORMAL.  03/2013 TCS done for heme+ stool: no polyps.  Repeat 10 yrs (after 03/2023).  Marland Kitchen DEXA  03/23/2014   Osteopenia.  Pt took prolia x 2 injections, then was switched to evista by her GYN.  DEXA 01/17/16 T score a bit worse, same regimen rec'd, repeat DEXA 1 yr per GYN.  Marland Kitchen ENDOMETRIAL BIOPSY  09/27/2015   PATH:  BENIGN  . endoscopy upper GI  2000; 05/2014   negative 2000.  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 Russella Dar is PPI bid long term.  Marland Kitchen HYSTEROSCOPY     UTERINE POLYPS  . MOUTH SURGERY    . OOPHORECTOMY  2003   LSO WITH EXPLORATORY LAPAROTOMY  . TUBAL LIGATION  1987    Outpatient Medications Prior to Visit  Medication Sig Dispense Refill  . Calcium  Carbonate-Vitamin D 600-400 MG-UNIT per tablet Take 1 tablet by mouth 2 (two) times daily.      . clorazepate (TRANXENE) 7.5 MG tablet Take 7.5 mg by mouth once as needed.      . ferrous sulfate 325 (65 FE) MG tablet Take 325 mg by mouth daily with breakfast.    . Magnesium 500 MG CAPS Take 1 capsule by mouth daily.    . Multiple Vitamin (MULTIVITAMIN) tablet Take 1 tablet by mouth daily.      Marland Kitchen omeprazole (PRILOSEC) 40 MG capsule TAKE 1 CAPSULE BY MOUTH 2 TIMES A DAY 180 capsule 0  . QUEtiapine (SEROQUEL XR) 400 MG 24 hr tablet Take 1.5 tablets by mouth at bedtime.    . raloxifene (EVISTA) 60 MG tablet Take 1 tablet (60 mg total) by mouth daily. 90 tablet 3  . sertraline (ZOLOFT) 100 MG tablet Take 2 tablets by mouth daily.  4  . TURMERIC PO Take 1 capsule by mouth daily.    Marland Kitchen HYDROcodone-acetaminophen (NORCO/VICODIN) 5-325 MG tablet 1-2 tabs tid prn severe pain (Patient not taking: Reported on 11/29/2017) 42 tablet 0   No  facility-administered medications prior to visit.     No Known Allergies  ROS As per HPI  PE: Blood pressure 124/62, pulse 91, resp. rate 16, height 5' 1.25" (1.556 m), weight 186 lb (84.4 kg), SpO2 96 %.   Gen: Alert, well appearing.  Patient is oriented to person, place, time, and situation. AFFECT: pleasant, lucid thought and speech. CV: RRR, no m/r/g.   LUNGS: CTA bilat, nonlabored resps, good aeration in all lung fields. No pallor or jaundice.  LABS:    Chemistry      Component Value Date/Time   NA 141 10/08/2017   K 4.5 10/08/2017   CL 107 04/17/2016 0920   CO2 30 04/17/2016 0920   BUN 19 10/08/2017   CREATININE 19.0 (A) 10/08/2017   CREATININE 1.01 04/17/2016 0920   CREATININE 0.84 03/10/2013 1128   GLU 94 10/08/2017      Component Value Date/Time   CALCIUM 9.5 04/17/2016 0920   ALKPHOS 96 10/08/2017   AST 21 10/08/2017   ALT 18 10/08/2017   BILITOT 0.3 04/17/2016 0920     Lab Results  Component Value Date   WBC 5.8 10/17/2017   HGB 11.4 (L) 10/17/2017   HCT 35.5 (L) 10/17/2017   MCV 81.5 10/17/2017   PLT 182.0 10/17/2017   Lab Results  Component Value Date   IRON 54 10/17/2017   TIBC 439 10/17/2017   FERRITIN 14.4 10/17/2017   Lab Results  Component Value Date   VITAMINB12 431 09/29/2008    IMPRESSION AND PLAN:  Iron def anemia; GI blood loss until proven otherwise. Hemoccults have been neg. Hb rose some on short period of iron (10.6 to 11.4). Pt feeling more energy. Plan: continue daily iron tab, she is set for upper and lower endoscopies 12/19/17 with Dr. Russella Dar. We'll have her recheck CBC with iron panel in 1 mo here.  An After Visit Summary was printed and given to the patient.  FOLLOW UP: Return for 4 wk lab visit (labs ordered).  F/u with me in 5 mo for CPE.  Signed:  Santiago Bumpers, MD           11/29/2017

## 2017-12-02 ENCOUNTER — Ambulatory Visit: Payer: Medicare Other

## 2017-12-06 ENCOUNTER — Other Ambulatory Visit: Payer: Self-pay | Admitting: Family Medicine

## 2017-12-06 NOTE — Telephone Encounter (Signed)
The sertraline and quetiapine are rx'd by her psychiatrist, Dr. Sherle Poe

## 2017-12-06 NOTE — Telephone Encounter (Signed)
Received RF request from [facility], [place] to fill patient's Quetiapine ER 400mg  tablet # 180 x 3 refills.  I can't tell where this has been RX'd by you in the past.  Can you please advise?    Thanks.

## 2017-12-10 ENCOUNTER — Other Ambulatory Visit: Payer: Self-pay | Admitting: *Deleted

## 2017-12-10 NOTE — Telephone Encounter (Signed)
This medication has not been Rx'ed by Dr. Milinda Cave in the past.   Please advise. Thanks.

## 2017-12-11 NOTE — Telephone Encounter (Signed)
This RF request needs to go to Dr. Evelene Croon, pt's psychiatrist.-thx

## 2017-12-16 ENCOUNTER — Ambulatory Visit: Payer: Medicare Other | Admitting: Family Medicine

## 2017-12-16 ENCOUNTER — Encounter: Payer: Self-pay | Admitting: Family Medicine

## 2017-12-16 VITALS — BP 124/73 | HR 98 | Temp 98.3°F | Resp 16 | Ht 61.25 in | Wt 187.4 lb

## 2017-12-16 DIAGNOSIS — H6501 Acute serous otitis media, right ear: Secondary | ICD-10-CM | POA: Diagnosis not present

## 2017-12-16 DIAGNOSIS — H60391 Other infective otitis externa, right ear: Secondary | ICD-10-CM | POA: Diagnosis not present

## 2017-12-16 MED ORDER — NEOMYCIN-POLYMYXIN-HC 3.5-10000-1 OT SOLN: OTIC | 0 refills | Status: DC

## 2017-12-16 MED ORDER — AMOXICILLIN 875 MG PO TABS: 875.0000 mg | ORAL_TABLET | Freq: Two times a day (BID) | ORAL | 0 refills | Status: AC

## 2017-12-16 NOTE — Progress Notes (Signed)
OFFICE VISIT  12/20/2017   CC:  Chief Complaint  Patient presents with  . Ear Problem    right ear popping   HPI:    Patient is a 67 y.o. Caucasian female who presents for "ear popping" , right side. A few days ago started hearing a little "crackling noise" in R ear, feels it "pop" sometimes, a little pain sometimes. A little itching as well at times.  No ear drainage.  No hearing deficit noted in either ear. No nasal congestion/runny nose.   No sneezing or PND.  No itchy/runny eyes.  Past Medical History:  Diagnosis Date  . Acute parotitis 11/2015   hx of:  sialolithiasis suspected by ENT--spont resolved.  . Allergy    SEASONAL  . Anxiety   . Atrophic vaginitis   . Cervical cancer screening    PAP normal 2017: GYN to repeat 2020.  Marland Kitchen Chronic headaches    tension, mirgraines  . Depression   . Diverticulosis   . Duodenal ulcer 1989  . Dysplasia of cervix, low grade (CIN 1)    S/P LEEP  . Endometrial hyperplasia    SIMPLE  . GERD (gastroesophageal reflux disease)   . 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  Much improved at Dr. Michaelle Copas f/u 10/2015.  Marland Kitchen Hiatal hernia   . Hyperlipidemia    LDL goal = < 140.  No meds as of 04/2016.  . Iron deficiency anemia 09/2017   Started iron 10/18/17.  Hemoccults neg 10/22/17.  Dr. Russella Dar saw her 11/14/17 and plans to do EGD/Colon + check celiac labs.  . Iron deficiency anemia Fall 2019   Celiac screen NEG, hemoccults neg.  Pt to get EGD and TCS 12/19/17.  . Medial meniscus tear 04/2014   R knee  . Osteoarthritis    KNEES  . Osteoporosis    GYN managing; as of 01/2016 pt is on Evista.  She is intol of oral bisphosph due to GERD.  . Other and unspecified ovarian cysts   . Prediabetes 2018   A1c 6% 04/2016  . Rib fracture 09/04/2017   Mildly displaced R 6th rib fx    Past Surgical History:  Procedure Laterality Date  . ABDOMINAL SURGERY  2003    EXPLORATORY LAPAROTOMY, LSO  . ADENOIDECTOMY  1954  . CARPAL TUNNEL RELEASE  Bilateral 2001  . CHOLECYSTECTOMY  1989   stones  . COLONOSCOPY  2011; 03/2013   2011 NORMAL.  03/2013 TCS done for heme+ stool: no polyps.  Repeat 10 yrs (after 03/2023).  Marland Kitchen DEXA  03/23/2014   Osteopenia.  Pt took prolia x 2 injections, then was switched to evista by her GYN.  DEXA 01/17/16 T score a bit worse, same regimen rec'd, repeat DEXA 1 yr per GYN.  Marland Kitchen ENDOMETRIAL BIOPSY  09/27/2015   PATH:  BENIGN  . endoscopy upper GI  2000; 05/2014   negative 2000.  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 Russella Dar is PPI bid long term.  Marland Kitchen HYSTEROSCOPY     UTERINE POLYPS  . MOUTH SURGERY    . OOPHORECTOMY  2003   LSO WITH EXPLORATORY LAPAROTOMY  . TUBAL LIGATION  1987    Outpatient Medications Prior to Visit  Medication Sig Dispense Refill  . Calcium Carbonate-Vitamin D 600-400 MG-UNIT per tablet Take 1 tablet by mouth 2 (two) times daily.      . clorazepate (TRANXENE) 7.5 MG tablet Take 7.5  mg by mouth once as needed.      . ferrous sulfate 325 (65 FE) MG tablet Take 325 mg by mouth daily with breakfast.    . Magnesium 500 MG CAPS Take 1 capsule by mouth daily.    . Multiple Vitamin (MULTIVITAMIN) tablet Take 1 tablet by mouth daily.      Marland Kitchen omeprazole (PRILOSEC) 40 MG capsule TAKE 1 CAPSULE BY MOUTH 2 TIMES A DAY 180 capsule 0  . QUEtiapine (SEROQUEL XR) 400 MG 24 hr tablet Take 1.5 tablets by mouth at bedtime.    . raloxifene (EVISTA) 60 MG tablet Take 1 tablet (60 mg total) by mouth daily. 90 tablet 3  . sertraline (ZOLOFT) 100 MG tablet Take 2 tablets by mouth daily.  4  . TURMERIC PO Take 1 capsule by mouth daily.    . Vitamins-Lipotropics (LIPO-FLAVONOID PLUS PO) Take 1 capsule by mouth 2 (two) times daily.    Marland Kitchen HYDROcodone-acetaminophen (NORCO/VICODIN) 5-325 MG tablet 1-2 tabs tid prn severe pain (Patient not taking: Reported on 11/29/2017) 42 tablet 0   No facility-administered medications prior to visit.     No  Known Allergies  ROS As per HPI  PE: Blood pressure 124/73, pulse 98, temperature 98.3 F (36.8 C), temperature source Oral, resp. rate 16, height 5' 1.25" (1.556 m), weight 187 lb 6 oz (85 kg), SpO2 95 %. Gen: Alert, well appearing.  Patient is oriented to person, place, time, and situation. AFFECT: pleasant, lucid thought and speech. L EAR EAC clear/normal epithelium, TM with good light reflex and landmarks. R EAR EAC with some cerumen and question of mild erythema of epithelium diffusely. TM dull/opaque appearance, with some air bubbles noted in middle ear.  No pus, no significant erythema of TM.  TM is intact.  Central portion of TM appeared a bit darker/shadowed compared to the rest of the TM.  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 Will do cortisporin otic drops and amoxil x 7d and see her back in 2 wks to recheck (odd appearance of central portion of R TM--cholesteatoma?).  An After Visit Summary was printed and given to the patient.  FOLLOW UP: Return for keep appt already set with me for 12/27/17.  Signed:  Santiago Bumpers, MD           12/20/2017

## 2017-12-19 ENCOUNTER — Ambulatory Visit: Payer: Medicare Other | Admitting: Family Medicine

## 2017-12-19 ENCOUNTER — Encounter: Payer: Self-pay | Admitting: Family Medicine

## 2017-12-19 ENCOUNTER — Ambulatory Visit: Payer: Self-pay

## 2017-12-19 ENCOUNTER — Encounter: Payer: Medicare Other | Admitting: Gastroenterology

## 2017-12-19 ENCOUNTER — Ambulatory Visit (INDEPENDENT_AMBULATORY_CARE_PROVIDER_SITE_OTHER)
Admission: RE | Admit: 2017-12-19 | Discharge: 2017-12-19 | Disposition: A | Discharge status: Home / Self Care | Payer: Medicare Other | Source: Ambulatory Visit | Attending: Family Medicine | Admitting: Family Medicine

## 2017-12-19 ENCOUNTER — Other Ambulatory Visit: Payer: Self-pay

## 2017-12-19 ENCOUNTER — Telehealth: Payer: Self-pay | Admitting: Gastroenterology

## 2017-12-19 VITALS — BP 126/68 | HR 103 | Ht 61.0 in | Wt 184.0 lb

## 2017-12-19 DIAGNOSIS — S42292A Other displaced fracture of upper end of left humerus, initial encounter for closed fracture: Secondary | ICD-10-CM | POA: Diagnosis not present

## 2017-12-19 DIAGNOSIS — S42209A Unspecified fracture of upper end of unspecified humerus, initial encounter for closed fracture: Secondary | ICD-10-CM

## 2017-12-19 DIAGNOSIS — S42309A Unspecified fracture of shaft of humerus, unspecified arm, initial encounter for closed fracture: Secondary | ICD-10-CM

## 2017-12-19 DIAGNOSIS — M79602 Pain in left arm: Secondary | ICD-10-CM

## 2017-12-19 HISTORY — DX: Unspecified fracture of upper end of unspecified humerus, initial encounter for closed fracture: S42.209A

## 2017-12-19 HISTORY — DX: Unspecified fracture of shaft of humerus, unspecified arm, initial encounter for closed fracture: S42.309A

## 2017-12-19 MED ORDER — OXYCODONE-ACETAMINOPHEN 5-325 MG PO TABS: 1.0000 | ORAL_TABLET | Freq: Four times a day (QID) | ORAL | 0 refills | Status: AC | PRN

## 2017-12-19 NOTE — Progress Notes (Signed)
[facility] [address] [place], [place] [address] Phone: [phone] Subjective:     CC: Left arm and shoulder pain  [id number]  Suzanne [facility] is a 67 y.o. female coming in with complaint of left arm pain. She fell on her left shoulder last night when she was trying to make it to the bathroom before her colonoscopy. She does not recall a pop or snap with the fall. Is having pain from shoulder into the humerus and elbow. Denies any radiating symptoms.   Patient has noticed some bruising.  States that the pain is 9 out of 10.  No numbness or tingling in the fingers.  Does have good grip strength she still states.  States that the pain is unrelenting.  Recent fall 3 months ago with a broken rib and states that this is significantly worse.   X-rays of the shoulder were independently visualized by me.  Patient does have a mildly displaced comminuted fracture of the proximal humerus.  Seems to be in the anatomical neck  Past Medical History:  Diagnosis Date  . Acute parotitis 11/2015   hx of:  sialolithiasis suspected by ENT--spont resolved.  . Allergy    SEASONAL  . Anxiety   . Atrophic vaginitis   . Cervical cancer screening    PAP normal 2017: GYN to repeat 2020.  Marland Kitchen Chronic headaches    tension, mirgraines  . Depression   . Diverticulosis   . Duodenal ulcer 1989  . Dysplasia of cervix, low grade (CIN 1)    S/P LEEP  . Endometrial hyperplasia    SIMPLE  . GERD (gastroesophageal reflux disease)   . 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  Much improved at Dr. Michaelle Copas f/u 10/2015.  Marland Kitchen Hiatal hernia   . Hyperlipidemia    LDL goal = < 140.  No meds as of 04/2016.  . Iron deficiency anemia 09/2017   Started iron 10/18/17.  Hemoccults neg 10/22/17.  Dr. Russella Dar saw her 11/14/17 and plans to do EGD/Colon + check celiac labs.  . Iron deficiency anemia Fall 2019   Celiac screen NEG, hemoccults neg.  Pt to get EGD and TCS 12/19/17.  . Medial meniscus  tear 04/2014   R knee  . Osteoarthritis    KNEES  . Osteoporosis    GYN managing; as of 01/2016 pt is on Evista.  She is intol of oral bisphosph due to GERD.  . Other and unspecified ovarian cysts   . Prediabetes 2018   A1c 6% 04/2016  . Rib fracture 09/04/2017   Mildly displaced R 6th rib fx   Past Surgical History:  Procedure Laterality Date  . ABDOMINAL SURGERY  2003    EXPLORATORY LAPAROTOMY, LSO  . ADENOIDECTOMY  1954  . CARPAL TUNNEL RELEASE Bilateral 2001  . CHOLECYSTECTOMY  1989   stones  . COLONOSCOPY  2011; 03/2013   2011 NORMAL.  03/2013 TCS done for heme+ stool: no polyps.  Repeat 10 yrs (after 03/2023).  Marland Kitchen DEXA  03/23/2014   Osteopenia.  Pt took prolia x 2 injections, then was switched to evista by her GYN.  DEXA 01/17/16 T score a bit worse, same regimen rec'd, repeat DEXA 1 yr per GYN.  Marland Kitchen ENDOMETRIAL BIOPSY  09/27/2015   PATH:  BENIGN  . endoscopy upper GI  2000; 05/2014   negative 2000.  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 Russella Dar  is PPI bid long term.  Marland Kitchen HYSTEROSCOPY     UTERINE POLYPS  . MOUTH SURGERY    . OOPHORECTOMY  2003   LSO WITH EXPLORATORY LAPAROTOMY  . TUBAL LIGATION  1987   Social History   Socioeconomic History  . Marital status: Married    Spouse name: Not on file  . Number of children: 1  . Years of education: Not on file  . Highest education level: Not on file  Occupational History  . 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 Financial resource strain: Not on file  . Food insecurity:    Worry: Not on file    Inability: Not on file  . Transportation needs:    Medical: Not on file    Non-medical: Not on file  Tobacco Use  . Smoking status: Never Smoker  . Smokeless tobacco: Never Used  Substance and Sexual Activity  . Alcohol use: Yes    Alcohol/week: 1.0 standard drinks    Types: 1 Glasses of wine per week    Comment: Rarely  . Drug  use: No  . Sexual activity: Yes    Birth control/protection: Surgical  Lifestyle  . Physical activity:    Days per week: Not on file    Minutes per session: Not on file  . Stress: Not on file  Relationships  . Social connections:    Talks on phone: Not on file    Gets together: Not on file    Attends religious service: Not on file    Active member of club or organization: Not on file    Attends meetings of clubs or organizations: Not on file    Relationship status: Not on file  Other Topics Concern  . Not on file  Social History Narrative   Married, one son.   Orig from [place] area, lives in [place].   Occupation: retired from [employer]).   No tob, rare, alcohol, no drugs.   Exercise: stationary bike 6 days a week.  [facility]   No Known Allergies Family History  Problem Relation Age of Onset  . Heart attack Father        abnormal EKG; ? AVR  . Diabetes Father   . Heart disease Father   . Hyperlipidemia Mother   . Anxiety disorder Mother   . Depression Mother   . Irregular heart beat Sister   . Heart disease Maternal Grandfather   . Heart failure Maternal Grandfather   . Osteoporosis Maternal Grandmother   . Colon cancer Neg Hx     Current Outpatient Medications (Endocrine & Metabolic):  .  raloxifene (EVISTA) 60 MG tablet, Take 1 tablet (60 mg total) by mouth daily.    Current Outpatient Medications (Analgesics):  .  oxyCODONE-acetaminophen (PERCOCET/ROXICET) 5-325 MG tablet, Take 1 tablet by mouth every 6 (six) hours as needed for up to 5 days.  Current Outpatient Medications (Hematological):  .  ferrous sulfate 325 (65 FE) MG tablet, Take 325 mg by mouth daily with breakfast.  Current Outpatient Medications (Other):  .  amoxicillin (AMOXIL) 875 MG tablet, Take 1 tablet (875 mg total) by mouth 2 (two) times daily for 7 days. .  Calcium Carbonate-Vitamin D 600-400 MG-UNIT per tablet, Take 1 tablet by mouth 2 (two)  times daily.   .  clorazepate (TRANXENE) 7.5 MG tablet, Take 7.5 mg by mouth once as needed.   .  Magnesium  500 MG CAPS, Take 1 capsule by mouth daily. .  Multiple Vitamin (MULTIVITAMIN) tablet, Take 1 tablet by mouth daily.   Marland Kitchen  neomycin-polymyxin-hydrocortisone (CORTISPORIN) OTIC solution, 3 drops in R ear three times per day for 7 days .  omeprazole (PRILOSEC) 40 MG capsule, TAKE 1 CAPSULE BY MOUTH 2 TIMES A DAY .  QUEtiapine (SEROQUEL XR) 400 MG 24 hr tablet, Take 1.5 tablets by mouth at bedtime. .  sertraline (ZOLOFT) 100 MG tablet, Take 2 tablets by mouth daily. .  TURMERIC PO, Take 1 capsule by mouth daily. .  Vitamins-Lipotropics (LIPO-FLAVONOID PLUS PO), Take 1 capsule by mouth 2 (two) times daily.    Past medical history, social, surgical and family history all reviewed in electronic medical record.  No pertanent information unless stated regarding to the chief complaint.   Review of Systems:  No headache, visual changes, nausea, vomiting, diarrhea, constipation, dizziness, abdominal pain, skin rash, fevers, chills, night sweats, weight loss, swollen lymph nodes, body aches chest pain, shortness of breath, mood changes.  Positive muscle aches, joint swelling  Objective  Blood pressure 126/68, pulse (!) 103, height 5\' 1"  (1.549 m), weight 184 lb (83.5 kg), SpO2 95 %.    General: No apparent distress alert and oriented x3 mood and affect normal, dressed appropriately.  HEENT: Pupils equal, extraocular movements intact  Respiratory: Patient's speak in full sentences and does not appear short of breath  Cardiovascular: No lower extremity edema, non tender, no erythema  Skin: Warm dry intact with no signs of infection or rash on extremities or on axial skeleton.  Abdomen: Soft nontender  Neuro: Cranial nerves II through XII are intact, neurovascularly intact in all extremities with 2+ DTRs and 2+ pulses.  Lymph: No lymphadenopathy of posterior or anterior cervical chain or axillae  bilaterally.  Gait normal with good balance and coordination.  MSK:  Non tender with full range of motion and good stability and symmetric strength and tone of elbows, wrist, hip, knee and ankles bilaterally.  Patient's left shoulder does have significant amount of bruising noted.  Patient is unable to move in with severe amount of pain.  Patient has active flexion of approximately 25 degrees and forward flexion.  Patient is severely tender around the proximal aspect of the shoulder.     Impression and Recommendations:     This case required medical decision making of moderate complexity. The above documentation has been reviewed and is accurate and complete Judi Saa, DO       Note: This dictation was prepared with Dragon dictation along with smaller phrase technology. Any transcriptional errors that result from this process are unintentional.

## 2017-12-19 NOTE — Telephone Encounter (Signed)
Pt. Reports she had been prepping for a colonoscopy today, and fell going to the bathroom. Injured her left arm. Pain is a 10. Reports "some swelling." Hurts from elbow to her shoulder. Can not move arm without intense pain. Can wiggle all fingers and has normal sensation.Spoke with Sam, and they will call pt. In regard to being worked in with Dr. Katrinka Blazing. Pt. Will await call.  Answer Assessment - Initial Assessment Questions 1. MECHANISM: "How did the injury happen?"     Larey Seat going to the bathroom 2. ONSET: "When did the injury happen?" (Minutes or hours ago)      This morning 3. LOCATION: "Where is the injury located?"      Left arm - between elbow and shoulder 4. APPEARANCE of INJURY: "What does the injury look like?"      Some swelling 5. SEVERITY: "Can you use the arm normally?"      No 6. SWELLING or BRUISING: "is there any swelling or bruising?" If so, ask: "How large is it? (e.g., inches, centimeters)      Some 7. PAIN: "Is there pain?" If so, ask: "How bad is the pain?"    (Scale 1-10; or mild, moderate, severe)     10 8. TETANUS: For any breaks in the skin, ask: "When was the last tetanus booster?"     No 9. OTHER SYMPTOMS: "Do you have any other symptoms?"  (e.g., numbness in hand)     No 10. PREGNANCY: "Is there any chance you are pregnant?" "When was your last menstrual period?"       No  Protocols used: ARM INJURY-A-AH

## 2017-12-19 NOTE — Telephone Encounter (Signed)
Patient wants to be worked in with [employer].

## 2017-12-19 NOTE — Patient Instructions (Signed)
Good to see you  Ice 20 minutes 2 times daily. Usually after activity and before bed. Oxycodone for breakthrough pain up to 3 times a day  Duexis 3 times daily for 3 days as well  Dr. Ave Filter office will be calling you

## 2017-12-19 NOTE — Telephone Encounter (Signed)
Of course no charge due to her acute injury

## 2017-12-19 NOTE — Assessment & Plan Note (Signed)
Patient does have a left-sided moderately displaced comminuted fracture of the humerus noted.  There appears to be some chronic changes in the vicinity as well though.  Patient does have an old fracture of the clavicle noted.  Patient though at this point is in severe amount of pain and on physical exam this is consistent with an acute fracture.  Patient will be sent to orthopedic surgery to discuss other options.  Patient has other comorbidities which makes patient at least a moderate surgical risk and we did discuss with this patient.  Pain medications given today for short-term as well as anti-inflammatories and a sling for comfort.  Patient can follow-up with [place] if any questions.

## 2017-12-19 NOTE — Telephone Encounter (Signed)
Routed incorrectly  °

## 2017-12-22 ENCOUNTER — Encounter: Payer: Self-pay | Admitting: Family Medicine

## 2017-12-27 ENCOUNTER — Other Ambulatory Visit (INDEPENDENT_AMBULATORY_CARE_PROVIDER_SITE_OTHER): Payer: Medicare Other

## 2017-12-27 DIAGNOSIS — D508 Other iron deficiency anemias: Secondary | ICD-10-CM | POA: Diagnosis not present

## 2017-12-27 LAB — CBC WITH DIFFERENTIAL/PLATELET
BASOS PCT: 0.5 % (ref 0.0–3.0)
Basophils Absolute: 0 10*3/uL (ref 0.0–0.1)
Eosinophils Absolute: 0.2 10*3/uL (ref 0.0–0.7)
Eosinophils Relative: 2.5 % (ref 0.0–5.0)
HCT: 33.8 % — ABNORMAL LOW (ref 36.0–46.0)
Hemoglobin: 11.1 g/dL — ABNORMAL LOW (ref 12.0–15.0)
LYMPHS ABS: 2.2 10*3/uL (ref 0.7–4.0)
LYMPHS PCT: 32.2 % (ref 12.0–46.0)
MCHC: 32.7 g/dL (ref 30.0–36.0)
MCV: 84.8 fl (ref 78.0–100.0)
MONO ABS: 0.5 10*3/uL (ref 0.1–1.0)
MONOS PCT: 6.8 % (ref 3.0–12.0)
NEUTROS ABS: 3.9 10*3/uL (ref 1.4–7.7)
NEUTROS PCT: 58 % (ref 43.0–77.0)
PLATELETS: 209 10*3/uL (ref 150.0–400.0)
RBC: 3.99 Mil/uL (ref 3.87–5.11)
RDW: 17.1 % — AB (ref 11.5–15.5)
WBC: 6.7 10*3/uL (ref 4.0–10.5)

## 2017-12-27 LAB — IRON: Iron: 32 ug/dL — ABNORMAL LOW (ref 42–145)

## 2017-12-27 LAB — FERRITIN: Ferritin: 56.6 ng/mL (ref 10.0–291.0)

## 2017-12-28 LAB — IRON AND TIBC
IRON: 67 ug/dL (ref 27–139)
Iron Saturation: 19 % (ref 15–55)
TIBC: 350 ug/dL (ref 250–450)
UIBC: 283 ug/dL (ref 118–369)

## 2017-12-31 ENCOUNTER — Other Ambulatory Visit: Payer: Self-pay | Admitting: Family Medicine

## 2018-01-01 ENCOUNTER — Encounter: Payer: Self-pay | Admitting: Family Medicine

## 2018-01-01 ENCOUNTER — Other Ambulatory Visit: Payer: Self-pay | Admitting: Family Medicine

## 2018-01-01 MED ORDER — OMEPRAZOLE 40 MG PO CPDR: DELAYED_RELEASE_CAPSULE | ORAL | 1 refills | Status: DC

## 2018-01-01 NOTE — Telephone Encounter (Signed)
Patient is calling and states that she mentioned that to Dr. Russella DarStark and he said he did not see a reason why Dr. Milinda CaveMcGowen could not prescribe this for her. The patient states Dr. Russella DarStark put a message in her chart. Please advise.

## 2018-01-01 NOTE — Addendum Note (Signed)
Addended by: Smitty KnudsenSUTHERLAND, Vasily Fedewa K on: 01/01/2018 11:51 AM   Modules accepted: Orders

## 2018-01-01 NOTE — Telephone Encounter (Signed)
Pt advised and voiced understanding.   

## 2018-01-01 NOTE — Telephone Encounter (Signed)
Sure.  I will RF omeprazole right now.

## 2018-01-01 NOTE — Telephone Encounter (Signed)
Okay to fill Rx for omeprazole? Please advise. Thanks.

## 2018-02-06 ENCOUNTER — Encounter: Payer: Medicare Other | Admitting: Obstetrics & Gynecology

## 2018-02-25 ENCOUNTER — Encounter: Payer: Medicare Other | Admitting: Obstetrics & Gynecology

## 2018-02-27 ENCOUNTER — Encounter: Payer: Self-pay | Admitting: Obstetrics & Gynecology

## 2018-02-27 ENCOUNTER — Ambulatory Visit (INDEPENDENT_AMBULATORY_CARE_PROVIDER_SITE_OTHER): Payer: Medicare Other | Admitting: Obstetrics & Gynecology

## 2018-02-27 VITALS — BP 150/86 | Ht 61.25 in | Wt 192.8 lb

## 2018-02-27 DIAGNOSIS — M8589 Other specified disorders of bone density and structure, multiple sites: Secondary | ICD-10-CM

## 2018-02-27 DIAGNOSIS — Z01419 Encounter for gynecological examination (general) (routine) without abnormal findings: Secondary | ICD-10-CM

## 2018-02-27 DIAGNOSIS — Z780 Asymptomatic menopausal state: Secondary | ICD-10-CM | POA: Diagnosis not present

## 2018-02-27 MED ORDER — RALOXIFENE HCL 60 MG PO TABS: 60.0000 mg | ORAL_TABLET | Freq: Every day | ORAL | 4 refills | Status: DC

## 2018-02-27 NOTE — Progress Notes (Signed)
Lovinia Spencer 1951/02/07 [id number]   History:    68 y.o. G1P1L1 Married  RP:  Established patient presenting for annual gyn exam   HPI:  Menopause, well on no HRT.  No PMB.  No pelvic pain.  No pain with IC.  Breasts normal.  Urine/BMs normal.  BMI back up to 36.13.  Health labs with Fam MD.  Past medical history,surgical history, family history and social history were all reviewed and documented in the EPIC chart.  Gynecologic History No LMP recorded. Patient is postmenopausal. Contraception: post menopausal status Last Pap: 02/2015. Results were: Negative Last mammogram: 09/2017. Results were: Benign Bone Density: 01/2016 Osteopenia T-Score -1.8 at Spine Colonoscopy: 2015  Obstetric History OB History  Gravida Para Term Preterm AB Living  1 1 1     1   SAB TAB Ectopic Multiple Live Births               # Outcome Date GA Lbr Len/2nd Weight Sex Delivery Anes PTL Lv  1 Term              ROS: A ROS was performed and pertinent positives and negatives are included in the history.  GENERAL: No fevers or chills. HEENT: No change in vision, no earache, sore throat or sinus congestion. NECK: No pain or stiffness. CARDIOVASCULAR: No chest pain or pressure. No palpitations. PULMONARY: No shortness of breath, cough or wheeze. GASTROINTESTINAL: No abdominal pain, nausea, vomiting or diarrhea, melena or bright red blood per rectum. GENITOURINARY: No urinary frequency, urgency, hesitancy or dysuria. MUSCULOSKELETAL: No joint or muscle pain, no back pain, no recent trauma. DERMATOLOGIC: No rash, no itching, no lesions. ENDOCRINE: No polyuria, polydipsia, no heat or cold intolerance. No recent change in weight. HEMATOLOGICAL: No anemia or easy bruising or bleeding. NEUROLOGIC: No headache, seizures, numbness, tingling or weakness. PSYCHIATRIC: No depression, no loss of interest in normal activity or change in sleep pattern.     Exam:   BP (!) 150/86   Ht 5' 1.25" (1.556 m)   Wt 192 lb  12.8 oz (87.5 kg)   BMI 36.13 kg/m   Body mass index is 36.13 kg/m.  General appearance : Well developed well nourished female. No acute distress HEENT: Eyes: no retinal hemorrhage or exudates,  Neck supple, trachea midline, no carotid bruits, no thyroidmegaly Lungs: Clear to auscultation, no rhonchi or wheezes, or rib retractions  Heart: Regular rate and rhythm, no murmurs or gallops Breast:Examined in sitting and supine position were symmetrical in appearance, no palpable masses or tenderness,  no skin retraction, no nipple inversion, no nipple discharge, no skin discoloration, no axillary or supraclavicular lymphadenopathy Abdomen: no palpable masses or tenderness, no rebound or guarding Extremities: no edema or skin discoloration or tenderness  Pelvic: Vulva: Normal             Vagina: No gross lesions or discharge  Cervix: No gross lesions or discharge.  Pap reflex done  Uterus  AV, normal size, shape and consistency, non-tender and mobile  Adnexa  Without masses or tenderness  Anus: Normal   Assessment/Plan:  68 y.o. female for annual exam   1. Encounter for routine gynecological examination with Papanicolaou smear of cervix Normal gynecologic exam in menopause.  Pap reflex done today.  Breast exam normal.  Last screening mammogram August 2019 was benign.  Colonoscopy in 2015.  Health labs with family physician.  2. Postmenopausal Well on no hormone replacement therapy.  No postmenopausal bleeding.  3. Osteopenia of multiple sites  Osteopenia on bone density in December 2017.  T score of -1.8.  Schedule bone density here now.  Vitamin D supplements, calcium intake of 1.2 to 1.5 g/day including nutritional and supplemental, regular weightbearing physical activities.  Will continue on Evista. - DG Bone Density; Future  Other orders - raloxifene (EVISTA) 60 MG tablet; Take 1 tablet (60 mg total) by mouth daily.  Genia Del MD, 4:21 PM 02/27/2018

## 2018-02-28 LAB — PAP IG W/ RFLX HPV ASCU

## 2018-03-06 ENCOUNTER — Encounter: Payer: Self-pay | Admitting: Obstetrics & Gynecology

## 2018-03-06 ENCOUNTER — Ambulatory Visit (INDEPENDENT_AMBULATORY_CARE_PROVIDER_SITE_OTHER): Payer: Medicare Other

## 2018-03-06 DIAGNOSIS — Z780 Asymptomatic menopausal state: Secondary | ICD-10-CM

## 2018-03-06 DIAGNOSIS — M810 Age-related osteoporosis without current pathological fracture: Secondary | ICD-10-CM

## 2018-03-06 DIAGNOSIS — M8589 Other specified disorders of bone density and structure, multiple sites: Secondary | ICD-10-CM

## 2018-03-06 NOTE — Patient Instructions (Signed)
1. Encounter for routine gynecological examination with Papanicolaou smear of cervix Normal gynecologic exam in menopause.  Pap reflex done today.  Breast exam normal.  Last screening mammogram August 2019 was benign.  Colonoscopy in 2015.  Health labs with family physician.  2. Postmenopausal Well on no hormone replacement therapy.  No postmenopausal bleeding.  3. Osteopenia of multiple sites Osteopenia on bone density in December 2017.  T score of -1.8.  Schedule bone density here now.  Vitamin D supplements, calcium intake of 1.2 to 1.5 g/day including nutritional and supplemental, regular weightbearing physical activities.  Will continue on Evista. - DG Bone Density; Future  Other orders - raloxifene (EVISTA) 60 MG tablet; Take 1 tablet (60 mg total) by mouth daily.  Suzanne Spencer, it was a pleasure seeing you today!  I will inform you of your results as soon as they are available.

## 2018-03-07 ENCOUNTER — Other Ambulatory Visit: Payer: Self-pay | Admitting: Obstetrics & Gynecology

## 2018-03-07 DIAGNOSIS — M810 Age-related osteoporosis without current pathological fracture: Secondary | ICD-10-CM

## 2018-03-07 DIAGNOSIS — Z780 Asymptomatic menopausal state: Secondary | ICD-10-CM

## 2018-04-09 ENCOUNTER — Ambulatory Visit: Payer: Medicare Other | Admitting: Family Medicine

## 2018-04-10 ENCOUNTER — Telehealth: Payer: Self-pay | Admitting: *Deleted

## 2018-04-10 NOTE — Telephone Encounter (Signed)
Received fax from Dr. Magdalen Spatz office requesting labs be drawn for pt. Labs: Lipid panel, A1c, TSH, CBC and CMP  These labs were done on 10/08/17.   Do we need to repeat them or is it okay to send Dr. Evelene Croon a copy of them?   Please advise. Thanks.

## 2018-04-18 ENCOUNTER — Encounter: Payer: Self-pay | Admitting: Family Medicine

## 2018-04-18 ENCOUNTER — Ambulatory Visit: Payer: Medicare Other | Admitting: Family Medicine

## 2018-04-18 ENCOUNTER — Ambulatory Visit (INDEPENDENT_AMBULATORY_CARE_PROVIDER_SITE_OTHER)
Admission: RE | Admit: 2018-04-18 | Discharge: 2018-04-18 | Disposition: A | Discharge status: Home / Self Care | Payer: Medicare Other | Source: Ambulatory Visit | Attending: Family Medicine | Admitting: Family Medicine

## 2018-04-18 VITALS — BP 124/72 | HR 87 | Resp 16 | Ht 61.25 in | Wt 187.0 lb

## 2018-04-18 DIAGNOSIS — M7918 Myalgia, other site: Secondary | ICD-10-CM

## 2018-04-18 MED ORDER — PREDNISONE 5 MG PO TABS: ORAL_TABLET | ORAL | 0 refills | Status: DC

## 2018-04-18 MED ORDER — BACLOFEN 10 MG PO TABS: 5.0000 mg | ORAL_TABLET | Freq: Two times a day (BID) | ORAL | 0 refills | Status: DC | PRN

## 2018-04-18 NOTE — Patient Instructions (Signed)
Nice to meet you  Please try the prednisone  Please continue with heat  Please try wrapping with an ace wrap  Please see me back in 1-2 weeks if no better  I will call you with the results from today

## 2018-04-18 NOTE — Progress Notes (Signed)
Suzanne CourseKathleen Spencer - 68 y.o. female MRN [id number]  Date of birth: 1951-01-07  SUBJECTIVE:  Including CC & ROS.  No chief complaint on file.   Suzanne CourseKathleen Spencer is a 68 y.o. female that is presenting with right gluteal or proximal hamstring pain.  The pain is been ongoing for 2 to 3 weeks.  Seems to be staying the same with no improvement with home remedies.  Has tried anti-inflammatories with no improvement.  Denies any inciting event.  It is localized to the proximal hamstring versus right lower buttock.  Does not radiate down her leg.  The pain is sharp and stabbing.  She feels it with certain movements and walking.  No history of similar symptoms.  The pain is severe.   Review of Systems  Constitutional: Negative for fever.  HENT: Negative for congestion.   Respiratory: Negative for cough.   Cardiovascular: Negative for chest pain.  Gastrointestinal: Negative for abdominal pain.  Musculoskeletal: Positive for gait problem.  Skin: Negative for color change.  Neurological: Negative for weakness.  Psychiatric/Behavioral: Negative for agitation.    HISTORY: Past Medical, Surgical, Social, and Family History Reviewed & Updated per EMR.   Pertinent Historical Findings include:  Past Medical History:  Diagnosis Date  . Acute parotitis 11/2015   hx of:  sialolithiasis suspected by ENT--spont resolved.  . Allergy    SEASONAL  . Anxiety   . Atrophic vaginitis   . Cervical cancer screening    PAP normal 2017: GYN to repeat 2020.  Marland Kitchen. Chronic headaches    tension, mirgraines  . Depression   . Diverticulosis   . Duodenal ulcer 1989  . Dysplasia of cervix, low grade (CIN 1)    S/P LEEP  . Endometrial hyperplasia    SIMPLE  . GERD (gastroesophageal reflux disease)   . 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  Much improved at Dr. Michaelle CopasSmith's f/u 10/2015.  Marland Kitchen. Hiatal hernia   . Humerus fracture 12/19/2017   LEFT: mildly displaced comminuted fracture of the proximal left humerus.  .  Hyperlipidemia    LDL goal = < 140.  No meds as of 04/2016.  . Iron deficiency anemia 09/2017   Started iron 10/18/17.  Hemoccults neg 10/22/17.  Dr. Russella DarStark saw her 11/14/17 and plans to do EGD/Colon + check celiac labs.  . Iron deficiency anemia Fall 2019   Celiac screen NEG, hemoccults neg.  Pt to get EGD and TCS 12/19/17.  . Medial meniscus tear 04/2014   R knee  . Osteoarthritis    KNEES  . Osteoporosis    GYN managing; as of 01/2016 pt is on Evista.  She is intol of oral bisphosph due to GERD.  . Other and unspecified ovarian cysts   . Prediabetes 2018   A1c 6% 04/2016  . Proximal humerus fracture 12/19/2017   Mildly displaced and comminuted proximal left humeral head and neck fracture.  . Rib fracture 09/04/2017   Mildly displaced R 6th rib fx    Past Surgical History:  Procedure Laterality Date  . ABDOMINAL SURGERY  2003    EXPLORATORY LAPAROTOMY, LSO  . ADENOIDECTOMY  1954  . CARPAL TUNNEL RELEASE Bilateral 2001  . CHOLECYSTECTOMY  1989   stones  . COLONOSCOPY  2011; 03/2013   2011 NORMAL.  03/2013 TCS done for heme+ stool: no polyps.  Repeat 10 yrs (after 03/2023).  Marland Kitchen. DEXA  03/23/2014   Osteopenia.  Pt took prolia x 2 injections, then was switched to evista by her GYN.  DEXA  01/17/16 T score a bit worse, same regimen rec'd, repeat DEXA 1 yr per GYN.  Marland Kitchen ENDOMETRIAL BIOPSY  09/27/2015   PATH:  BENIGN  . endoscopy upper GI  2000; 05/2014   negative 2000.  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 Russella Dar is PPI bid long term.  Marland Kitchen HYSTEROSCOPY     UTERINE POLYPS  . MOUTH SURGERY    . OOPHORECTOMY  2003   LSO WITH EXPLORATORY LAPAROTOMY  . TUBAL LIGATION  1987    No Known Allergies  Family History  Problem Relation Age of Onset  . Heart attack Father        abnormal EKG; ? AVR  . Diabetes Father   . Heart disease Father   . Hyperlipidemia Mother   . Anxiety disorder Mother   . Depression Mother   .  Irregular heart beat Sister   . Heart disease Maternal Grandfather   . Heart failure Maternal Grandfather   . Osteoporosis Maternal Grandmother   . Colon cancer Neg Hx      Social History   Socioeconomic History  . Marital status: Married    Spouse name: Not on file  . Number of children: 1  . Years of education: Not on file  . Highest education level: Not on file  Occupational History  . 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 Financial resource strain: Not on file  . Food insecurity:    Worry: Not on file    Inability: Not on file  . Transportation needs:    Medical: Not on file    Non-medical: Not on file  Tobacco Use  . Smoking status: Never Smoker  . Smokeless tobacco: Never Used  Substance and Sexual Activity  . Alcohol use: Yes    Alcohol/week: 1.0 standard drinks    Types: 1 Glasses of wine per week    Comment: Rarely  . Drug use: No  . Sexual activity: Yes    Birth control/protection: Surgical  Lifestyle  . Physical activity:    Days per week: Not on file    Minutes per session: Not on file  . Stress: Not on file  Relationships  . Social connections:    Talks on phone: Not on file    Gets together: Not on file    Attends religious service: Not on file    Active member of club or organization: Not on file    Attends meetings of clubs or organizations: Not on file    Relationship status: Not on file  . Intimate partner violence:    Fear of current or ex partner: Not on file    Emotionally abused: Not on file    Physically abused: Not on file    Forced sexual activity: Not on file  Other Topics Concern  . Not on file  Social History Narrative   Married, one son.   Orig from [place] area, lives in [place].   Occupation: retired from [employer]).   No tob, rare, alcohol, no drugs.   Exercise: stationary bike 6 days a week.  [facility]     PHYSICAL EXAM:  VS: BP 124/72   Pulse 87    Resp 16   Ht 5' 1.25" (1.556 m)   Wt 187 lb (84.8 kg)   SpO2 97%   BMI 35.05 kg/m  Physical Exam Gen: NAD, alert, cooperative with exam, well-appearing ENT: normal lips, normal nasal mucosa,  Eye: normal EOM, normal conjunctiva and lids CV:  no edema, +2 pedal pulses   Resp: no accessory muscle use, non-labored,  Skin: no rashes, no areas of induration  Neuro: normal tone, normal sensation to touch Psych:  normal insight, alert and oriented MSK:  Right leg/buttock: Mild tenderness to the proximal hamstring Some pain with resistance to knee flexion. Normal internal and external rotation of the hip. Normal strength with hip flexion. Some weakness with hip abduction. Negative straight leg raise. Neurovascular intact     ASSESSMENT & PLAN:   Gluteal pain Unclear if this is more proximal hamstring pain versus piriformis or sciatic irritation at this level.  Does not have any radicular symptoms at this time.  Possible to be joint related. -Prednisone and baclofen. -X-ray. -If no improvement may need to consider injection or physical therapy.

## 2018-04-21 ENCOUNTER — Telehealth: Payer: Self-pay | Admitting: Family Medicine

## 2018-04-21 DIAGNOSIS — M7918 Myalgia, other site: Secondary | ICD-10-CM | POA: Insufficient documentation

## 2018-04-21 NOTE — Telephone Encounter (Signed)
Spoke with patient about xray.   Myra Rude, MD [facility] 04/21/2018, 4:19 PM

## 2018-04-21 NOTE — Assessment & Plan Note (Signed)
Unclear if this is more proximal hamstring pain versus piriformis or sciatic irritation at this level.  Does not have any radicular symptoms at this time.  Possible to be joint related. -Prednisone and baclofen. -X-ray. -If no improvement may need to consider injection or physical therapy.

## 2018-04-28 ENCOUNTER — Other Ambulatory Visit: Payer: Self-pay | Admitting: Otolaryngology

## 2018-04-28 DIAGNOSIS — H903 Sensorineural hearing loss, bilateral: Secondary | ICD-10-CM

## 2018-04-28 DIAGNOSIS — H9312 Tinnitus, left ear: Secondary | ICD-10-CM

## 2018-05-01 ENCOUNTER — Ambulatory Visit
Admission: RE | Admit: 2018-05-01 | Discharge: 2018-05-01 | Disposition: A | Discharge status: Home / Self Care | Payer: Medicare Other | Source: Ambulatory Visit | Attending: Otolaryngology | Admitting: Otolaryngology

## 2018-05-01 ENCOUNTER — Other Ambulatory Visit: Payer: Self-pay

## 2018-05-01 DIAGNOSIS — H9312 Tinnitus, left ear: Secondary | ICD-10-CM

## 2018-05-01 DIAGNOSIS — H903 Sensorineural hearing loss, bilateral: Secondary | ICD-10-CM

## 2018-05-01 MED ORDER — GADOBENATE DIMEGLUMINE 529 MG/ML IV SOLN
17.0000 mL | Freq: Once | INTRAVENOUS | Status: AC | PRN
  Administered 2018-05-01: 17 mL via INTRAVENOUS

## 2018-05-10 ENCOUNTER — Other Ambulatory Visit: Payer: Self-pay | Admitting: Family Medicine

## 2018-05-14 ENCOUNTER — Other Ambulatory Visit: Payer: Self-pay | Admitting: Family Medicine

## 2018-05-21 MED ORDER — BACLOFEN 10 MG PO TABS: ORAL_TABLET | ORAL | 1 refills | Status: DC

## 2018-05-21 NOTE — Addendum Note (Signed)
Addended by: Myra Rude on: 05/21/2018 09:32 AM   Modules accepted: Orders

## 2018-06-24 ENCOUNTER — Other Ambulatory Visit: Payer: Self-pay

## 2018-06-24 MED ORDER — OMEPRAZOLE 40 MG PO CPDR: DELAYED_RELEASE_CAPSULE | ORAL | 1 refills | Status: DC

## 2018-06-24 NOTE — Telephone Encounter (Signed)
LMTCB to schedule f/u or CPE.

## 2018-06-24 NOTE — Telephone Encounter (Signed)
Will eRx 30d supply with 1 RF. Needs o/v for CPE, or for f/u GERD if she prefers instead of CPE. In-office visit is fine with me as long as patient is feeling well.-thx

## 2018-06-24 NOTE — Telephone Encounter (Signed)
RF request for Omeprazole LOV: 12/16/17, acute and last CPE regarding GERD was 04/17/16 Next ov: none Last written: 01/01/18 (180,1) BID  Please advise, thanks. Medication pending

## 2018-06-25 NOTE — Telephone Encounter (Signed)
Pt has appt on 5/15 for f/u GERD

## 2018-06-27 ENCOUNTER — Ambulatory Visit (INDEPENDENT_AMBULATORY_CARE_PROVIDER_SITE_OTHER): Payer: Medicare Other | Admitting: Family Medicine

## 2018-06-27 ENCOUNTER — Encounter: Payer: Self-pay | Admitting: Family Medicine

## 2018-06-27 ENCOUNTER — Other Ambulatory Visit: Payer: Self-pay

## 2018-06-27 VITALS — Wt 176.0 lb

## 2018-06-27 DIAGNOSIS — D509 Iron deficiency anemia, unspecified: Secondary | ICD-10-CM | POA: Diagnosis not present

## 2018-06-27 DIAGNOSIS — Z79899 Other long term (current) drug therapy: Secondary | ICD-10-CM | POA: Diagnosis not present

## 2018-06-27 DIAGNOSIS — K219 Gastro-esophageal reflux disease without esophagitis: Secondary | ICD-10-CM

## 2018-06-27 DIAGNOSIS — E7849 Other hyperlipidemia: Secondary | ICD-10-CM

## 2018-06-27 MED ORDER — OMEPRAZOLE 40 MG PO CPDR: DELAYED_RELEASE_CAPSULE | ORAL | 1 refills | Status: DC

## 2018-06-27 NOTE — Addendum Note (Signed)
Addended by: Eulah Pont on: 06/27/2018 02:27 PM   Modules accepted: Orders

## 2018-06-27 NOTE — Progress Notes (Signed)
Virtual Visit via Video Note  I connected with pt on 06/27/18 at  9:00 AM EDT by a video enabled telemedicine application and verified that I am speaking with the correct person using two identifiers.  Location patient: home Location provider:work or home office Persons participating in the virtual visit: patient, provider  I discussed the limitations of evaluation and management by telemedicine and the availability of in person appointments. The patient expressed understanding and agreed to proceed.  Telemedicine visit is a necessity given the COVID-19 restrictions in place at the current time.  HPI: 68 y/o female being seen today for f/u iron def anemia and GERD. Last visit 11/2017 she had been on iron supp for 6 wks and was feeling improved and Hb was rising appropriately.  She had been set up by GI to get EGD and colonoscopy to eval for GI source (although hemoccult neg), but these procedures have not been done yet.  Interim hx: She is doing great.   Taking omeprazole as rx'd and feels no GERD or dyspepsia. Was taking iron supp but this caused GI upset/diarrhea --stopped it 4-6 wks ago. Stools are brown, no melena or diarrhea. Appetite excellent, energy level excellent. She had to cancel her endoscopies-->she had excessive urgent BMs with the prep and in the process of rushing to the bathroom she fell and broke her humerus.  The humerus has now healed (nonoperative tx) and she says she really DOES NOT want to get endoscopies now.  She is adamant about this.  ROS: See pertinent positives and negatives per HPI.  Past Medical History:  Diagnosis Date  . Acute parotitis 11/2015   hx of:  sialolithiasis suspected by ENT--spont resolved.  . Allergy    SEASONAL  . Anxiety   . Atrophic vaginitis   . Cervical cancer screening    PAP normal 2017: GYN to repeat 2020.  Marland Kitchen Chronic headaches    tension, mirgraines  . Depression   . Diverticulosis   . Duodenal ulcer 1989  . Dysplasia of  cervix, low grade (CIN 1)    S/P LEEP  . Endometrial hyperplasia    SIMPLE  . GERD (gastroesophageal reflux disease)   . 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  Much improved at Dr. Michaelle Copas f/u 10/2015.  Marland Kitchen Hiatal hernia   . Humerus fracture 12/19/2017   LEFT: mildly displaced comminuted fracture of the proximal left humerus.  . Hyperlipidemia    LDL goal = < 140.  No meds as of 04/2016.  . Iron deficiency anemia 09/2017   Started iron 10/18/17.  Hemoccults neg 10/22/17.  Dr. Russella Dar saw her 11/14/17 and plans to do EGD/Colon + check celiac labs.  . Iron deficiency anemia Fall 2019   Celiac screen NEG, hemoccults neg.  Pt to get EGD and TCS 12/19/17.  . Medial meniscus tear 04/2014   R knee  . Osteoarthritis    KNEES  . Osteoporosis    GYN managing; as of 01/2016 pt is on Evista.  She is intol of oral bisphosph due to GERD.  . Other and unspecified ovarian cysts   . Prediabetes 2018   A1c 6% 04/2016  . Proximal humerus fracture 12/19/2017   Mildly displaced and comminuted proximal left humeral head and neck fracture.  . Rib fracture 09/04/2017   Mildly displaced R 6th rib fx    Past Surgical History:  Procedure Laterality Date  . ABDOMINAL SURGERY  2003    EXPLORATORY LAPAROTOMY, LSO  . ADENOIDECTOMY  1954  .  CARPAL TUNNEL RELEASE Bilateral 2001  . CHOLECYSTECTOMY  1989   stones  . COLONOSCOPY  2011; 03/2013   2011 NORMAL.  03/2013 TCS done for heme+ stool: no polyps.  Repeat 10 yrs (after 03/2023).  Marland Kitchen. DEXA  03/23/2014   Osteopenia.  Pt took prolia x 2 injections, then was switched to evista by her GYN.  DEXA 01/17/16 T score a bit worse, same regimen rec'd, repeat DEXA 1 yr per GYN.  Marland Kitchen. ENDOMETRIAL BIOPSY  09/27/2015   PATH:  BENIGN  . endoscopy upper GI  2000; 05/2014   negative 2000.  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 Russella DarStark is PPI bid long term.  Marland Kitchen. HYSTEROSCOPY     UTERINE POLYPS  . MOUTH  SURGERY    . OOPHORECTOMY  2003   LSO WITH EXPLORATORY LAPAROTOMY  . TUBAL LIGATION  1987    Family History  Problem Relation Age of Onset  . Heart attack Father        abnormal EKG; ? AVR  . Diabetes Father   . Heart disease Father   . Hyperlipidemia Mother   . Anxiety disorder Mother   . Depression Mother   . Irregular heart beat Sister   . Heart disease Maternal Grandfather   . Heart failure Maternal Grandfather   . Osteoporosis Maternal Grandmother   . Colon cancer Neg Hx      Current Outpatient Medications:  .  Calcium Carbonate-Vitamin D 600-400 MG-UNIT per tablet, Take 1 tablet by mouth 2 (two) times daily.  , Disp: , Rfl:  .  clorazepate (TRANXENE) 7.5 MG tablet, Take 7.5 mg by mouth once as needed.  , Disp: , Rfl:  .  Magnesium 500 MG CAPS, Take 1 capsule by mouth daily., Disp: , Rfl:  .  Multiple Vitamin (MULTIVITAMIN) tablet, Take 1 tablet by mouth daily.  , Disp: , Rfl:  .  omeprazole (PRILOSEC) 40 MG capsule, TAKE 1 CAPSULE BY MOUTH 2 TIMES A DAY, Disp: 60 capsule, Rfl: 1 .  raloxifene (EVISTA) 60 MG tablet, Take 1 tablet (60 mg total) by mouth daily., Disp: 90 tablet, Rfl: 4 .  TRINTELLIX 20 MG TABS tablet, Take 20 mg by mouth daily., Disp: , Rfl:  .  TURMERIC PO, Take 1 capsule by mouth daily., Disp: , Rfl:  .  baclofen (LIORESAL) 10 MG tablet, TAKE 1/2 TABLET TWICE A DAY AS NEEDED MUSCLE SPASMS, Disp: 30 tablet, Rfl: 1  EXAM:  VITALS per patient if applicable:  GENERAL: alert, oriented, appears well and in no acute distress  HEENT: atraumatic, conjunttiva clear, no obvious abnormalities on inspection of external nose and ears  NECK: normal movements of the head and neck  LUNGS: on inspection no signs of respiratory distress, breathing rate appears normal, no obvious gross SOB, gasping or wheezing  CV: no obvious cyanosis  MS: moves all visible extremities without noticeable abnormality  PSYCH/NEURO: pleasant and cooperative, no obvious depression or  anxiety, speech and thought processing grossly intact  LABS: none today    Chemistry      Component Value Date/Time   NA 141 10/08/2017   K 4.5 10/08/2017   CL 107 04/17/2016 0920   CO2 30 04/17/2016 0920   BUN 19 10/08/2017   CREATININE 19.0 (A) 10/08/2017   CREATININE 1.01 04/17/2016 0920   CREATININE 0.84 03/10/2013 1128   GLU 94 10/08/2017      Component Value Date/Time  CALCIUM 9.5 04/17/2016 0920   ALKPHOS 96 10/08/2017   AST 21 10/08/2017   ALT 18 10/08/2017   BILITOT 0.3 04/17/2016 0920     Lab Results  Component Value Date   WBC 6.7 12/27/2017   HGB 11.1 (L) 12/27/2017   HCT 33.8 (L) 12/27/2017   MCV 84.8 12/27/2017   PLT 209.0 12/27/2017   Lab Results  Component Value Date   IRON 67 12/27/2017   TIBC 350 12/27/2017   FERRITIN 56.6 12/27/2017    Lab Results  Component Value Date   HGBA1C 5.7 10/08/2017   ASSESSMENT AND PLAN:  Discussed the following assessment and plan:  1) GERD, with hx of IDA. Recheck Hb/iron studies to make sure it is now all normal. If so, then ok to forego endoscopies per pt's adamant request at this time. No iron supplement in the last 6 wks, ok to stay off if labs normal now. GERD sx's excellently controlled on bid omeprazole, RF'd today.  2) High risk med use: her psychiatrist, Dr. Evelene Croon, has requested that I check FLP, a1c, TSH, and CMP for routine monitoring on high risk med (tranxene). Future labs ordered and will fax results to Dr. Evelene Croon.  I discussed the assessment and treatment plan with the patient. The patient was provided an opportunity to ask questions and all were answered. The patient agreed with the plan and demonstrated an understanding of the instructions.   The patient was advised to call back or seek an in-person evaluation if the symptoms worsen or if the condition fails to improve as anticipated.  F/u: 6 mo CPE  Signed:  Santiago Bumpers, MD           06/27/2018

## 2018-06-27 NOTE — Addendum Note (Signed)
Addended by: Dixon Luczak M on: 06/27/2018 02:27 PM   Modules accepted: Orders  

## 2018-06-27 NOTE — Addendum Note (Signed)
Addended by: Kailie Polus M on: 06/27/2018 02:27 PM   Modules accepted: Orders  

## 2018-06-28 LAB — CBC WITH DIFFERENTIAL/PLATELET
Absolute Monocytes: 462 cells/uL (ref 200–950)
Basophils Absolute: 59 cells/uL (ref 0–200)
Basophils Relative: 0.9 %
Eosinophils Absolute: 143 cells/uL (ref 15–500)
Eosinophils Relative: 2.2 %
HCT: 38.6 % (ref 35.0–45.0)
Hemoglobin: 12.8 g/dL (ref 11.7–15.5)
Lymphs Abs: 3010 cells/uL (ref 850–3900)
MCH: 28.9 pg (ref 27.0–33.0)
MCHC: 33.2 g/dL (ref 32.0–36.0)
MCV: 87.1 fL (ref 80.0–100.0)
MPV: 13.2 fL — ABNORMAL HIGH (ref 7.5–12.5)
Monocytes Relative: 7.1 %
Neutro Abs: 2828 cells/uL (ref 1500–7800)
Neutrophils Relative %: 43.5 %
Platelets: 178 10*3/uL (ref 140–400)
RBC: 4.43 10*6/uL (ref 3.80–5.10)
RDW: 13.2 % (ref 11.0–15.0)
Total Lymphocyte: 46.3 %
WBC: 6.5 10*3/uL (ref 3.8–10.8)

## 2018-06-28 LAB — COMPREHENSIVE METABOLIC PANEL
AG Ratio: 1.6 (calc) (ref 1.0–2.5)
ALT: 19 U/L (ref 6–29)
AST: 25 U/L (ref 10–35)
Albumin: 4.1 g/dL (ref 3.6–5.1)
Alkaline phosphatase (APISO): 85 U/L (ref 37–153)
BUN: 17 mg/dL (ref 7–25)
CO2: 29 mmol/L (ref 20–32)
Calcium: 9.3 mg/dL (ref 8.6–10.4)
Chloride: 104 mmol/L (ref 98–110)
Creat: 0.75 mg/dL (ref 0.50–0.99)
Globulin: 2.5 g/dL (calc) (ref 1.9–3.7)
Glucose, Bld: 90 mg/dL (ref 65–99)
Potassium: 4 mmol/L (ref 3.5–5.3)
Sodium: 140 mmol/L (ref 135–146)
Total Bilirubin: 0.3 mg/dL (ref 0.2–1.2)
Total Protein: 6.6 g/dL (ref 6.1–8.1)

## 2018-06-28 LAB — HEMOGLOBIN A1C
Hgb A1c MFr Bld: 5.4 % of total Hgb (ref <5.7)
Mean Plasma Glucose: 108 (calc)
eAG (mmol/L): 6 (calc)

## 2018-06-28 LAB — IRON,TIBC AND FERRITIN PANEL
%SAT: 17 % (calc) (ref 16–45)
Ferritin: 46 ng/mL (ref 16–288)
Iron: 70 ug/dL (ref 45–160)
TIBC: 414 mcg/dL (calc) (ref 250–450)

## 2018-06-28 LAB — TSH: TSH: 0.94 mIU/L (ref 0.40–4.50)

## 2018-06-28 LAB — LIPID PANEL
Cholesterol: 210 mg/dL — ABNORMAL HIGH (ref <200)
HDL: 79 mg/dL (ref >=50)
LDL Cholesterol (Calc): 114 mg/dL (calc) — ABNORMAL HIGH
Non-HDL Cholesterol (Calc): 131 mg/dL (calc) — ABNORMAL HIGH (ref <130)
Total CHOL/HDL Ratio: 2.7 (calc) (ref <5.0)
Triglycerides: 75 mg/dL (ref <150)

## 2018-06-30 ENCOUNTER — Encounter: Payer: Self-pay | Admitting: Family Medicine

## 2018-10-13 LAB — HM MAMMOGRAPHY

## 2018-10-16 ENCOUNTER — Encounter: Payer: Self-pay | Admitting: Family Medicine

## 2018-10-30 ENCOUNTER — Telehealth: Payer: Self-pay | Admitting: Family Medicine

## 2018-10-30 NOTE — Telephone Encounter (Signed)
[facility] mammogram NORMAL.

## 2018-10-31 NOTE — Telephone Encounter (Signed)
Pt was called and given results. She verbalized understanding  

## 2018-11-19 ENCOUNTER — Encounter: Payer: Self-pay | Admitting: Gynecology

## 2018-12-15 ENCOUNTER — Encounter: Payer: Medicare Other | Admitting: Family Medicine

## 2018-12-17 ENCOUNTER — Other Ambulatory Visit: Payer: Self-pay | Admitting: Family Medicine

## 2018-12-29 ENCOUNTER — Encounter: Payer: Self-pay | Admitting: Family Medicine

## 2018-12-29 ENCOUNTER — Ambulatory Visit (INDEPENDENT_AMBULATORY_CARE_PROVIDER_SITE_OTHER): Payer: Medicare Other | Admitting: Family Medicine

## 2018-12-29 ENCOUNTER — Other Ambulatory Visit: Payer: Self-pay

## 2018-12-29 VITALS — BP 120/74 | HR 81 | Temp 98.3°F | Resp 16 | Ht 61.25 in | Wt 171.8 lb

## 2018-12-29 DIAGNOSIS — K219 Gastro-esophageal reflux disease without esophagitis: Secondary | ICD-10-CM | POA: Diagnosis not present

## 2018-12-29 DIAGNOSIS — E669 Obesity, unspecified: Secondary | ICD-10-CM

## 2018-12-29 DIAGNOSIS — Z0000 Encounter for general adult medical examination without abnormal findings: Secondary | ICD-10-CM

## 2018-12-29 MED ORDER — OMEPRAZOLE 40 MG PO CPDR: DELAYED_RELEASE_CAPSULE | ORAL | 3 refills | Status: DC

## 2018-12-29 NOTE — Progress Notes (Signed)
Office Note 12/29/2018  CC:  Chief Complaint  Patient presents with  . Annual Exam    pt is not fasting    HPI:  Suzanne Spencer is a 68 y.o. female who is here for annual health maintenance exam.  Feeling well.  Good activity level and diet. Taking omeprazole 40mg  bid and feels no dyspepsia/GERD. Has not tried dosing less than bid in a few years.  Past Medical History:  Diagnosis Date  . Acute parotitis 11/2015   hx of:  sialolithiasis suspected by ENT--spont resolved.  . Allergy    SEASONAL  . Anxiety   . Atrophic vaginitis   . Cervical cancer screening    PAP normal 2017: GYN to repeat 2020.  2021 Chronic headaches    tension, mirgraines  . Depression   . Diverticulosis   . Duodenal ulcer 1989  . Dysplasia of cervix, low grade (CIN 1)    S/P LEEP  . Endometrial hyperplasia    SIMPLE  . GERD (gastroesophageal reflux disease)   . 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  Much improved at Dr. 10/2015 f/u 10/2015.  11/2015 Hiatal hernia   . Humerus fracture 12/19/2017   LEFT: mildly displaced comminuted fracture of the proximal left humerus.  . Hyperlipidemia    LDL goal = < 140.  No meds as of 04/2016.  . Iron deficiency anemia Fall 2019   Celiac screen NEG, hemoccults neg.  Pt to get EGD and TCS 12/19/17.  Endoscopies cancelled b/c pt had horrible time w/prep and fell and broke humerus when trying to reach her bathroom quickly--pt declines endosc as of 06/2018.  Labs stable/normal at that time.  . Medial meniscus tear 04/2014   R knee  . Osteoarthritis    KNEES  . Osteoporosis    GYN managing; as of 01/2016 pt is on Evista.  She is intol of oral bisphosph due to GERD.  . Other and unspecified ovarian cysts   . Prediabetes 2018   A1c 6% 04/2016  . Proximal humerus fracture 12/19/2017   Mildly displaced and comminuted proximal left humeral head and neck fracture.  . Rib fracture 09/04/2017   Mildly displaced R 6th rib fx    Past Surgical History:  Procedure  Laterality Date  . ABDOMINAL SURGERY  2003    EXPLORATORY LAPAROTOMY, LSO  . ADENOIDECTOMY  1954  . CARPAL TUNNEL RELEASE Bilateral 2001  . CHOLECYSTECTOMY  1989   stones  . COLONOSCOPY  2011; 03/2013   2011 NORMAL.  03/2013 TCS done for heme+ stool: no polyps.  Repeat 10 yrs (after 03/2023).  04/2023 DEXA  03/23/2014   Osteopenia.  Pt took prolia x 2 injections, then was switched to evista by her GYN.  DEXA 01/17/16 T score a bit worse, same regimen rec'd, repeat DEXA 1 yr per GYN.  14/5/17 ENDOMETRIAL BIOPSY  09/27/2015   PATH:  BENIGN  . endoscopy upper GI  2000; 05/2014   negative 2000.  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 2017 is PPI bid long term.  Russella Dar HYSTEROSCOPY     UTERINE POLYPS  . MOUTH SURGERY    . OOPHORECTOMY  2003   LSO WITH EXPLORATORY LAPAROTOMY  . TUBAL LIGATION  1987    Family History  Problem Relation Age of Onset  . Heart attack Father        abnormal EKG; ? AVR  . Diabetes Father   .  Heart disease Father   . Hyperlipidemia Mother   . Anxiety disorder Mother   . Depression Mother   . Irregular heart beat Sister   . Heart disease Maternal Grandfather   . Heart failure Maternal Grandfather   . Osteoporosis Maternal Grandmother   . Colon cancer Neg Hx     Social History   Socioeconomic History  . Marital status: Married    Spouse name: Not on file  . Number of children: 1  . Years of education: Not on file  . Highest education level: Not on file  Occupational History  . Occupation: retired    Fish farm manager: NOT EMPLOYED  Social Needs  . Financial resource strain: Not on file  . Food insecurity    Worry: Not on file    Inability: Not on file  . Transportation needs    Medical: Not on file    Non-medical: Not on file  Tobacco Use  . Smoking status: Never Smoker  . Smokeless tobacco: Never Used  Substance and Sexual Activity  . Alcohol use: Yes    Alcohol/week: 1.0 standard drinks     Types: 1 Glasses of wine per week    Comment: Rarely  . Drug use: No  . Sexual activity: Yes    Birth control/protection: Surgical  Lifestyle  . Physical activity    Days per week: Not on file    Minutes per session: Not on file  . Stress: Not on file  Relationships  . Social Herbalist on phone: Not on file    Gets together: Not on file    Attends religious service: Not on file    Active member of club or organization: Not on file    Attends meetings of clubs or organizations: Not on file    Relationship status: Not on file  . Intimate partner violence    Fear of current or ex partner: Not on file    Emotionally abused: Not on file    Physically abused: Not on file    Forced sexual activity: Not on file  Other Topics Concern  . Not on file  Social History Narrative   Married, one son.   Orig from [place] area, lives in [place].   Occupation: retired from [employer]ist, research (life sciences)).   No tob, rare, alcohol, no drugs.   Exercise: stationary bike 6 days a week.  [facility].    Outpatient Medications Prior to Visit  Medication Sig Dispense Refill  . Calcium Carbonate-Vitamin D 600-400 MG-UNIT per tablet Take 1 tablet by mouth daily.     . clorazepate (TRANXENE) 7.5 MG tablet Take 7.5 mg by mouth once as needed.      . Magnesium 500 MG CAPS Take 1 capsule by mouth daily.    . Multiple Vitamin (MULTIVITAMIN) tablet Take 1 tablet by mouth daily.      Marland Kitchen omeprazole (PRILOSEC) 40 MG capsule TAKE 1 CAPSULE BY MOUTH TWICE A DAY 180 capsule 1  . raloxifene (EVISTA) 60 MG tablet Take 1 tablet (60 mg total) by mouth daily. 90 tablet 4  . TRINTELLIX 20 MG TABS tablet Take 20 mg by mouth daily.    . TURMERIC PO Take 1 capsule by mouth daily.    . baclofen (LIORESAL) 10 MG tablet TAKE 1/2 TABLET TWICE A DAY AS NEEDED MUSCLE SPASMS (Patient not taking: Reported on 12/29/2018) 30 tablet 1   No facility-administered medications prior to visit.  No  Known Allergies  ROS Review of Systems  Constitutional: Negative for appetite change, chills, fatigue and fever.  HENT: Negative for congestion, dental problem, ear pain and sore throat.   Eyes: Negative for discharge, redness and visual disturbance.  Respiratory: Negative for cough, chest tightness, shortness of breath and wheezing.   Cardiovascular: Negative for chest pain, palpitations and leg swelling.  Gastrointestinal: Negative for abdominal pain, blood in stool, diarrhea, nausea and vomiting.  Genitourinary: Negative for difficulty urinating, dysuria, flank pain, frequency, hematuria and urgency.  Musculoskeletal: Negative for arthralgias, back pain, joint swelling, myalgias and neck stiffness.  Skin: Negative for pallor and rash.  Neurological: Negative for dizziness, speech difficulty, weakness and headaches.  Hematological: Negative for adenopathy. Does not bruise/bleed easily.  Psychiatric/Behavioral: Negative for confusion and sleep disturbance. The patient is not nervous/anxious.     PE; Blood pressure 120/74, pulse 81, temperature 98.3 F (36.8 C), temperature source Temporal, resp. rate 16, height 5' 1.25" (1.556 m), weight 171 lb 12.8 oz (77.9 kg), SpO2 95 %. Body mass index is 32.2 kg/m. Exam chaperoned by Emi Holes, CMA.  Gen: Alert, well appearing.  Patient is oriented to person, place, time, and situation. AFFECT: pleasant, lucid thought and speech. ENT: Ears: EACs clear, normal epithelium.  TMs with good light reflex and landmarks bilaterally.  Eyes: no injection, icteris, swelling, or exudate.  EOMI, PERRLA. Nose: no drainage or turbinate edema/swelling.  No injection or focal lesion.  Mouth: lips without lesion/swelling.  Oral mucosa pink and moist.  Dentition intact and without obvious caries or gingival swelling.  Oropharynx without erythema, exudate, or swelling.  Neck: supple/nontender.  No LAD, mass, or TM.  Carotid pulses 2+ bilaterally, without  bruits. CV: RRR, no m/r/g.   LUNGS: CTA bilat, nonlabored resps, good aeration in all lung fields. ABD: soft, NT, ND, BS normal.  No hepatospenomegaly or mass.  No bruits. EXT: no clubbing, cyanosis, or edema.  Musculoskeletal: no joint swelling, erythema, warmth, or tenderness.  ROM of all joints intact. Skin - no sores or suspicious lesions or rashes or color changes   Pertinent labs:  Lab Results  Component Value Date   TSH 0.94 06/27/2018   Lab Results  Component Value Date   WBC 6.5 06/27/2018   HGB 12.8 06/27/2018   HCT 38.6 06/27/2018   MCV 87.1 06/27/2018   PLT 178 06/27/2018   Lab Results  Component Value Date   IRON 70 06/27/2018   TIBC 414 06/27/2018   FERRITIN 46 06/27/2018    Lab Results  Component Value Date   CREATININE 0.75 06/27/2018   BUN 17 06/27/2018   NA 140 06/27/2018   K 4.0 06/27/2018   CL 104 06/27/2018   CO2 29 06/27/2018   Lab Results  Component Value Date   ALT 19 06/27/2018   AST 25 06/27/2018   ALKPHOS 96 10/08/2017   BILITOT 0.3 06/27/2018   Lab Results  Component Value Date   CHOL 210 (H) 06/27/2018   Lab Results  Component Value Date   HDL 79 06/27/2018   Lab Results  Component Value Date   LDLCALC 114 (H) 06/27/2018   Lab Results  Component Value Date   TRIG 75 06/27/2018   Lab Results  Component Value Date   CHOLHDL 2.7 06/27/2018   Lab Results  Component Value Date   HGBA1C 5.4 06/27/2018    ASSESSMENT AND PLAN:   Health maintenance exam: Reviewed age and gender appropriate health maintenance issues (prudent diet, regular exercise,  health risks of tobacco and excessive alcohol, use of seatbelts, fire alarms in home, use of sunscreen).  Also reviewed age and gender appropriate health screening as well as vaccine recommendations. Vaccines: ALL UTD. Labs: none indicated today: all good 06/2018. Cervical ca screening: GYN->Dr. Seymour BarsLavoie.   Breast ca screening: repeat mammogram due 09/2019. Colon ca screening:  next colonoscopy 03/2023.  GERD: I encouraged her to try cutting back to qd omeprazole to see how things go. She said she'd try. She'll start taking otc vit 12 200 mcg tab qd. Consider b12 level check at next f/u 1 yr.  An After Visit Summary was printed and given to the patient.  FOLLOW UP:  No follow-ups on file.  Signed:  Santiago BumpersPhil McGowen, MD           12/29/2018

## 2018-12-29 NOTE — Patient Instructions (Signed)
Start over the counter vitamin B12, 200 mcg once a day   Health Maintenance, Female Adopting a healthy lifestyle and getting preventive care are important in promoting health and wellness. Ask your health care provider about:  The right schedule for you to have regular tests and exams.  Things you can do on your own to prevent diseases and keep yourself healthy. What should I know about diet, weight, and exercise? Eat a healthy diet   Eat a diet that includes plenty of vegetables, fruits, low-fat dairy products, and lean protein.  Do not eat a lot of foods that are high in solid fats, added sugars, or sodium. Maintain a healthy weight Body mass index (BMI) is used to identify weight problems. It estimates body fat based on height and weight. Your health care provider can help determine your BMI and help you achieve or maintain a healthy weight. Get regular exercise Get regular exercise. This is one of the most important things you can do for your health. Most adults should:  Exercise for at least 150 minutes each week. The exercise should increase your heart rate and make you sweat (moderate-intensity exercise).  Do strengthening exercises at least twice a week. This is in addition to the moderate-intensity exercise.  Spend less time sitting. Even light physical activity can be beneficial. Watch cholesterol and blood lipids Have your blood tested for lipids and cholesterol at 69 years of age, then have this test every 5 years. Have your cholesterol levels checked more often if:  Your lipid or cholesterol levels are high.  You are older than 69 years of age.  You are at high risk for heart disease. What should I know about cancer screening? Depending on your health history and family history, you may need to have cancer screening at various ages. This may include screening for:  Breast cancer.  Cervical cancer.  Colorectal cancer.  Skin cancer.  Lung cancer. What should  I know about heart disease, diabetes, and high blood pressure? Blood pressure and heart disease  High blood pressure causes heart disease and increases the risk of stroke. This is more likely to develop in people who have high blood pressure readings, are of African descent, or are overweight.  Have your blood pressure checked: ? Every 3-5 years if you are 34-44 years of age. ? Every year if you are 44 years old or older. Diabetes Have regular diabetes screenings. This checks your fasting blood sugar level. Have the screening done:  Once every three years after age 68 if you are at a normal weight and have a low risk for diabetes.  More often and at a younger age if you are overweight or have a high risk for diabetes. What should I know about preventing infection? Hepatitis B If you have a higher risk for hepatitis B, you should be screened for this virus. Talk with your health care provider to find out if you are at risk for hepatitis B infection. Hepatitis C Testing is recommended for:  Everyone born from [birth year] through 1966.  Anyone with known risk factors for hepatitis C. Sexually transmitted infections (STIs)  Get screened for STIs, including gonorrhea and chlamydia, if: ? You are sexually active and are younger than 69 years of age. ? You are older than 69 years of age and your health care provider tells you that you are at risk for this type of infection. ? Your sexual activity has changed since you were last screened, and you are  at increased risk for chlamydia or gonorrhea. Ask your health care provider if you are at risk.  Ask your health care provider about whether you are at high risk for HIV. Your health care provider may recommend a prescription medicine to help prevent HIV infection. If you choose to take medicine to prevent HIV, you should first get tested for HIV. You should then be tested every 3 months for as long as you are taking the medicine. Pregnancy  If you are  about to stop having your period (premenopausal) and you may become pregnant, seek counseling before you get pregnant.  Take 400 to 800 micrograms (mcg) of folic acid every day if you become pregnant.  Ask for birth control (contraception) if you want to prevent pregnancy. Osteoporosis and menopause Osteoporosis is a disease in which the bones lose minerals and strength with aging. This can result in bone fractures. If you are 18 years old or older, or if you are at risk for osteoporosis and fractures, ask your health care provider if you should:  Be screened for bone loss.  Take a calcium or vitamin D supplement to lower your risk of fractures.  Be given hormone replacement therapy (HRT) to treat symptoms of menopause. Follow these instructions at home: Lifestyle  Do not use any products that contain nicotine or tobacco, such as cigarettes, e-cigarettes, and chewing tobacco. If you need help quitting, ask your health care provider.  Do not use street drugs.  Do not share needles.  Ask your health care provider for help if you need support or information about quitting drugs. Alcohol use  Do not drink alcohol if: ? Your health care provider tells you not to drink. ? You are pregnant, may be pregnant, or are planning to become pregnant.  If you drink alcohol: ? Limit how much you use to 0-1 drink a day. ? Limit intake if you are breastfeeding.  Be aware of how much alcohol is in your drink. In the [place] one drink equals one 12 oz bottle of beer (355 mL), one 5 oz glass of wine (148 mL), or one 1 oz glass of hard liquor (44 mL). General instructions  Schedule regular health, dental, and eye exams.  Stay current with your vaccines.  Tell your health care provider if: ? You often feel depressed. ? You have ever been abused or do not feel safe at home. Summary  Adopting a healthy lifestyle and getting preventive care are important in promoting health and wellness.  Follow  your health care provider's instructions about healthy diet, exercising, and getting tested or screened for diseases.  Follow your health care provider's instructions on monitoring your cholesterol and blood pressure. This information is not intended to replace advice given to you by your health care provider. Make sure you discuss any questions you have with your health care provider. Document Released: 08/14/2010 Document Revised: 01/22/2018 Document Reviewed: 01/22/2018 Elsevier Patient Education  2020 [employer].

## 2019-03-03 ENCOUNTER — Encounter: Payer: Medicare Other | Admitting: Obstetrics & Gynecology

## 2019-03-05 ENCOUNTER — Encounter: Payer: Medicare Other | Admitting: Obstetrics & Gynecology

## 2019-03-06 ENCOUNTER — Other Ambulatory Visit: Payer: Self-pay

## 2019-03-09 ENCOUNTER — Encounter: Payer: Self-pay | Admitting: Obstetrics & Gynecology

## 2019-03-09 ENCOUNTER — Ambulatory Visit: Payer: Medicare Other | Admitting: Obstetrics & Gynecology

## 2019-03-09 ENCOUNTER — Other Ambulatory Visit: Payer: Self-pay

## 2019-03-09 VITALS — BP 120/70 | Ht 61.25 in | Wt 169.0 lb

## 2019-03-09 DIAGNOSIS — E6609 Other obesity due to excess calories: Secondary | ICD-10-CM | POA: Diagnosis not present

## 2019-03-09 DIAGNOSIS — Z01419 Encounter for gynecological examination (general) (routine) without abnormal findings: Secondary | ICD-10-CM

## 2019-03-09 DIAGNOSIS — Z780 Asymptomatic menopausal state: Secondary | ICD-10-CM | POA: Diagnosis not present

## 2019-03-09 DIAGNOSIS — Z6831 Body mass index (BMI) 31.0-31.9, adult: Secondary | ICD-10-CM

## 2019-03-09 DIAGNOSIS — M8589 Other specified disorders of bone density and structure, multiple sites: Secondary | ICD-10-CM | POA: Diagnosis not present

## 2019-03-09 MED ORDER — RALOXIFENE HCL 60 MG PO TABS: 60.0000 mg | ORAL_TABLET | Freq: Every day | ORAL | 4 refills | Status: DC

## 2019-03-09 NOTE — Patient Instructions (Signed)
1. Well female exam with routine gynecological exam Normal gynecologic exam in menopause.  Pap test Negative January 2020, will repeat at 3 years.  Breast exam normal.  Screening mammogram August 2020 was benign.  Colonoscopy in 2015.  Health labs with family physician.    2. Postmenopausal Well on no hormone replacement therapy.  No postmenopausal bleeding.  3. Osteopenia of multiple sites Bone density January 2020 osteopenia with lowest site at the left femoral neck with a T score of -1.7.  Well on raloxifene.  No contraindication to continue.  Prescription sent to pharmacy.  We will repeat a bone density next year at 2 years.  Vitamin D supplements, calcium intake of 1200 mg daily and regular weightbearing physical activity is recommended.  4. Class 1 obesity due to excess calories without serious comorbidity with body mass index (BMI) of 31.0 to 31.9 in adult Recommend to continue on her lower calorie/carb diet such as [employer].  Aerobic physical activities to continue at 5 times a week with light weight lifting every 2 days.  Others: - raloxifene (EVISTA) 60 MG tablet; Take 1 tablet (60 mg total) by mouth daily.  Suzanne Spencer, it was a pleasure seeing you today!

## 2019-03-09 NOTE — Progress Notes (Signed)
Tovah Slavick 1950/05/25 [id number]   History:    69 y.o. G1P1L1 Married  RP:  Established patient presenting for annual gyn exam   HPI:  Menopause, well on no HRT.  No PMB.  No pelvic pain.  No pain with IC.  Breasts normal.  Urine/BMs normal.  BMI back up to 36.13 last year, now 31.67.  Health labs with Fam MD. On Evista.   Past medical history,surgical history, family history and social history were all reviewed and documented in the EPIC chart.  Gynecologic History No LMP recorded. Patient is postmenopausal.  Obstetric History OB History  Gravida Para Term Preterm AB Living  1 1 1     1   SAB TAB Ectopic Multiple Live Births               # Outcome Date GA Lbr Len/2nd Weight Sex Delivery Anes PTL Lv  1 Term              ROS: A ROS was performed and pertinent positives and negatives are included in the history.  GENERAL: No fevers or chills. HEENT: No change in vision, no earache, sore throat or sinus congestion. NECK: No pain or stiffness. CARDIOVASCULAR: No chest pain or pressure. No palpitations. PULMONARY: No shortness of breath, cough or wheeze. GASTROINTESTINAL: No abdominal pain, nausea, vomiting or diarrhea, melena or bright red blood per rectum. GENITOURINARY: No urinary frequency, urgency, hesitancy or dysuria. MUSCULOSKELETAL: No joint or muscle pain, no back pain, no recent trauma. DERMATOLOGIC: No rash, no itching, no lesions. ENDOCRINE: No polyuria, polydipsia, no heat or cold intolerance. No recent change in weight. HEMATOLOGICAL: No anemia or easy bruising or bleeding. NEUROLOGIC: No headache, seizures, numbness, tingling or weakness. PSYCHIATRIC: No depression, no loss of interest in normal activity or change in sleep pattern.     Exam:   BP 120/70   Ht 5' 1.25" (1.556 m)   Wt 169 lb (76.7 kg)   BMI 31.67 kg/m   Body mass index is 31.67 kg/m.  General appearance : Well developed well nourished female. No acute distress HEENT: Eyes: no retinal  hemorrhage or exudates,  Neck supple, trachea midline, no carotid bruits, no thyroidmegaly Lungs: Clear to auscultation, no rhonchi or wheezes, or rib retractions  Heart: Regular rate and rhythm, no murmurs or gallops Breast:Examined in sitting and supine position were symmetrical in appearance, no palpable masses or tenderness,  no skin retraction, no nipple inversion, no nipple discharge, no skin discoloration, no axillary or supraclavicular lymphadenopathy Abdomen: no palpable masses or tenderness, no rebound or guarding Extremities: no edema or skin discoloration or tenderness  Pelvic: Vulva: Normal             Vagina: No gross lesions or discharge  Cervix: No gross lesions or discharge  Uterus  AV, normal size, shape and consistency, non-tender and mobile  Adnexa  Without masses or tenderness  Anus: Normal   Assessment/Plan:  69 y.o. female for annual exam   1. Well female exam with routine gynecological exam Normal gynecologic exam in menopause.  Pap test - January 2020, will repeat at 3 years.  Breast exam normal.  Screening mammogram August 2020 was benign.  Colonoscopy in 2015.  Health labs with family physician.    2. Postmenopausal Well on no hormone replacement therapy.  No postmenopausal bleeding.  3. Osteopenia of multiple sites Bone density January 2020 osteopenia with lowest site at the left femoral neck with a T score of -1.7.  Well on  raloxifene.  No contraindication to continue.  Prescription sent to pharmacy.  We will repeat a bone density next year at 2 years.  Vitamin D supplements, calcium intake of 1200 mg daily and regular weightbearing physical activity is recommended.  4. Class 1 obesity due to excess calories without serious comorbidity with body mass index (BMI) of 31.0 to 31.9 in adult Recommend to continue on her lower calorie/carb diet such as [employer].  Aerobic physical activities to continue at 5 times a week with light weight lifting every 2  days.  Others: - raloxifene (EVISTA) 60 MG tablet; Take 1 tablet (60 mg total) by mouth daily.  Princess Bruins MD, 12:26 PM 03/09/2019

## 2019-03-16 ENCOUNTER — Encounter: Payer: Self-pay | Admitting: Family Medicine

## 2019-09-04 IMAGING — DX DG RIBS 2V*R*
2 series · 2 of 2 positions shown · non-contrast
Comparison: None in PACs

CLINICAL DATA: Patient reports falling 5 days ago with persistent
anterior right chest discomfort near the inframammary fold. The
patient is unable to sleep on that side.

EXAM:
RIGHT RIBS - 2 VIEW

[rib ap]
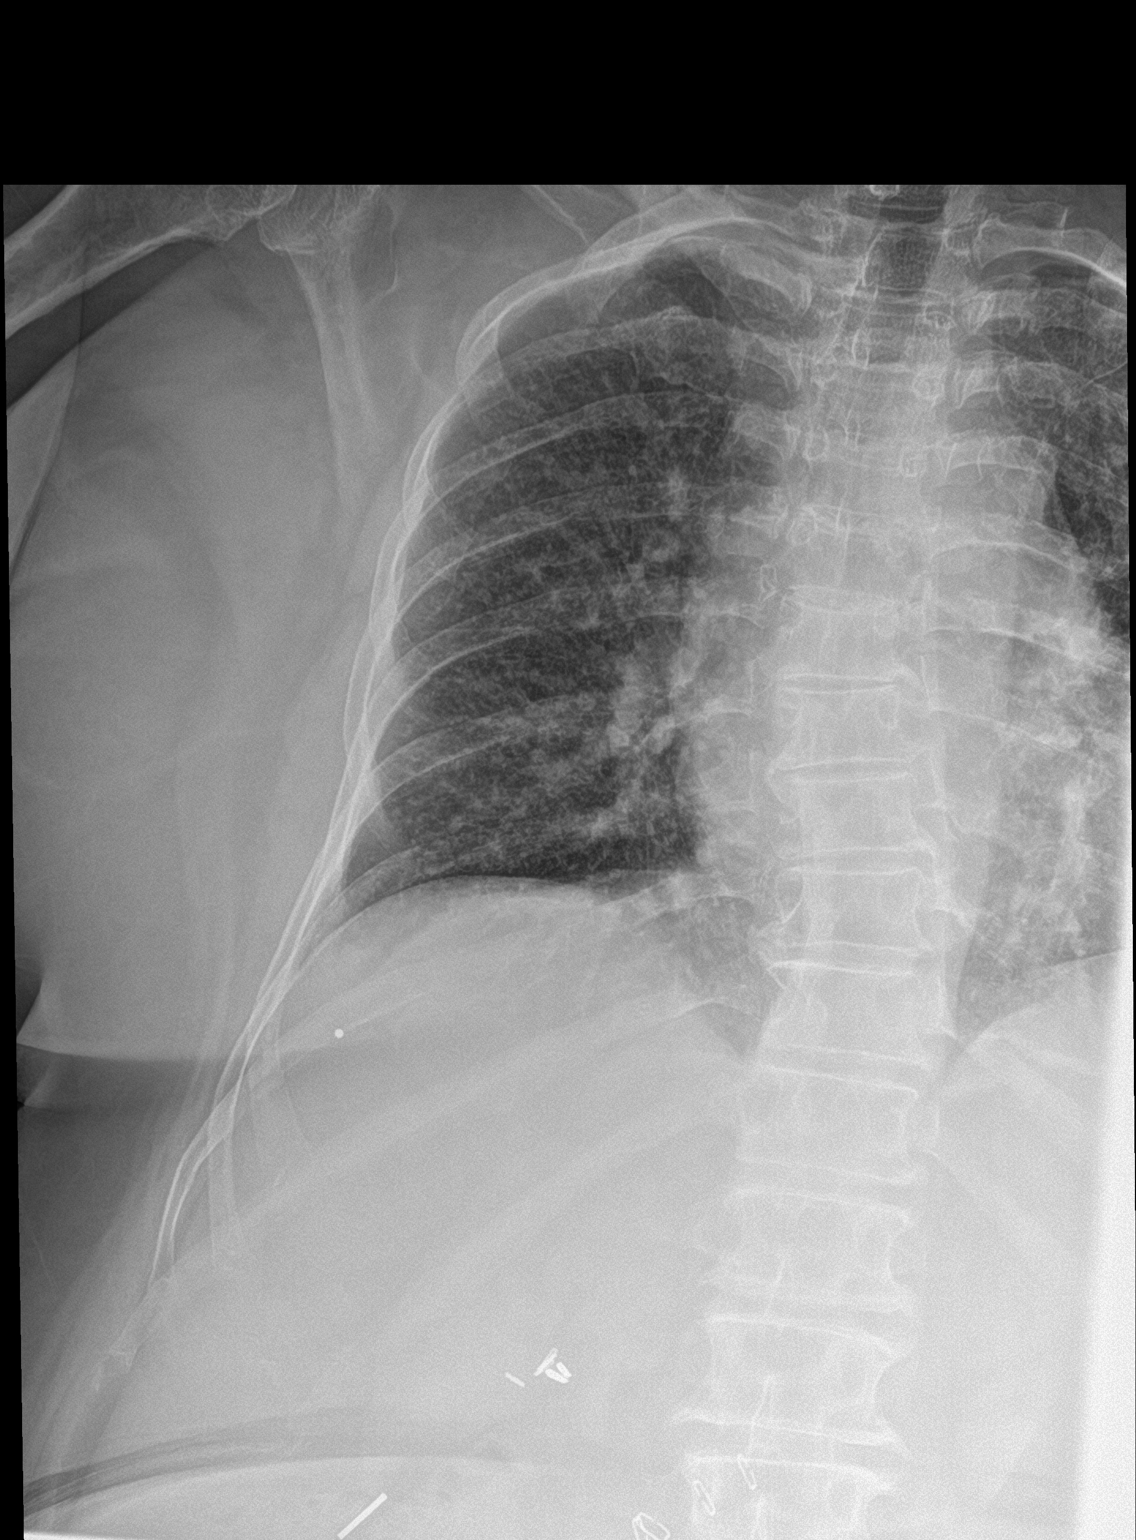

[rib ap obl]
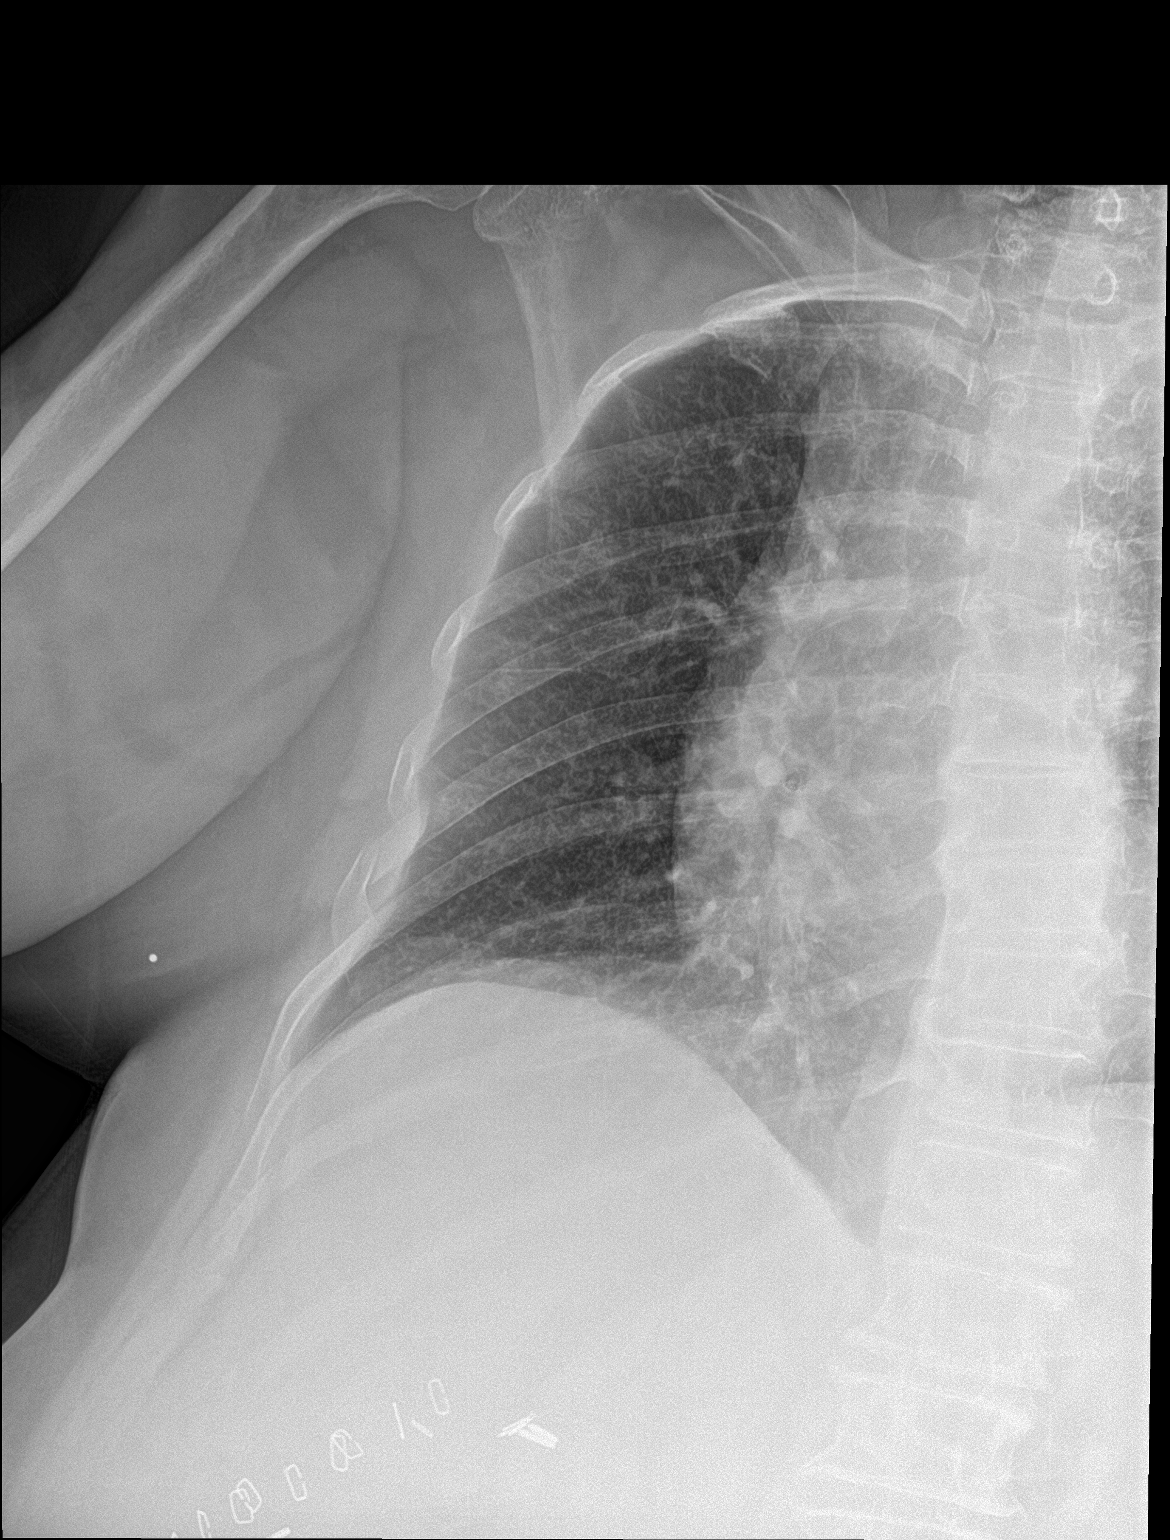

[2 of 2 positions shown; findings below may reference images not displayed]

FINDINGS: The patient has sustained an acute fracture of the anterolateral
aspect of the right sixth rib. There is no pleural effusion or
pneumothorax. The interstitial markings of the right lung are
coarse.
IMPRESSION: There is an acute mildly displaced fracture of the anterolateral
aspect of the right sixth rib.

## 2019-10-14 ENCOUNTER — Encounter: Payer: Self-pay | Admitting: Family Medicine

## 2019-10-15 ENCOUNTER — Telehealth: Payer: Self-pay

## 2019-10-15 ENCOUNTER — Other Ambulatory Visit: Payer: Self-pay

## 2019-10-15 NOTE — Telephone Encounter (Signed)
Received recent mammogram results, HM updated. Placed on PCP desk to review.

## 2019-11-06 ENCOUNTER — Telehealth: Payer: Self-pay | Admitting: Family Medicine

## 2019-11-06 NOTE — Telephone Encounter (Signed)
Left message for patient to schedule Annual Wellness Visit.  Please schedule with Nurse Health Advisor Martha Stanley, RN at [facility]  °

## 2019-12-19 IMAGING — DX DG SHOULDER 2+V*L*
2 series · 2 of 2 positions shown · non-contrast
Comparison: None.

CLINICAL DATA: Left shoulder pain after fall last night.

EXAM:
LEFT SHOULDER - 2+ VIEW

[grashey]
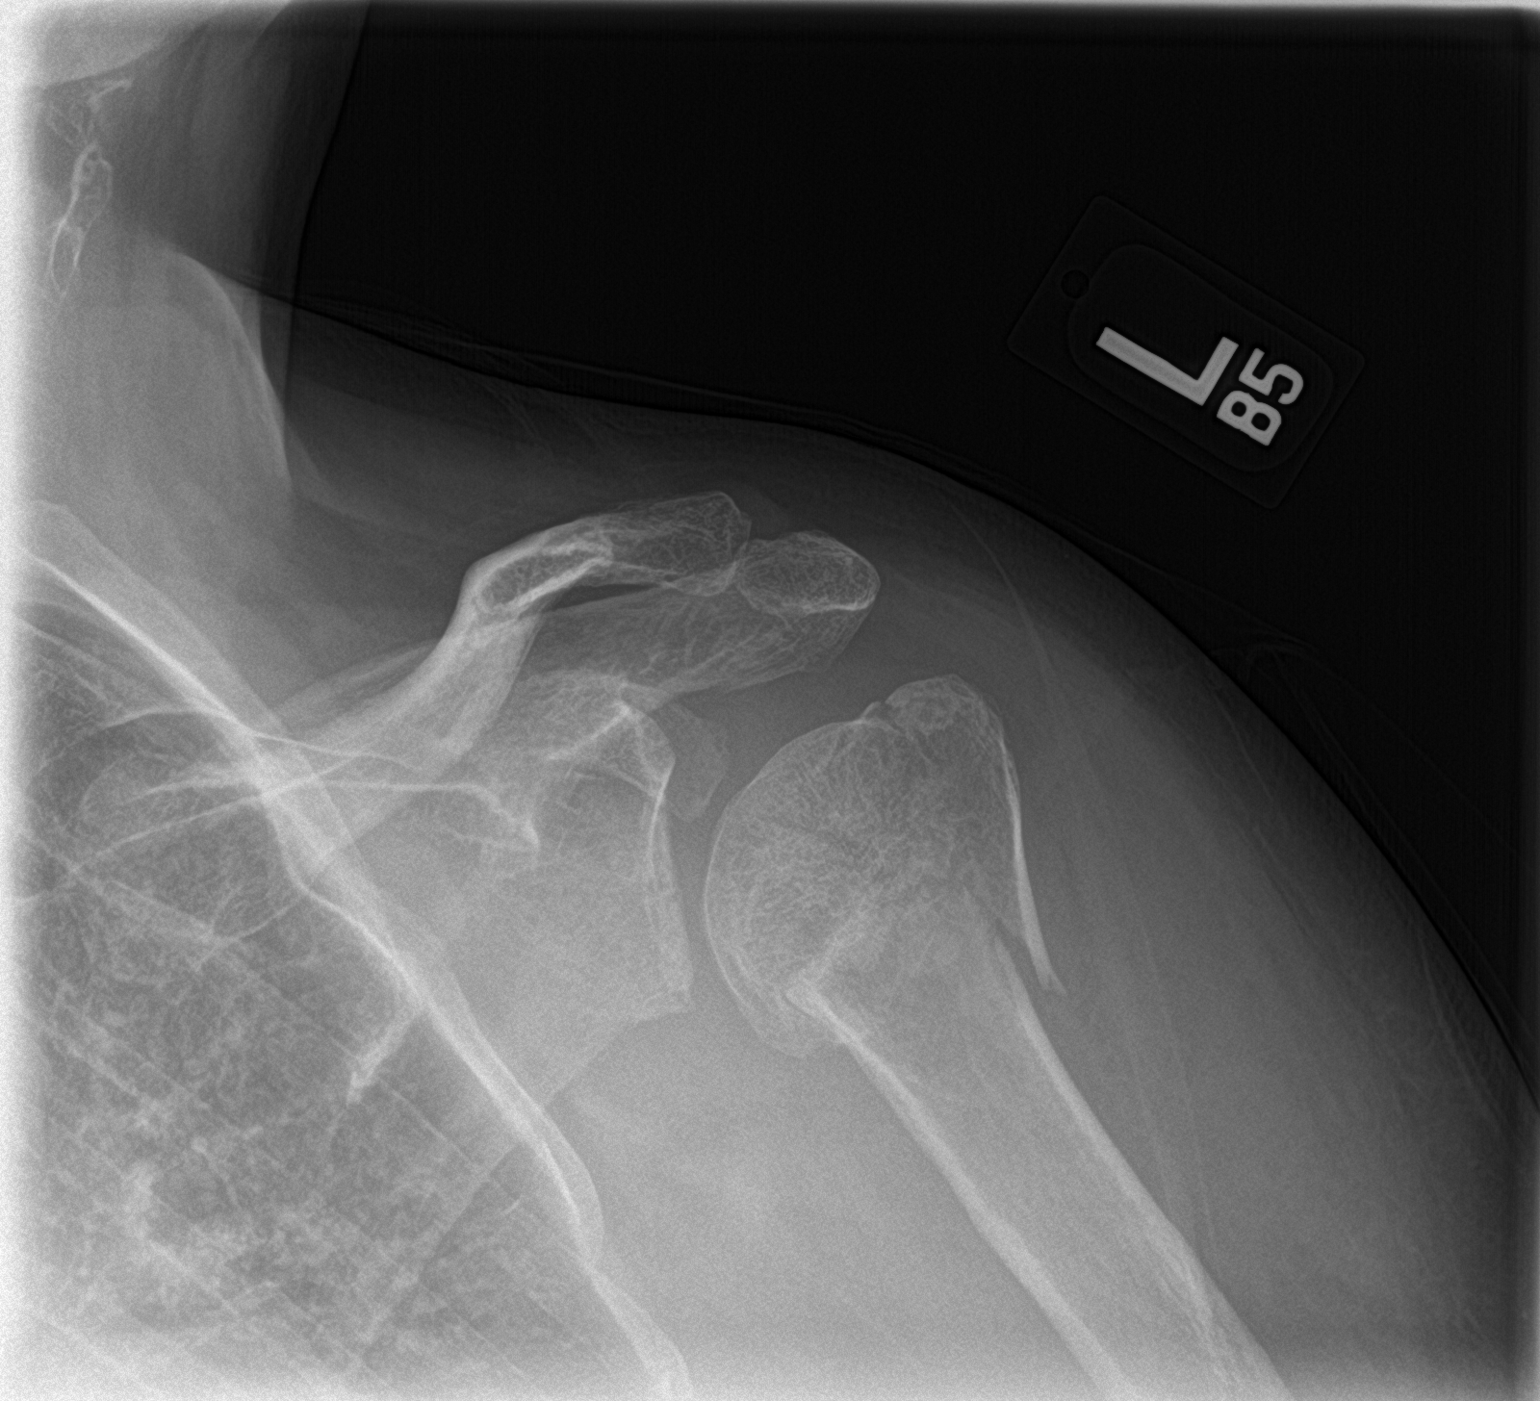

[y view]
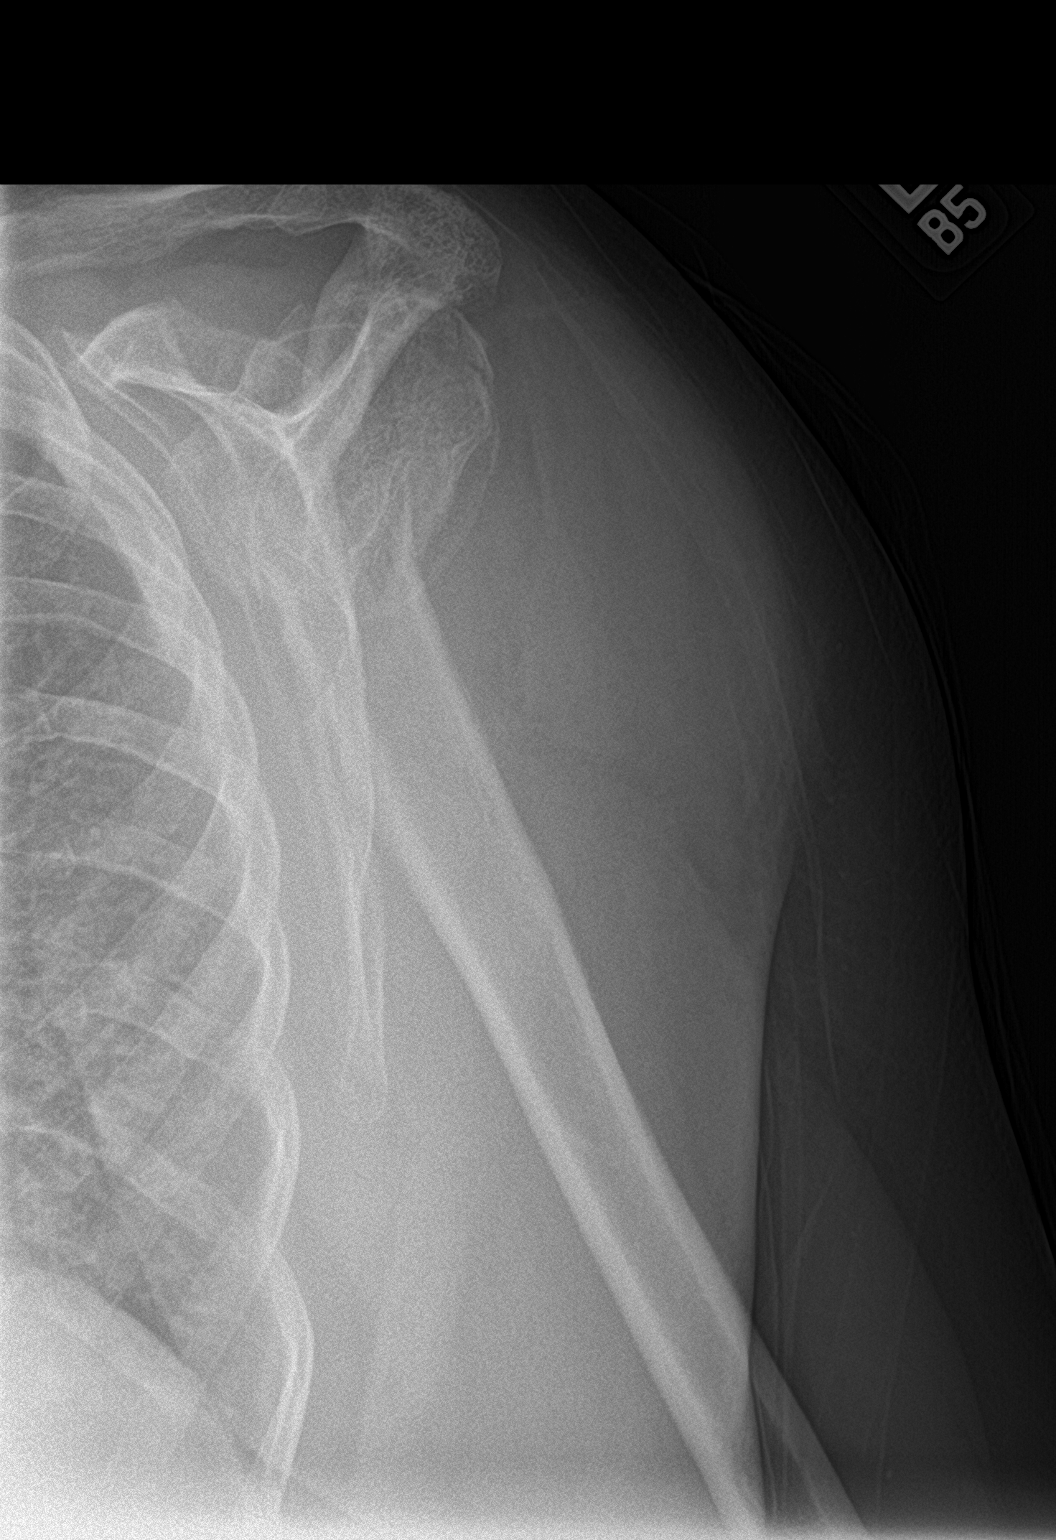

[2 of 2 positions shown; findings below may reference images not displayed]

FINDINGS: Mildly displaced and comminuted fracture is seen involving the
proximal left humeral head and neck. No other abnormality is noted.
Visualized ribs are unremarkable.
IMPRESSION: Mildly displaced and comminuted proximal left humeral head and neck
fracture.

## 2019-12-19 IMAGING — DX DG HUMERUS 2V *L*
2 series · 2 of 2 positions shown · non-contrast
Comparison: None.

CLINICAL DATA: Left shoulder pain after fall last night.

EXAM:
LEFT HUMERUS - 2+ VIEW

[humerus ap]
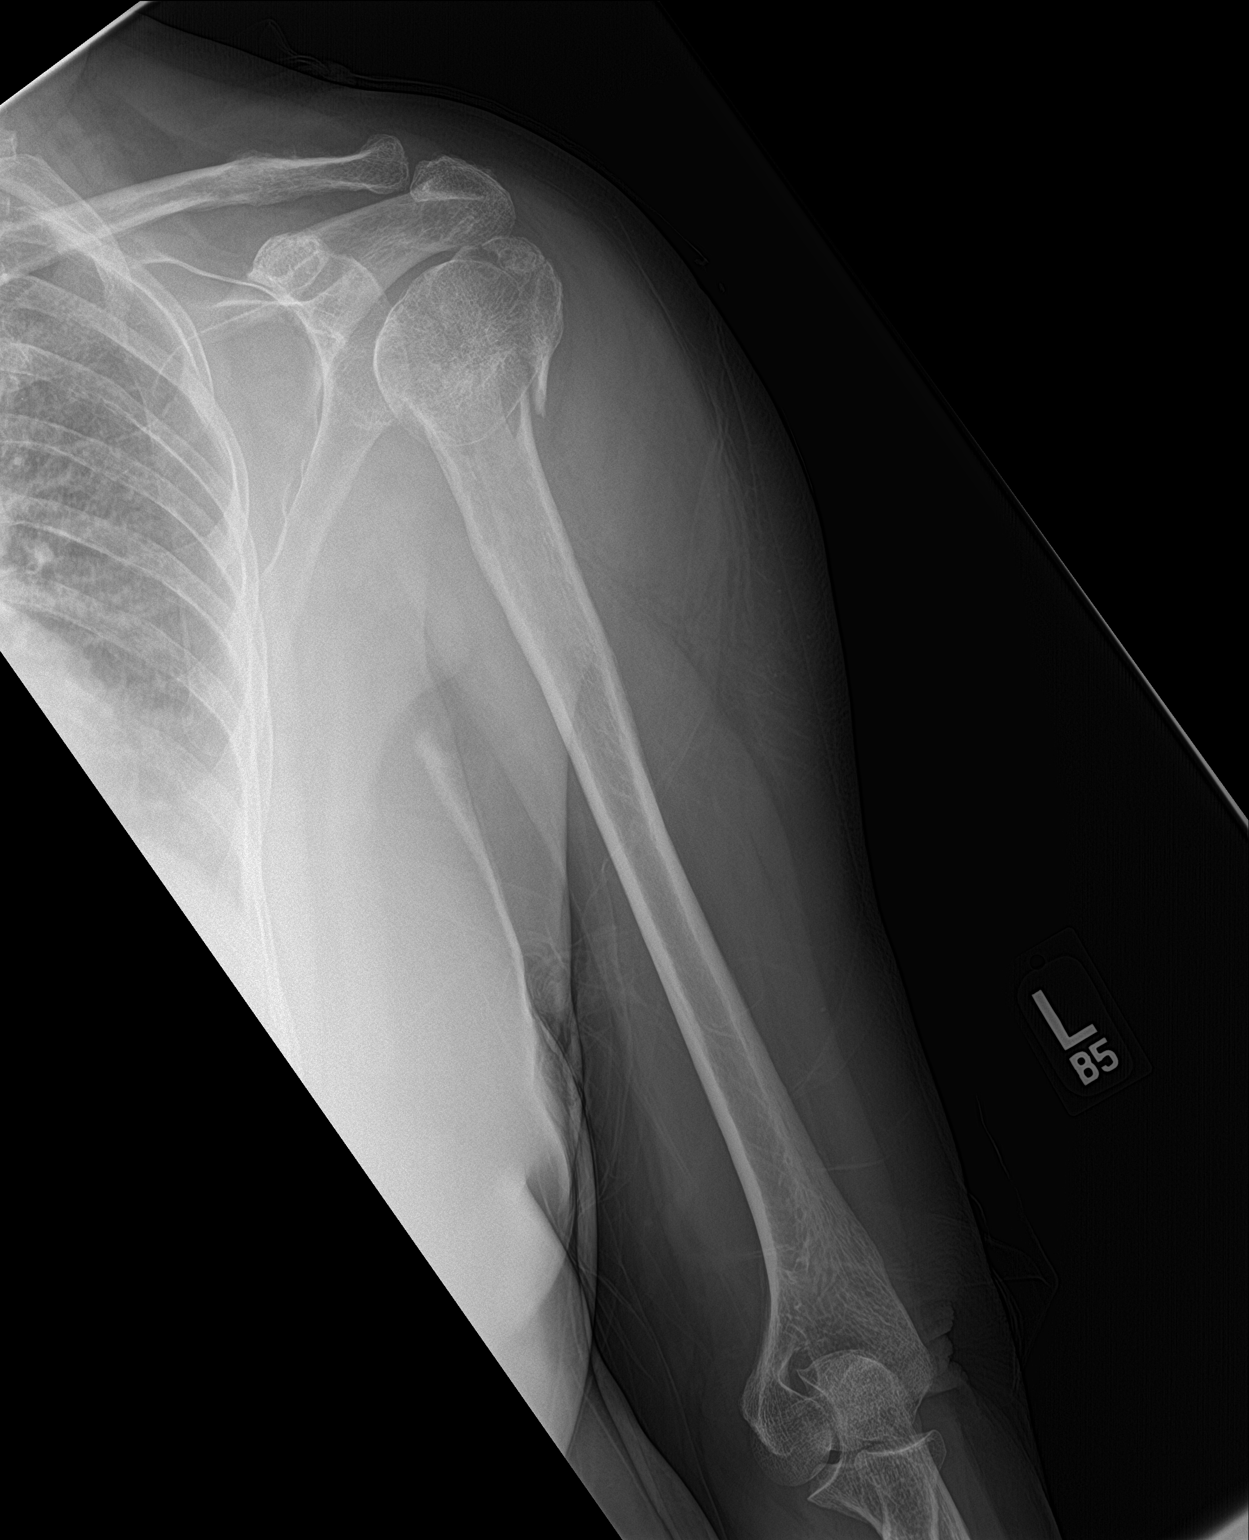

[humerus lat]
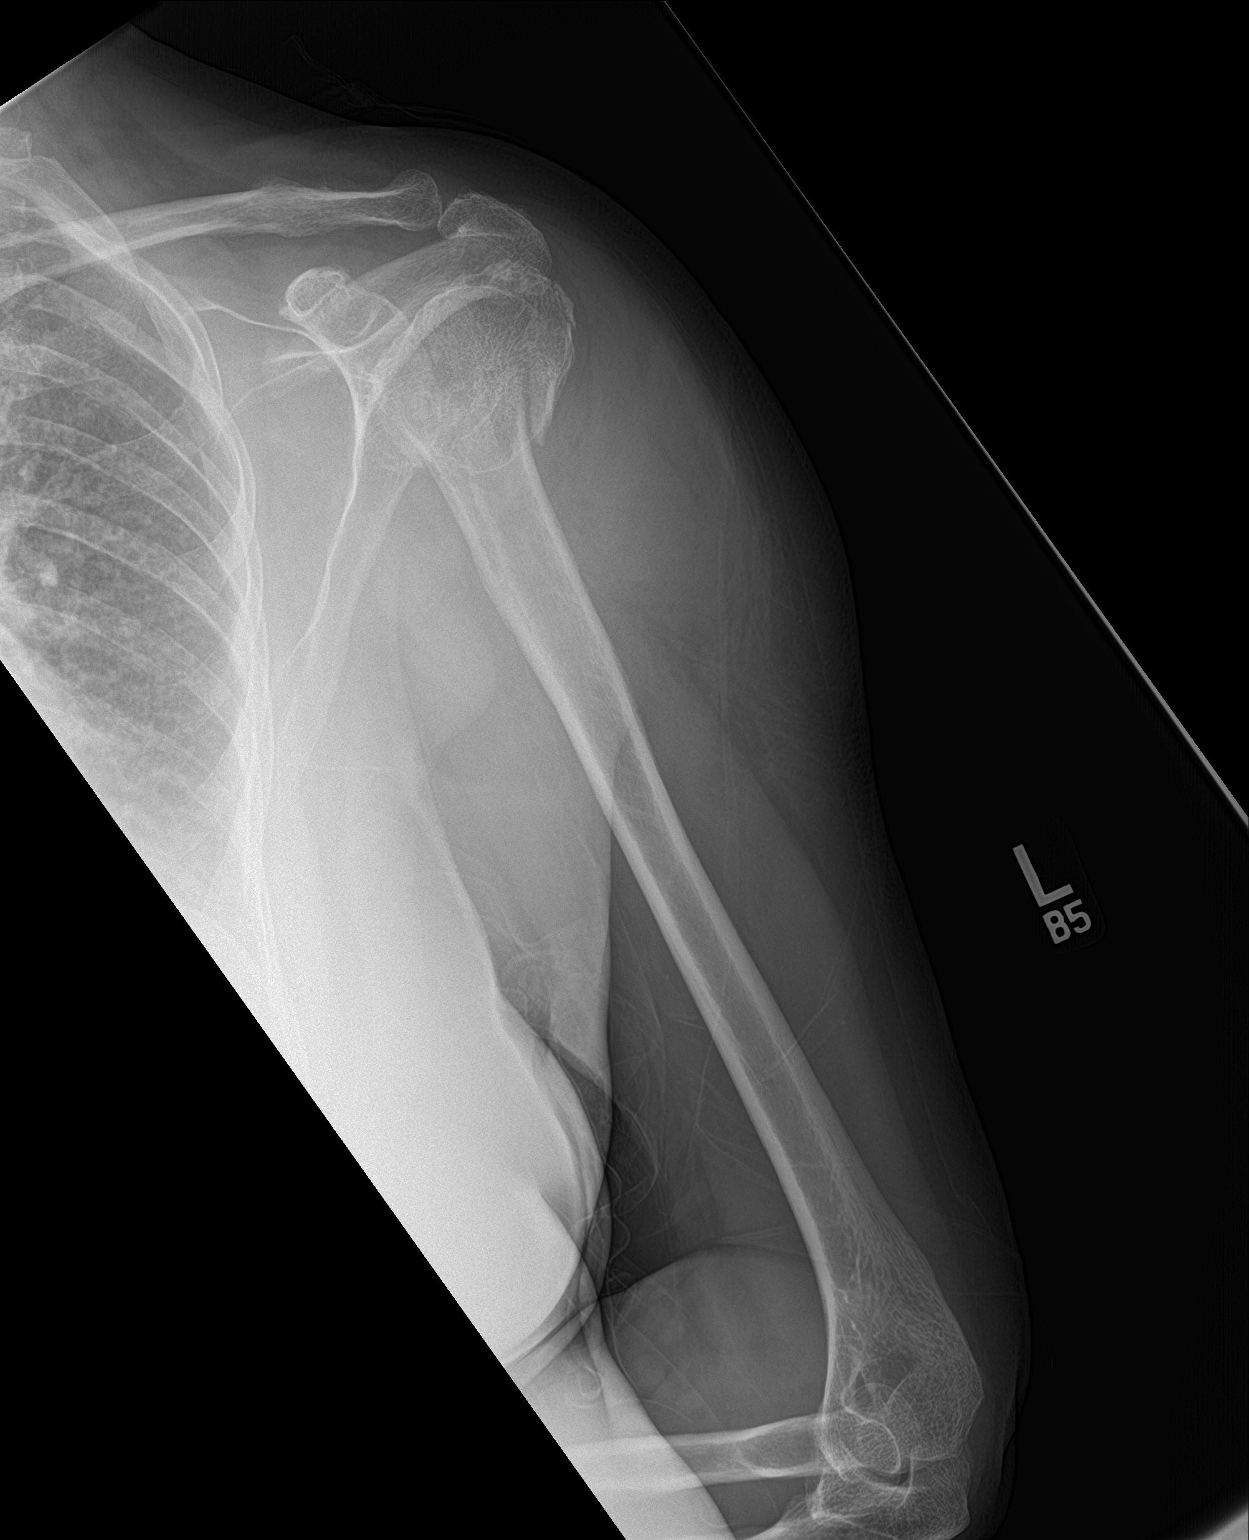

[2 of 2 positions shown; findings below may reference images not displayed]

FINDINGS: Mildly displaced and comminuted fracture is seen involving the left
humeral head and neck. No soft tissue abnormality is noted.
IMPRESSION: Mildly displaced and comminuted proximal left humeral head and neck
fracture.

## 2019-12-31 ENCOUNTER — Encounter: Payer: Medicare Other | Admitting: Family Medicine

## 2020-02-09 ENCOUNTER — Encounter: Payer: Self-pay | Admitting: Family Medicine

## 2020-02-09 ENCOUNTER — Ambulatory Visit (INDEPENDENT_AMBULATORY_CARE_PROVIDER_SITE_OTHER): Payer: Medicare Other | Admitting: Family Medicine

## 2020-02-09 ENCOUNTER — Other Ambulatory Visit: Payer: Self-pay

## 2020-02-09 VITALS — BP 92/57 | HR 70 | Temp 98.3°F | Resp 16 | Ht 61.0 in | Wt 162.2 lb

## 2020-02-09 DIAGNOSIS — Z0000 Encounter for general adult medical examination without abnormal findings: Secondary | ICD-10-CM

## 2020-02-09 DIAGNOSIS — R7303 Prediabetes: Secondary | ICD-10-CM | POA: Diagnosis not present

## 2020-02-09 DIAGNOSIS — E7800 Pure hypercholesterolemia, unspecified: Secondary | ICD-10-CM

## 2020-02-09 DIAGNOSIS — R031 Nonspecific low blood-pressure reading: Secondary | ICD-10-CM

## 2020-02-09 LAB — COMPREHENSIVE METABOLIC PANEL
ALT: 14 U/L (ref 0–35)
AST: 16 U/L (ref 0–37)
Albumin: 4 g/dL (ref 3.5–5.2)
Alkaline Phosphatase: 77 U/L (ref 39–117)
BUN: 20 mg/dL (ref 6–23)
CO2: 30 mEq/L (ref 19–32)
Calcium: 9.1 mg/dL (ref 8.4–10.5)
Chloride: 107 mEq/L (ref 96–112)
Creatinine, Ser: 0.79 mg/dL (ref 0.40–1.20)
GFR: 75.87 mL/min (ref >60.00)
Glucose, Bld: 89 mg/dL (ref 70–99)
Potassium: 4.2 mEq/L (ref 3.5–5.1)
Sodium: 142 mEq/L (ref 135–145)
Total Bilirubin: 0.4 mg/dL (ref 0.2–1.2)
Total Protein: 6.3 g/dL (ref 6.0–8.3)

## 2020-02-09 LAB — CBC WITH DIFFERENTIAL/PLATELET
Basophils Absolute: 0.1 10*3/uL (ref 0.0–0.1)
Basophils Relative: 1.1 % (ref 0.0–3.0)
Eosinophils Absolute: 0.2 10*3/uL (ref 0.0–0.7)
Eosinophils Relative: 3 % (ref 0.0–5.0)
HCT: 37.4 % (ref 36.0–46.0)
Hemoglobin: 12.4 g/dL (ref 12.0–15.0)
Lymphocytes Relative: 37.1 % (ref 12.0–46.0)
Lymphs Abs: 2 10*3/uL (ref 0.7–4.0)
MCHC: 33.3 g/dL (ref 30.0–36.0)
MCV: 88.8 fl (ref 78.0–100.0)
Monocytes Absolute: 0.4 10*3/uL (ref 0.1–1.0)
Monocytes Relative: 7.6 % (ref 3.0–12.0)
Neutro Abs: 2.7 10*3/uL (ref 1.4–7.7)
Neutrophils Relative %: 51.2 % (ref 43.0–77.0)
Platelets: 131 10*3/uL — ABNORMAL LOW (ref 150.0–400.0)
RBC: 4.21 Mil/uL (ref 3.87–5.11)
RDW: 13.5 % (ref 11.5–15.5)
WBC: 5.3 10*3/uL (ref 4.0–10.5)

## 2020-02-09 LAB — LIPID PANEL
Cholesterol: 190 mg/dL (ref 0–200)
HDL: 77.7 mg/dL (ref >39.00)
LDL Cholesterol: 100 mg/dL — ABNORMAL HIGH (ref 0–99)
NonHDL: 112.66
Total CHOL/HDL Ratio: 2
Triglycerides: 62 mg/dL (ref 0.0–149.0)
VLDL: 12.4 mg/dL (ref 0.0–40.0)

## 2020-02-09 LAB — HEMOGLOBIN A1C: Hgb A1c MFr Bld: 5.5 % (ref 4.6–6.5)

## 2020-02-09 LAB — TSH: TSH: 1.74 u[IU]/mL (ref 0.35–4.50)

## 2020-02-09 NOTE — Patient Instructions (Signed)
Health Maintenance, Female Adopting a healthy lifestyle and getting preventive care are important in promoting health and wellness. Ask your health care provider about:  The right schedule for you to have regular tests and exams.  Things you can do on your own to prevent diseases and keep yourself healthy. What should I know about diet, weight, and exercise? Eat a healthy diet   Eat a diet that includes plenty of vegetables, fruits, low-fat dairy products, and lean protein.  Do not eat a lot of foods that are high in solid fats, added sugars, or sodium. Maintain a healthy weight Body mass index (BMI) is used to identify weight problems. It estimates body fat based on height and weight. Your health care provider can help determine your BMI and help you achieve or maintain a healthy weight. Get regular exercise Get regular exercise. This is one of the most important things you can do for your health. Most adults should:  Exercise for at least 150 minutes each week. The exercise should increase your heart rate and make you sweat (moderate-intensity exercise).  Do strengthening exercises at least twice a week. This is in addition to the moderate-intensity exercise.  Spend less time sitting. Even light physical activity can be beneficial. Watch cholesterol and blood lipids Have your blood tested for lipids and cholesterol at 70 years of age, then have this test every 5 years. Have your cholesterol levels checked more often if:  Your lipid or cholesterol levels are high.  You are older than 70 years of age.  You are at high risk for heart disease. What should I know about cancer screening? Depending on your health history and family history, you may need to have cancer screening at various ages. This may include screening for:  Breast cancer.  Cervical cancer.  Colorectal cancer.  Skin cancer.  Lung cancer. What should I know about heart disease, diabetes, and high blood  pressure? Blood pressure and heart disease  High blood pressure causes heart disease and increases the risk of stroke. This is more likely to develop in people who have high blood pressure readings, are of African descent, or are overweight.  Have your blood pressure checked: ? Every 3-5 years if you are 18-39 years of age. ? Every year if you are 40 years old or older. Diabetes Have regular diabetes screenings. This checks your fasting blood sugar level. Have the screening done:  Once every three years after age 40 if you are at a normal weight and have a low risk for diabetes.  More often and at a younger age if you are overweight or have a high risk for diabetes. What should I know about preventing infection? Hepatitis B If you have a higher risk for hepatitis B, you should be screened for this virus. Talk with your health care provider to find out if you are at risk for hepatitis B infection. Hepatitis C Testing is recommended for:  Everyone born from 1945 through 1965.  Anyone with known risk factors for hepatitis C. Sexually transmitted infections (STIs)  Get screened for STIs, including gonorrhea and chlamydia, if: ? You are sexually active and are younger than 70 years of age. ? You are older than 70 years of age and your health care provider tells you that you are at risk for this type of infection. ? Your sexual activity has changed since you were last screened, and you are at increased risk for chlamydia or gonorrhea. Ask your health care provider if   you are at risk.  Ask your health care provider about whether you are at high risk for HIV. Your health care provider may recommend a prescription medicine to help prevent HIV infection. If you choose to take medicine to prevent HIV, you should first get tested for HIV. You should then be tested every 3 months for as long as you are taking the medicine. Pregnancy  If you are about to stop having your period (premenopausal) and  you may become pregnant, seek counseling before you get pregnant.  Take 400 to 800 micrograms (mcg) of folic acid every day if you become pregnant.  Ask for birth control (contraception) if you want to prevent pregnancy. Osteoporosis and menopause Osteoporosis is a disease in which the bones lose minerals and strength with aging. This can result in bone fractures. If you are 65 years old or older, or if you are at risk for osteoporosis and fractures, ask your health care provider if you should:  Be screened for bone loss.  Take a calcium or vitamin D supplement to lower your risk of fractures.  Be given hormone replacement therapy (HRT) to treat symptoms of menopause. Follow these instructions at home: Lifestyle  Do not use any products that contain nicotine or tobacco, such as cigarettes, e-cigarettes, and chewing tobacco. If you need help quitting, ask your health care provider.  Do not use street drugs.  Do not share needles.  Ask your health care provider for help if you need support or information about quitting drugs. Alcohol use  Do not drink alcohol if: ? Your health care provider tells you not to drink. ? You are pregnant, may be pregnant, or are planning to become pregnant.  If you drink alcohol: ? Limit how much you use to 0-1 drink a day. ? Limit intake if you are breastfeeding.  Be aware of how much alcohol is in your drink. In the [place] one drink equals one 12 oz bottle of beer (355 mL), one 5 oz glass of wine (148 mL), or one 1 oz glass of hard liquor (44 mL). General instructions  Schedule regular health, dental, and eye exams.  Stay current with your vaccines.  Tell your health care provider if: ? You often feel depressed. ? You have ever been abused or do not feel safe at home. Summary  Adopting a healthy lifestyle and getting preventive care are important in promoting health and wellness.  Follow your health care provider's instructions about healthy  diet, exercising, and getting tested or screened for diseases.  Follow your health care provider's instructions on monitoring your cholesterol and blood pressure. This information is not intended to replace advice given to you by your health care provider. Make sure you discuss any questions you have with your health care provider. Document Revised: 01/22/2018 Document Reviewed: 01/22/2018 Elsevier Patient Education  2020 Elsevier Inc[employer]  

## 2020-02-09 NOTE — Progress Notes (Signed)
Office Note 02/09/2020  CC:  Chief Complaint  Patient presents with  . Annual Exam    Pt is fasting    HPI:  Suzanne Spencer is a 69 y.o. female who is here for annual health maintenance exam and f/u GERD. A/P as of last visit: "Health maintenance exam: Reviewed age and gender appropriate health maintenance issues (prudent diet, regular exercise, health risks of tobacco and excessive alcohol, use of seatbelts, fire alarms in home, use of sunscreen).  Also reviewed age and gender appropriate health screening as well as vaccine recommendations. Vaccines: ALL UTD. Labs: none indicated today: all good 06/2018. Cervical ca screening: GYN->Dr. Seymour Bars.   Breast ca screening: repeat mammogram due 09/2019. Colon ca screening: next colonoscopy 03/2023.  GERD: I encouraged her to try cutting back to qd omeprazole to see how things go. She said she'd try. She'll start taking otc vit 12 200 mcg tab qd. Consider b12 level check at next f/u 1 yr."  INTERIM HX:  Doing pretty good.  Struggling with some recent family stress-->new granddaughter with chronic dz issue.  Dealing with stress/anx/more HAs.  Rides stationary bike, stays active.  GERD: takes omeprazole once a day now and this controls her reflux well. Rare need for bid dosing.  She does take otc b12 supplement.  Her anx and dep are managed by her psychiatrist, Dr. Evelene Croon. Since I last saw pt she has been switched to lexapro and ativan (from trintellix and tranxene).  Past Medical History:  Diagnosis Date  . Acute parotitis 11/2015   hx of:  sialolithiasis suspected by ENT--spont resolved.  . Allergy    SEASONAL  . Anxiety   . Atrophic vaginitis   . Cervical cancer screening    PAP normal 2017: GYN to repeat 2020.  Marland Kitchen Chronic headaches    tension, mirgraines  . Depression   . Diverticulosis   . Duodenal ulcer 1989  . Dysplasia of cervix, low grade (CIN 1)    S/P LEEP  . Endometrial hyperplasia    SIMPLE  . GERD  (gastroesophageal reflux disease)   . 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  Much improved at Dr. Michaelle Copas f/u 10/2015.  Marland Kitchen Hiatal hernia   . Humerus fracture 12/19/2017   LEFT: mildly displaced comminuted fracture of the proximal left humerus.  . Hyperlipidemia    LDL goal = < 140.  No meds as of 04/2016.  . Iron deficiency anemia Fall 2019   Celiac screen NEG, hemoccults neg.  Pt to get EGD and TCS 12/19/17.  Endoscopies cancelled b/c pt had horrible time w/prep and fell and broke humerus when trying to reach her bathroom quickly--pt declines endosc as of 06/2018.  Labs stable/normal at that time.  . Medial meniscus tear 04/2014   R knee  . Osteoarthritis    KNEES  . Osteoporosis    GYN managing; as of 01/2016 pt is on Evista.  She is intol of oral bisphosph due to GERD.  . Other and unspecified ovarian cysts   . Prediabetes 2018   A1c 6% 04/2016  . Proximal humerus fracture 12/19/2017   Mildly displaced and comminuted proximal left humeral head and neck fracture.  . Rib fracture 09/04/2017   Mildly displaced R 6th rib fx    Past Surgical History:  Procedure Laterality Date  . ABDOMINAL SURGERY  2003    EXPLORATORY LAPAROTOMY, LSO  . ADENOIDECTOMY  1954  . CARPAL TUNNEL RELEASE Bilateral 2001  . CHOLECYSTECTOMY  1989   stones  .  COLONOSCOPY  2011; 03/2013   2011 NORMAL.  03/2013 TCS done for heme+ stool: no polyps.  Repeat 10 yrs (after 03/2023).  Marland Kitchen DEXA  03/23/2014; 2020   Osteopenia.  Pt took prolia x 2 injections, then was switched to evista by her GYN.  DEXA 01/17/16 T score a bit worse, same regimen rec'd, as of 02/2019 doing well on raloxifene, rpt DEXA planned in 1 yr. (by GYN)  . ENDOMETRIAL BIOPSY  09/27/2015   PATH:  BENIGN  . endoscopy upper GI  2000; 05/2014   negative 2000.  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 Russella Dar is PPI bid long term.  Marland Kitchen HYSTEROSCOPY     UTERINE POLYPS  . MOUTH  SURGERY    . OOPHORECTOMY  2003   LSO WITH EXPLORATORY LAPAROTOMY  . TUBAL LIGATION  1987    Family History  Problem Relation Age of Onset  . Heart attack Father        abnormal EKG; ? AVR  . Diabetes Father   . Heart disease Father   . Hyperlipidemia Mother   . Anxiety disorder Mother   . Depression Mother   . Irregular heart beat Sister   . Heart disease Maternal Grandfather   . Heart failure Maternal Grandfather   . Osteoporosis Maternal Grandmother   . Colon cancer Neg Hx     Social History   Socioeconomic History  . Marital status: Married    Spouse name: Not on file  . Number of children: 1  . Years of education: Not on file  . Highest education level: Not on file  Occupational History  . Occupation: retired    Associate Professor: NOT EMPLOYED  Tobacco Use  . Smoking status: Never Smoker  . Smokeless tobacco: Never Used  Vaping Use  . Vaping Use: Never used  Substance and Sexual Activity  . Alcohol use: Yes    Alcohol/week: 1.0 standard drink    Types: 1 Glasses of wine per week    Comment: Rarely  . Drug use: No  . Sexual activity: Yes    Birth control/protection: Surgical  Other Topics Concern  . Not on file  Social History Narrative   Married, one son.   Orig from [place] area, lives in [place].   Occupation: retired from [employer]).   No tob, rare, alcohol, no drugs.   Exercise: stationary bike 6 days a week.  [facility]   Social Determinants of Health   Financial Resource Strain: Not on file  Food Insecurity: Not on file  Transportation Needs: Not on file  Physical Activity: Not on file  Stress: Not on file  Social Connections: Not on file  Intimate Partner Violence: Not on file    Outpatient Medications Prior to Visit  Medication Sig Dispense Refill  . Calcium Carbonate-Vitamin D 600-400 MG-UNIT per tablet Take 1 tablet by mouth daily.    Marland Kitchen escitalopram (LEXAPRO) 20 MG tablet Take 20 mg by mouth daily.     Marland Kitchen LORazepam (ATIVAN) 1 MG tablet Take 1 tablet by mouth 2 (two) times daily as needed.    . Magnesium 500 MG CAPS Take 1 capsule by mouth daily.    . Multiple Vitamin (MULTIVITAMIN) tablet Take 1 tablet by mouth daily.    Marland Kitchen omeprazole (PRILOSEC) 40 MG capsule TAKE 1 CAPSULE BY MOUTH TWICE A DAY 180 capsule 3  .  raloxifene (EVISTA) 60 MG tablet Take 1 tablet (60 mg total) by mouth daily. 90 tablet 4  . TURMERIC PO Take 1 capsule by mouth daily.    . clorazepate (TRANXENE) 7.5 MG tablet Take 7.5 mg by mouth once as needed.   (Patient not taking: Reported on 02/09/2020)    . TRINTELLIX 20 MG TABS tablet Take 20 mg by mouth daily. (Patient not taking: Reported on 02/09/2020)     No facility-administered medications prior to visit.    No Known Allergies  ROS Review of Systems  Constitutional: Negative for appetite change, chills, fatigue and fever.  HENT: Negative for congestion, dental problem, ear pain and sore throat.   Eyes: Negative for discharge, redness and visual disturbance.  Respiratory: Negative for cough, chest tightness, shortness of breath and wheezing.   Cardiovascular: Negative for chest pain, palpitations and leg swelling.  Gastrointestinal: Negative for abdominal pain, blood in stool, diarrhea, nausea and vomiting.  Genitourinary: Negative for difficulty urinating, dysuria, flank pain, frequency, hematuria and urgency.  Musculoskeletal: Negative for arthralgias, back pain, joint swelling, myalgias and neck stiffness.  Skin: Negative for pallor and rash.  Neurological: Negative for dizziness, speech difficulty, weakness and headaches.  Hematological: Negative for adenopathy. Does not bruise/bleed easily.  Psychiatric/Behavioral: Negative for confusion and sleep disturbance. The patient is not nervous/anxious.     PE; Vitals with BMI 02/09/2020 03/09/2019 12/29/2018  Height 5\' 1"  5' 1.25" 5' 1.25"  Weight 162 lbs 3 oz 169 lbs 171 lbs 13 oz  BMI 30.66 31.66 32.19   Systolic 92 120 120  Diastolic 57 70 74  Pulse 70 - 81     Gen: Alert, well appearing.  Patient is oriented to person, place, time, and situation. AFFECT: pleasant, lucid thought and speech. ENT: Ears: EACs clear, normal epithelium.  TMs with good light reflex and landmarks bilaterally.  Eyes: no injection, icteris, swelling, or exudate.  EOMI, PERRLA. Nose: no drainage or turbinate edema/swelling.  No injection or focal lesion.  Mouth: lips without lesion/swelling.  Oral mucosa pink and moist.  Dentition intact and without obvious caries or gingival swelling.  Oropharynx without erythema, exudate, or swelling.  Neck: supple/nontender.  No LAD, mass, or TM.  Carotid pulses 2+ bilaterally, without bruits. CV: RRR, no m/r/g.   LUNGS: CTA bilat, nonlabored resps, good aeration in all lung fields. ABD: soft, NT, ND, BS normal.  No hepatospenomegaly or mass.  No bruits. EXT: no clubbing, cyanosis, or edema.  Musculoskeletal: no joint swelling, erythema, warmth, or tenderness.  ROM of all joints intact. Skin - no sores or suspicious lesions or rashes or color changes   Pertinent labs:  Lab Results  Component Value Date   TSH 0.94 06/27/2018   Lab Results  Component Value Date   WBC 6.5 06/27/2018   HGB 12.8 06/27/2018   HCT 38.6 06/27/2018   MCV 87.1 06/27/2018   PLT 178 06/27/2018   Lab Results  Component Value Date   VITAMINB12 431 09/29/2008   Lab Results  Component Value Date   CREATININE 0.75 06/27/2018   BUN 17 06/27/2018   NA 140 06/27/2018   K 4.0 06/27/2018   CL 104 06/27/2018   CO2 29 06/27/2018   Lab Results  Component Value Date   ALT 19 06/27/2018   AST 25 06/27/2018   ALKPHOS 96 10/08/2017   BILITOT 0.3 06/27/2018   Lab Results  Component Value Date   CHOL 210 (H) 06/27/2018   Lab Results  Component Value Date  HDL 79 06/27/2018   Lab Results  Component Value Date   LDLCALC 114 (H) 06/27/2018   Lab Results  Component Value Date   TRIG 75  06/27/2018   Lab Results  Component Value Date   CHOLHDL 2.7 06/27/2018   Lab Results  Component Value Date   HGBA1C 5.4 06/27/2018   ASSESSMENT AND PLAN:   Health maintenance exam: Reviewed age and gender appropriate health maintenance issues (prudent diet, regular exercise, health risks of tobacco and excessive alcohol, use of seatbelts, fire alarms in home, use of sunscreen).  Also reviewed age and gender appropriate health screening as well as vaccine recommendations. Vaccines:  Flu->UTD.  Covid 19->pt declines.   Otherwise ALL UTD. Labs: fasting HP and Hba1c (hx of prediab and hyperlipidemia). Cervical ca screening: hx of cerv dysplasia, is s/p LEEP, GYN plan is repeat pap 2023. Breast ca screening: normal mammogram 10/14/2019.  Rpt 10/2020. Osteoporosis: managed by her GYN MD, pt intol of bisphosphonates-->evista, vit D, Ca++->next DEXA 02/2021 per GYN plan. Colon ca screening: recall 2025.  GERD: doing well on daily PPI->continue  Anx/dep: stable on lexapro and ativan per Dr. Evelene Croon.  Low blood pressure reading: she feels well, no sign of blood loss or dehydration, pt on NO antihypertensives.  Cardiac exam normal, HR normal. Reassured.  An After Visit Summary was printed and given to the patient.  FOLLOW UP:  No follow-ups on file.  Signed:  Santiago Bumpers, MD           02/09/2020

## 2020-03-09 ENCOUNTER — Other Ambulatory Visit: Payer: Self-pay

## 2020-03-09 ENCOUNTER — Ambulatory Visit (INDEPENDENT_AMBULATORY_CARE_PROVIDER_SITE_OTHER): Payer: Medicare Other | Admitting: Obstetrics & Gynecology

## 2020-03-09 ENCOUNTER — Encounter: Payer: Self-pay | Admitting: Obstetrics & Gynecology

## 2020-03-09 VITALS — BP 112/58 | HR 82 | Resp 12 | Ht 61.5 in | Wt 161.4 lb

## 2020-03-09 DIAGNOSIS — Z780 Asymptomatic menopausal state: Secondary | ICD-10-CM

## 2020-03-09 DIAGNOSIS — M8589 Other specified disorders of bone density and structure, multiple sites: Secondary | ICD-10-CM | POA: Diagnosis not present

## 2020-03-09 DIAGNOSIS — Z01419 Encounter for gynecological examination (general) (routine) without abnormal findings: Secondary | ICD-10-CM

## 2020-03-09 MED ORDER — RALOXIFENE HCL 60 MG PO TABS: 60.0000 mg | ORAL_TABLET | Freq: Every day | ORAL | 4 refills | Status: DC

## 2020-03-09 NOTE — Progress Notes (Signed)
Suzanne Spencer [birth year] [id number]   History:    70 y.o.  G1P1L1 Married  [id number] presenting for annual gyn exam   [id number], well on no HRT. No PMB. No pelvic pain. No pain with IC. Breasts normal. Urine/BMs normal. BMI decreased to 30.  Stationary bike.  Healthy nutrition. Health labs with Fam MD.  Osteopenia on last BD 02/2018.  Well on Evista.   Past medical history,surgical history, family history and social history were all reviewed and documented in the EPIC chart.  Gynecologic History No LMP recorded. Patient is postmenopausal.  Obstetric History OB History  Gravida Para Term Preterm AB Living  1 1 1     1   SAB IAB Ectopic Multiple Live Births               # Outcome Date GA Lbr Len/2nd Weight Sex Delivery Anes PTL Lv  1 Term              ROS: A ROS was performed and pertinent positives and negatives are included in the history.  GENERAL: No fevers or chills. HEENT: No change in vision, no earache, sore throat or sinus congestion. NECK: No pain or stiffness. CARDIOVASCULAR: No chest pain or pressure. No palpitations. PULMONARY: No shortness of breath, cough or wheeze. GASTROINTESTINAL: No abdominal pain, nausea, vomiting or diarrhea, melena or bright red blood per rectum. GENITOURINARY: No urinary frequency, urgency, hesitancy or dysuria. MUSCULOSKELETAL: No joint or muscle pain, no back pain, no recent trauma. DERMATOLOGIC: No rash, no itching, no lesions. ENDOCRINE: No polyuria, polydipsia, no heat or cold intolerance. No recent change in weight. HEMATOLOGICAL: No anemia or easy bruising or bleeding. NEUROLOGIC: No headache, seizures, numbness, tingling or weakness. PSYCHIATRIC: No depression, no loss of interest in normal activity or change in sleep pattern.     Exam:   BP (!) 112/58 (BP Location: Right Arm, Patient Position: Sitting, Cuff Size: Normal)   Pulse 82   Resp 12   Ht 5' 1.5" (1.562 m)   Wt 161 lb 6.4 oz (73.2 kg)    BMI 30.00 kg/m   Body mass index is 30 kg/m.  General appearance : Well developed well nourished female. No acute distress HEENT: Eyes: no retinal hemorrhage or exudates,  Neck supple, trachea midline, no carotid bruits, no thyroidmegaly Lungs: Clear to auscultation, no rhonchi or wheezes, or rib retractions  Heart: Regular rate and rhythm, no murmurs or gallops Breast:Examined in sitting and supine position were symmetrical in appearance, no palpable masses or tenderness,  no skin retraction, no nipple inversion, no nipple discharge, no skin discoloration, no axillary or supraclavicular lymphadenopathy Abdomen: no palpable masses or tenderness, no rebound or guarding Extremities: no edema or skin discoloration or tenderness  Pelvic: Vulva: Normal             Vagina: No gross lesions or discharge  Cervix: No gross lesions or discharge  Uterus  AV, normal size, shape and consistency, non-tender and mobile  Adnexa  Without masses or tenderness  Anus: Normal   Assessment/Plan:  70 y.o. female for annual exam   1. Well female exam with routine gynecological exam Normal gynecologic exam in menopause.  No indication to repeat a Pap test this year, last Pap test neg January 2020.  Breast exam normal.  Screening mammogram September 2021 was negative.  Colonoscopy 2015.  Health labs with family physician.  Improved body mass index at 30.  Continue with stationary bike and healthy nutrition.  2. Postmenopausal Well on  no hormone replacement therapy.  No postmenopausal bleeding.  3. Osteopenia of multiple sites Bone density January 2020 showing osteopenia.  We will repeat a bone density now.  Continue on Evista.  Prescription sent to pharmacy.  Vitamin D supplements, total calcium intake of 1500 mg daily, regular weightbearing physical activities. - DG Bone Density; Future  Other orders - Cyanocobalamin (VITAMIN B-12 PO); Take by mouth. - Multiple Vitamins-Minerals (ZINC PO); Take by  mouth. - raloxifene (EVISTA) 60 MG tablet; Take 1 tablet (60 mg total) by mouth daily.  Genia Del MD, 11:12 AM 03/09/2020

## 2020-03-12 ENCOUNTER — Encounter: Payer: Self-pay | Admitting: Obstetrics & Gynecology

## 2020-04-01 ENCOUNTER — Other Ambulatory Visit: Payer: Self-pay | Admitting: Family Medicine

## 2020-04-26 ENCOUNTER — Other Ambulatory Visit: Payer: Self-pay | Admitting: Obstetrics & Gynecology

## 2020-04-26 ENCOUNTER — Ambulatory Visit (INDEPENDENT_AMBULATORY_CARE_PROVIDER_SITE_OTHER): Payer: Medicare Other

## 2020-04-26 ENCOUNTER — Other Ambulatory Visit: Payer: Self-pay

## 2020-04-26 DIAGNOSIS — M8589 Other specified disorders of bone density and structure, multiple sites: Secondary | ICD-10-CM

## 2020-04-26 DIAGNOSIS — Z780 Asymptomatic menopausal state: Secondary | ICD-10-CM

## 2020-05-09 ENCOUNTER — Telehealth: Payer: Self-pay

## 2020-05-09 NOTE — Telephone Encounter (Signed)
Patient called for bone density results. Advised they are not ready yet. Still pending. Advised patient that we will mail her a copy of result and we will call her if needed.

## 2020-05-18 ENCOUNTER — Ambulatory Visit (INDEPENDENT_AMBULATORY_CARE_PROVIDER_SITE_OTHER): Payer: Medicare Other

## 2020-05-18 VITALS — Ht 61.0 in | Wt 161.0 lb

## 2020-05-18 DIAGNOSIS — Z0000 Encounter for general adult medical examination without abnormal findings: Secondary | ICD-10-CM

## 2020-05-18 NOTE — Progress Notes (Signed)
Subjective:   Kelly Ranieri is a 70 y.o. female who presents for Medicare Annual (Subsequent) preventive examination.  I connected with Karsyn today by telephone and verified that I am speaking with the correct person using two identifiers. Location patient: home Location provider: work Persons participating in the virtual visit: patient, Engineer, civil (consulting).    I discussed the limitations, risks, security and privacy concerns of performing an evaluation and management service by telephone and the availability of in person appointments. I also discussed with the patient that there may be a patient responsible charge related to this service. The patient expressed understanding and verbally consented to this telephonic visit.    Interactive audio and video telecommunications were attempted between this provider and patient, however failed, due to patient having technical difficulties OR patient did not have access to video capability.  We continued and completed visit with audio only.  Some vital signs may be absent or patient reported.   Time Spent with patient on telephone encounter: 20 minutes   Review of Systems     Cardiac Risk Factors include: advanced age (>34men, >46 women);obesity (BMI >30kg/m2);dyslipidemia     Objective:    Today's Vitals   05/18/20 1031  Weight: 161 lb (73 kg)  Height: 5\' 1"  (1.549 m)   Body mass index is 30.42 kg/m.  Advanced Directives 05/18/2020 10/16/2016  Does Patient Have a Medical Advance Directive? Yes Yes  Type of Advance Directive Healthcare Power of Attorney Living will;Healthcare Power of Attorney  Copy of Healthcare Power of Attorney in Chart? Yes - validated most recent copy scanned in chart (See row information) No - copy requested    Current Medications (verified) Outpatient Encounter Medications as of 05/18/2020  Medication Sig  . Calcium Carbonate-Vitamin D 600-400 MG-UNIT per tablet Take 1 tablet by mouth daily.  . Cyanocobalamin (VITAMIN  B-12 PO) Take by mouth.  . escitalopram (LEXAPRO) 20 MG tablet Take 20 mg by mouth daily.  07/18/2020 LORazepam (ATIVAN) 1 MG tablet Take 1 tablet by mouth 2 (two) times daily as needed.  . Magnesium 500 MG CAPS Take 1 capsule by mouth daily.  . Multiple Vitamin (MULTIVITAMIN) tablet Take 1 tablet by mouth daily.  . Multiple Vitamins-Minerals (ZINC PO) Take by mouth.  Marland Kitchen omeprazole (PRILOSEC) 40 MG capsule TAKE 1 CAPSULE BY MOUTH TWICE A DAY  . raloxifene (EVISTA) 60 MG tablet Take 1 tablet (60 mg total) by mouth daily.  . TURMERIC PO Take 1 capsule by mouth daily.   No facility-administered encounter medications on file as of 05/18/2020.    Allergies (verified) Patient has no known allergies.   History: Past Medical History:  Diagnosis Date  . Acute parotitis 11/2015   hx of:  sialolithiasis suspected by ENT--spont resolved.  . Allergy    SEASONAL  . Anxiety   . Atrophic vaginitis   . Cervical cancer screening    PAP normal 2017: GYN to repeat 2020.  2021 Chronic headaches    tension, mirgraines  . Depression   . Diverticulosis   . Duodenal ulcer 1989  . Dysplasia of cervix, low grade (CIN 1)    S/P LEEP  . Endometrial hyperplasia    SIMPLE  . GERD (gastroesophageal reflux disease)   . 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  Much improved at Dr. 10/2015 f/u 10/2015.  11/2015 Hiatal hernia   . Humerus fracture 12/19/2017   LEFT: mildly displaced comminuted fracture of the proximal left humerus.  . Hyperlipidemia    LDL goal = <  140.  No meds as of 04/2016.  . Iron deficiency anemia Fall 2019   Celiac screen NEG, hemoccults neg.  Pt to get EGD and TCS 12/19/17.  Endoscopies cancelled b/c pt had horrible time w/prep and fell and broke humerus when trying to reach her bathroom quickly--pt declines endosc as of 06/2018.  Labs stable/normal at that time.  . Medial meniscus tear 04/2014   R knee  . Osteoarthritis    KNEES  . Osteoporosis    GYN managing; as of 01/2016 pt is on Evista.  She  is intol of oral bisphosph due to GERD.  . Other and unspecified ovarian cysts   . Prediabetes 2018   A1c 6% 04/2016  . Proximal humerus fracture 12/19/2017   Mildly displaced and comminuted proximal left humeral head and neck fracture.  . Rib fracture 09/04/2017   Mildly displaced R 6th rib fx   Past Surgical History:  Procedure Laterality Date  . ABDOMINAL SURGERY  2003    EXPLORATORY LAPAROTOMY, LSO  . ADENOIDECTOMY  1954  . CARPAL TUNNEL RELEASE Bilateral 2001  . CHOLECYSTECTOMY  1989   stones  . COLONOSCOPY  2011; 03/2013   2011 NORMAL.  03/2013 TCS done for heme+ stool: no polyps.  Repeat 10 yrs (after 03/2023).  Marland Kitchen DEXA  03/23/2014; 2020   Osteopenia.  Pt took prolia x 2 injections, then was switched to evista by her GYN.  DEXA 01/17/16 T score a bit worse, same regimen rec'd, as of 02/2019 doing well on raloxifene, rpt DEXA planned in 1 yr. (by GYN)  . ENDOMETRIAL BIOPSY  09/27/2015   PATH:  BENIGN  . endoscopy upper GI  2000; 05/2014   negative 2000.  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 Russella Dar is PPI bid long term.  Marland Kitchen HYSTEROSCOPY     UTERINE POLYPS  . MOUTH SURGERY    . OOPHORECTOMY  2003   LSO WITH EXPLORATORY LAPAROTOMY  . TUBAL LIGATION  1987   Family History  Problem Relation Age of Onset  . Heart attack Father        abnormal EKG; ? AVR  . Diabetes Father   . Heart disease Father   . Hyperlipidemia Mother   . Anxiety disorder Mother   . Depression Mother   . Irregular heart beat Sister   . Heart disease Maternal Grandfather   . Heart failure Maternal Grandfather   . Osteoporosis Maternal Grandmother   . Colon cancer Neg Hx    Social History   Socioeconomic History  . Marital status: Married    Spouse name: Not on file  . Number of children: 1  . Years of education: Not on file  . Highest education level: Not on file  Occupational History  . Occupation: retired    Associate Professor:  NOT EMPLOYED  Tobacco Use  . Smoking status: Never Smoker  . Smokeless tobacco: Never Used  Vaping Use  . Vaping Use: Never used  Substance and Sexual Activity  . Alcohol use: Yes    Alcohol/week: 1.0 standard drink    Types: 1 Glasses of wine per week    Comment: Rarely  . Drug use: No  . Sexual activity: Yes    Birth control/protection: Surgical  Other Topics Concern  . Not on file  Social History Narrative   Married, one son.   Orig from [place] area, lives in [place].   Occupation: retired from  [employer], systems(admin assistant).   No tob, rare, alcohol, no drugs.   Exercise: stationary bike 6 days a week.  [facility]   Social Determinants of Health   Financial Resource Strain: Low Risk   . Difficulty of Paying Living Expenses: Not hard at all  Food Insecurity: No Food Insecurity  . Worried About Programme researcher, broadcasting/film/videounning Out of Food in the Last Year: Never true  . Ran Out of Food in the Last Year: Never true  Transportation Needs: No Transportation Needs  . Lack of Transportation (Medical): No  . Lack of Transportation (Non-Medical): No  Physical Activity: Sufficiently Active  . Days of Exercise per Week: 6 days  . Minutes of Exercise per Session: 30 min  Stress: No Stress Concern Present  . Feeling of Stress : Only a little  Social Connections: Moderately Isolated  . Frequency of Communication with Friends and Family: More than three times a week  . Frequency of Social Gatherings with Friends and Family: More than three times a week  . Attends Religious Services: Never  . Active Member of Clubs or Organizations: No  . Attends BankerClub or Organization Meetings: Never  . Marital Status: Married    Tobacco Counseling Counseling given: Not Answered   Clinical Intake:  Pre-visit preparation completed: Yes  Pain : No/denies pain     Nutritional Status: BMI > 30  Obese Nutritional Risks: None Diabetes: No  How often do you need to have someone help you when  you read instructions, pamphlets, or other written materials from your doctor or pharmacy?: 1 - Never  Diabetic?No  Interpreter Needed?: No  Information entered by :: Thomasenia SalesMartha Champion Corales LPN   Activities of Daily Living In your present state of health, do you have any difficulty performing the following activities: 05/18/2020 02/09/2020  Hearing? N N  Vision? N N  Difficulty concentrating or making decisions? N N  Walking or climbing stairs? N N  Dressing or bathing? N N  Doing errands, shopping? N N  Preparing Food and eating ? N -  Using the Toilet? N -  In the past six months, have you accidently leaked urine? N -  Do you have problems with loss of bowel control? N -  Managing your Medications? N -  Managing your Finances? N -  Housekeeping or managing your Housekeeping? N -  Some recent data might be hidden    Patient Care Team: Jeoffrey MassedMcGowen, Philip H, MD as PCP - General (Family Medicine) Judi SaaSmith, Zachary M, DO as Consulting Physician (Sports Medicine) Meryl DareStark, Malcolm T, MD as Consulting Physician (Gastroenterology) Suzanna ObeyByers, John, MD as Consulting Physician (Otolaryngology) Milagros EvenerKaur, Rupinder, MD as Consulting Physician (Psychiatry) Genia DelLavoie, Marie-Lyne, MD as Consulting Physician (Obstetrics and Gynecology) Rafael BihariMorse and Doyle (Dentistry) Dermatology, Andre [place]ef[place] Schmitz, Marye RoundJeremy E, MD as Consulting Physician (Sports Medicine)  Indicate any recent Medical Services you may have received from other than [facility] providers in the past year (date may be approximate).     Assessment:   This is a routine wellness examination for Nicholos JohnsKathleen.  Hearing/Vision screen  Hearing Screening   125Hz  250Hz  500Hz  1000Hz  2000Hz  3000Hz  4000Hz  6000Hz  8000Hz   Right ear:           Left ear:           Comments: No issues  Vision Screening Comments: Wears contacts  Last eye exam-07/2019-[facility]  Dietary issues and exercise activities discussed: Current Exercise Habits: Home exercise routine, Type of  exercise: Other -  see comments;walking (exercise bike), Time (Minutes): 30, Frequency (Times/Week): 6, Weekly Exercise (Minutes/Week): 180, Intensity: Mild, Exercise limited by: None identified  Goals    . Patient Stated     Drink more water    . Weight (lb) < 140 lb (63.5 kg)     Lose weight by continuing to watch sweets and be active.       Depression Screen PHQ 2/9 Scores 05/18/2020 12/29/2018 06/27/2018 10/17/2017 10/17/2017 10/16/2016 10/13/2015  PHQ - 2 Score 1 0 0 1 1 1  0  PHQ- 9 Score - 0 0 3 - - -    Fall Risk Fall Risk  05/18/2020 02/09/2020 06/27/2018 10/17/2017 10/16/2016  Falls in the past year? 0 0 1 Yes No  Number falls in past yr: 0 0 0 1 -  Injury with Fall? 0 0 1 Yes -  Risk Factor Category  - - - (No Data) -  Comment - - - Not high fall risk -  Risk for fall due to : - - - Other (Comment) -  Risk for fall due to: Comment - - - Pt not hight risk fall -  Follow up Falls prevention discussed Falls evaluation completed Falls evaluation completed Education provided -    FALL RISK PREVENTION PERTAINING TO THE HOME:  Any stairs in or around the home? Yes  If so, are there any without handrails? No  Home free of loose throw rugs in walkways, pet beds, electrical cords, etc? Yes  Adequate lighting in your home to reduce risk of falls? Yes   ASSISTIVE DEVICES UTILIZED TO PREVENT FALLS:  Life alert? No  Use of a cane, walker or w/c? No  Grab bars in the bathroom? Yes  Shower chair or bench in shower? No  Elevated toilet seat or a handicapped toilet? No   TIMED UP AND GO:  Was the test performed? No . Phone visit   Cognitive Function:Normal cognitive status assessed by  this Nurse Health Advisor. No abnormalities found.          Immunizations Immunization History  Administered Date(s) Administered  . Influenza Split 11/21/2010, 11/20/2011  . Influenza Whole 11/26/2007, 11/18/2008, 11/08/2009  . Influenza, High Dose Seasonal PF 10/16/2016, 10/11/2017, 10/18/2018,  09/30/2019  . Influenza,inj,Quad PF,6+ Mos 11/25/2012, 10/21/2013, 10/26/2014, 11/01/2015  . Pneumococcal Conjugate-13 10/13/2015  . Pneumococcal Polysaccharide-23 10/16/2016  . Tdap 05/16/2010  . Zoster 11/15/2009  . Zoster Recombinat (Shingrix) 08/04/2017    TDAP status: Due, Education has been provided regarding the importance of this vaccine. Advised may receive this vaccine at local pharmacy or Health Dept. Aware to provide a copy of the vaccination record if obtained from local pharmacy or Health Dept. Verbalized acceptance and understanding.  Flu Vaccine status: Up to date  Pneumococcal vaccine status: Up to date  Covid-19 vaccine status: Declined, Education has been provided regarding the importance of this vaccine but patient still declined. Advised may receive this vaccine at local pharmacy or Health Dept.or vaccine clinic. Aware to provide a copy of the vaccination record if obtained from local pharmacy or Health Dept. Verbalized acceptance and understanding.  Qualifies for Shingles Vaccine? No   Zostavax completed Yes   Shingrix Completed?: Yes  Screening Tests Health Maintenance  Topic Date Due  . TETANUS/TDAP  05/15/2020  . COVID-19 Vaccine (1) 06/03/2020 (Originally 11/04/1955)  . INFLUENZA VACCINE  09/12/2020  . MAMMOGRAM  10/13/2020  . COLONOSCOPY (Pts 45-61yrs Insurance coverage will need to be confirmed)  03/25/2023  . DEXA SCAN  Completed  .  Hepatitis C Screening  Completed  . PNA vac Low Risk Adult  Completed  . HPV VACCINES  Aged Out    Health Maintenance  Health Maintenance Due  Topic Date Due  . TETANUS/TDAP  05/15/2020    Colorectal cancer screening: Type of screening: Colonoscopy. Completed 03/24/2013. Repeat every 10 years  Mammogram status: Completed Bilateral 10/14/2019. Repeat every year  Bone Density status: Completed 04/26/2020. Results reflect: Bone density results: OSTEOPENIA. Repeat every 2 years.  Lung Cancer Screening: (Low Dose CT Chest  recommended if Age 29-80 years, 30 pack-year currently smoking OR have quit w/in 15years.) does not qualify.     Additional Screening:  Hepatitis C Screening:Completed 10/13/2015  Vision Screening: Recommended annual ophthalmology exams for early detection of glaucoma and other disorders of the eye. Is the patient up to date with their annual eye exam?  Yes  Who is the provider or what is the name of the office in which the patient attends annual eye exams? [facility]   Dental Screening: Recommended annual dental exams for proper oral hygiene  Community Resource Referral / Chronic Care Management: CRR required this visit?  No   CCM required this visit?  No      Plan:     I have personally reviewed and noted the following in the patient's chart:   . Medical and social history . Use of alcohol, tobacco or illicit drugs  . Current medications and supplements . Functional ability and status . Nutritional status . Physical activity . Advanced directives . List of other physicians . Hospitalizations, surgeries, and ER visits in previous 12 months . Vitals . Screenings to include cognitive, depression, and falls . Referrals and appointments  In addition, I have reviewed and discussed with patient certain preventive protocols, quality metrics, and best practice recommendations. A written personalized care plan for preventive services as well as general preventive health recommendations were provided to patient.   Due to this being a telephonic visit, the after visit summary with patients personalized plan was offered to patient via mail or my-chart. Patient declined at this time.    Roanna Raider, LPN   07/14/5636  Nurse Health Advisor  Nurse Notes: None

## 2020-05-18 NOTE — Patient Instructions (Signed)
Ms. Suzanne Spencer , Thank you for taking time to complete your Medicare Wellness Visit. I appreciate your ongoing commitment to your health goals. Please review the following plan we discussed and let me know if I can assist you in the future.   Screening recommendations/referrals: Colonoscopy: Completed 03/24/2013-Due 03/25/2023 Mammogram: Completed 10/14/2019-Due 10/13/2020 Bone Density: Completed 04/26/2020-Due 04/27/2022 Recommended yearly ophthalmology/optometry visit for glaucoma screening and checkup Recommended yearly dental visit for hygiene and checkup  Vaccinations: Influenza vaccine: Up to date Pneumococcal vaccine: Completed vaccines Tdap vaccine: Discuss with pharmacy Shingles vaccine: Completed vaccines Covid-19:Declined  Advanced directives: Copy in chart  Conditions/risks identified: See problem list  Next appointment: Follow up in one year for your annual wellness visit    Preventive Care 65 Years and Older, Female Preventive care refers to lifestyle choices and visits with your health care provider that can promote health and wellness. What does preventive care include?  A yearly physical exam. This is also called an annual well check.  Dental exams once or twice a year.  Routine eye exams. Ask your health care provider how often you should have your eyes checked.  Personal lifestyle choices, including:  Daily care of your teeth and gums.  Regular physical activity.  Eating a healthy diet.  Avoiding tobacco and drug use.  Limiting alcohol use.  Practicing safe sex.  Taking low-dose aspirin every day.  Taking vitamin and mineral supplements as recommended by your health care provider. What happens during an annual well check? The services and screenings done by your health care provider during your annual well check will depend on your age, overall health, lifestyle risk factors, and family history of disease. Counseling  Your health care provider may ask you  questions about your:  Alcohol use.  Tobacco use.  Drug use.  Emotional well-being.  Home and relationship well-being.  Sexual activity.  Eating habits.  History of falls.  Memory and ability to understand (cognition).  Work and work Astronomer.  Reproductive health. Screening  You may have the following tests or measurements:  Height, weight, and BMI.  Blood pressure.  Lipid and cholesterol levels. These may be checked every 5 years, or more frequently if you are over 1 years old.  Skin check.  Lung cancer screening. You may have this screening every year starting at age 75 if you have a 30-pack-year history of smoking and currently smoke or have quit within the past 15 years.  Fecal occult blood test (FOBT) of the stool. You may have this test every year starting at age 61.  Flexible sigmoidoscopy or colonoscopy. You may have a sigmoidoscopy every 5 years or a colonoscopy every 10 years starting at age 68.  Hepatitis C blood test.  Hepatitis B blood test.  Sexually transmitted disease (STD) testing.  Diabetes screening. This is done by checking your blood sugar (glucose) after you have not eaten for a while (fasting). You may have this done every 1-3 years.  Bone density scan. This is done to screen for osteoporosis. You may have this done starting at age 53.  Mammogram. This may be done every 1-2 years. Talk to your health care provider about how often you should have regular mammograms. Talk with your health care provider about your test results, treatment options, and if necessary, the need for more tests. Vaccines  Your health care provider may recommend certain vaccines, such as:  Influenza vaccine. This is recommended every year.  Tetanus, diphtheria, and acellular pertussis (Tdap, Td) vaccine. You may  need a Td booster every 10 years.  Zoster vaccine. You may need this after age 58.  Pneumococcal 13-valent conjugate (PCV13) vaccine. One dose is  recommended after age 49.  Pneumococcal polysaccharide (PPSV23) vaccine. One dose is recommended after age 19. Talk to your health care provider about which screenings and vaccines you need and how often you need them. This information is not intended to replace advice given to you by your health care provider. Make sure you discuss any questions you have with your health care provider. Document Released: 02/25/2015 Document Revised: 10/19/2015 Document Reviewed: 11/30/2014 Elsevier Interactive Patient Education  2017 [place] Prevention in the Home Falls can cause injuries. They can happen to people of all ages. There are many things you can do to make your home safe and to help prevent falls. What can I do on the outside of my home?  Regularly fix the edges of walkways and driveways and fix any cracks.  Remove anything that might make you trip as you walk through a door, such as a raised step or threshold.  Trim any bushes or trees on the path to your home.  Use bright outdoor lighting.  Clear any walking paths of anything that might make someone trip, such as rocks or tools.  Regularly check to see if handrails are loose or broken. Make sure that both sides of any steps have handrails.  Any raised decks and porches should have guardrails on the edges.  Have any leaves, snow, or ice cleared regularly.  Use sand or salt on walking paths during winter.  Clean up any spills in your garage right away. This includes oil or grease spills. What can I do in the bathroom?  Use night lights.  Install grab bars by the toilet and in the tub and shower. Do not use towel bars as grab bars.  Use non-skid mats or decals in the tub or shower.  If you need to sit down in the shower, use a plastic, non-slip stool.  Keep the floor dry. Clean up any water that spills on the floor as soon as it happens.  Remove soap buildup in the tub or shower regularly.  Attach bath mats  securely with double-sided non-slip rug tape.  Do not have throw rugs and other things on the floor that can make you trip. What can I do in the bedroom?  Use night lights.  Make sure that you have a light by your bed that is easy to reach.  Do not use any sheets or blankets that are too big for your bed. They should not hang down onto the floor.  Have a firm chair that has side arms. You can use this for support while you get dressed.  Do not have throw rugs and other things on the floor that can make you trip. What can I do in the kitchen?  Clean up any spills right away.  Avoid walking on wet floors.  Keep items that you use a lot in easy-to-reach places.  If you need to reach something above you, use a strong step stool that has a grab bar.  Keep electrical cords out of the way.  Do not use floor polish or wax that makes floors slippery. If you must use wax, use non-skid floor wax.  Do not have throw rugs and other things on the floor that can make you trip. What can I do with my stairs?  Do not leave any items on  the stairs.  Make sure that there are handrails on both sides of the stairs and use them. Fix handrails that are broken or loose. Make sure that handrails are as long as the stairways.  Check any carpeting to make sure that it is firmly attached to the stairs. Fix any carpet that is loose or worn.  Avoid having throw rugs at the top or bottom of the stairs. If you do have throw rugs, attach them to the floor with carpet tape.  Make sure that you have a light switch at the top of the stairs and the bottom of the stairs. If you do not have them, ask someone to add them for you. What else can I do to help prevent falls?  Wear shoes that:  Do not have high heels.  Have rubber bottoms.  Are comfortable and fit you well.  Are closed at the toe. Do not wear sandals.  If you use a stepladder:  Make sure that it is fully opened. Do not climb a closed  stepladder.  Make sure that both sides of the stepladder are locked into place.  Ask someone to hold it for you, if possible.  Clearly mark and make sure that you can see:  Any grab bars or handrails.  First and last steps.  Where the edge of each step is.  Use tools that help you move around (mobility aids) if they are needed. These include:  Canes.  Walkers.  Scooters.  Crutches.  Turn on the lights when you go into a dark area. Replace any light bulbs as soon as they burn out.  Set up your furniture so you have a clear path. Avoid moving your furniture around.  If any of your floors are uneven, fix them.  If there are any pets around you, be aware of where they are.  Review your medicines with your doctor. Some medicines can make you feel dizzy. This can increase your chance of falling. Ask your doctor what other things that you can do to help prevent falls. This information is not intended to replace advice given to you by your health care provider. Make sure you discuss any questions you have with your health care provider. Document Released: 11/25/2008 Document Revised: 07/07/2015 Document Reviewed: 03/05/2014 Elsevier Interactive Patient Education  2017 [employer].

## 2020-09-23 ENCOUNTER — Other Ambulatory Visit: Payer: Self-pay | Admitting: Family Medicine

## 2020-11-12 HISTORY — PX: OTHER SURGICAL HISTORY: SHX169

## 2020-11-21 ENCOUNTER — Telehealth: Payer: Self-pay | Admitting: Family Medicine

## 2020-11-21 NOTE — Telephone Encounter (Signed)
Pt called asking if there is an ear specialist she could be referred to. Not for hearing loss.. more for nerve damage.

## 2020-11-21 NOTE — Telephone Encounter (Signed)
Spoke with pt and appt recommended for evaluation and referral. Pt agreed and scheduled appt

## 2020-11-24 ENCOUNTER — Ambulatory Visit: Payer: Medicare Other | Admitting: Family Medicine

## 2020-12-01 ENCOUNTER — Ambulatory Visit: Payer: Medicare Other | Admitting: Family Medicine

## 2020-12-01 ENCOUNTER — Encounter: Payer: Self-pay | Admitting: Family Medicine

## 2020-12-01 ENCOUNTER — Other Ambulatory Visit: Payer: Self-pay

## 2020-12-01 VITALS — BP 124/75 | HR 91 | Temp 98.3°F | Ht 61.5 in | Wt 166.8 lb

## 2020-12-01 DIAGNOSIS — Z8673 Personal history of transient ischemic attack (TIA), and cerebral infarction without residual deficits: Secondary | ICD-10-CM | POA: Diagnosis not present

## 2020-12-01 DIAGNOSIS — H9319 Tinnitus, unspecified ear: Secondary | ICD-10-CM

## 2020-12-01 NOTE — Progress Notes (Signed)
OFFICE VISIT  12/01/2020  CC:  Chief Complaint  Patient presents with   Hearing concerns    Would like referral or recommendations for possible tinnitus or nerve damage, R ear that never seizes. She had mri in the past and hearing test.     HPI:    Patient is a 70 y.o. female who presents for hearing concerns.  INTERIM HX: 3-4 yr hx of R ear constant crackling/static noise.  Low level intensity usually, occ stops completely, other times "like my head is exploding".  No hearing loss. Audiology/ENT eval 3-4 yrs ago, hearing normal.  MRI brain at that time, ? Evidence of remote CVA.   No hx of head trauma or ototoxic meds. Occ feels "off balance" but no vertigo.  Past Medical History:  Diagnosis Date   Acute parotitis 11/2015   hx of:  sialolithiasis suspected by ENT--spont resolved.   Allergy    SEASONAL   Anxiety    Atrophic vaginitis    Cervical cancer screening    PAP normal 2017: GYN to repeat 2020.   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  Much improved at Dr. Michaelle Copas f/u 10/2015.   Hiatal hernia    Humerus fracture 12/19/2017   LEFT: mildly displaced comminuted fracture of the proximal left humerus.   Hyperlipidemia    LDL goal = < 140.  No meds as of 04/2016.   Iron deficiency anemia Fall 2019   Celiac screen NEG, hemoccults neg.  Pt to get EGD and TCS 12/19/17.  Endoscopies cancelled b/c pt had horrible time w/prep and fell and broke humerus when trying to reach her bathroom quickly--pt declines endosc as of 06/2018.  Labs stable/normal at that time.   Medial meniscus tear 04/2014   R knee   Osteoarthritis    KNEES   Osteoporosis    GYN managing; as of 01/2016 pt is on Evista.  She is intol of oral bisphosph due to GERD.   Other  and unspecified ovarian cysts    Prediabetes 2018   A1c 6% 04/2016   Proximal humerus fracture 12/19/2017   Mildly displaced and comminuted proximal left humeral head and neck fracture.   Rib fracture 09/04/2017   Mildly displaced R 6th rib fx    Past Surgical History:  Procedure Laterality Date   ABDOMINAL SURGERY  2003    EXPLORATORY LAPAROTOMY, LSO   ADENOIDECTOMY  1954   CARPAL TUNNEL RELEASE Bilateral 2001   CHOLECYSTECTOMY  1989   stones   COLONOSCOPY  2011; 03/2013   2011 NORMAL.  03/2013 TCS done for heme+ stool: no polyps.  Repeat 10 yrs (after 03/2023).   DEXA  03/23/2014; 2020   Osteopenia.  Pt took prolia x 2 injections, then was switched to evista by her GYN.  DEXA 01/17/16 T score a bit worse, same regimen rec'd, as of 02/2019 doing well on raloxifene, rpt DEXA planned in 1 yr. (by GYN)   ENDOMETRIAL BIOPSY  09/27/2015   PATH:  BENIGN   endoscopy upper GI  2000; 05/2014   negative 2000.  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 Russella Dar is PPI  bid long term.   HYSTEROSCOPY     UTERINE POLYPS   MOUTH SURGERY     OOPHORECTOMY  2003   LSO WITH EXPLORATORY LAPAROTOMY   TUBAL LIGATION  1987    Outpatient Medications Prior to Visit  Medication Sig Dispense Refill   Calcium Carbonate-Vitamin D 600-400 MG-UNIT per tablet Take 1 tablet by mouth daily.     Cyanocobalamin (VITAMIN B-12 PO) Take by mouth.     LORazepam (ATIVAN) 1 MG tablet Take 1 tablet by mouth 2 (two) times daily as needed.     Magnesium 500 MG CAPS Take 1 capsule by mouth daily.     Multiple Vitamin (MULTIVITAMIN) tablet Take 1 tablet by mouth daily.     Multiple Vitamins-Minerals (ZINC PO) Take by mouth.     omeprazole (PRILOSEC) 40 MG capsule TAKE 1 CAPSULE BY MOUTH TWICE A DAY 180 capsule 1   raloxifene (EVISTA) 60 MG tablet Take 1 tablet (60 mg total) by mouth daily. 90 tablet 4   TURMERIC PO Take 1 capsule by mouth daily.      escitalopram (LEXAPRO) 20 MG tablet Take 20 mg by mouth daily. (Patient not taking: Reported on 12/01/2020)     No facility-administered medications prior to visit.    No Known Allergies  ROS As per HPI  PE: Vitals with BMI 12/01/2020 05/18/2020 03/09/2020  Height 5' 1.5" 5\' 1"  5' 1.5"  Weight 166 lbs 13 oz 161 lbs 161 lbs 6 oz  BMI 31.01 30.44 30.01  Systolic 124 - 112  Diastolic 75 - 58  Pulse 91 - 82    General: Alert and oriented x4, well-appearing. Affect: Lucid thought and speech, pleasant. ENT: Ears: EACs clear, normal epithelium.  TMs with good light reflex and landmarks bilaterally.  Eyes: no injection, icteris, swelling, or exudate.  EOMI, PERRLA. Nose: no drainage or turbinate edema/swelling.  No injection or focal lesion.  Mouth: lips without lesion/swelling.  Oral mucosa pink and moist.  Dentition intact and without obvious caries or gingival swelling.  Oropharynx without erythema, exudate, or swelling.  Neck: no TM, mass, adenopathy, or bruit.   LABS:  Lab Results  Component Value Date   TSH 1.74 02/09/2020   Lab Results  Component Value Date   WBC 5.3 02/09/2020   HGB 12.4 02/09/2020   HCT 37.4 02/09/2020   MCV 88.8 02/09/2020   PLT 131.0 (L) 02/09/2020   Lab Results  Component Value Date   IRON 70 06/27/2018   TIBC 414 06/27/2018   FERRITIN 46 06/27/2018   Lab Results  Component Value Date   VITAMINB12 431 09/29/2008    Lab Results  Component Value Date   CREATININE 0.79 02/09/2020   BUN 20 02/09/2020   NA 142 02/09/2020   K 4.2 02/09/2020   CL 107 02/09/2020   CO2 30 02/09/2020   Lab Results  Component Value Date   ALT 14 02/09/2020   AST 16 02/09/2020   ALKPHOS 77 02/09/2020   BILITOT 0.4 02/09/2020   Lab Results  Component Value Date   CHOL 190 02/09/2020   Lab Results  Component Value Date   HDL 77.70 02/09/2020   Lab Results  Component Value Date   LDLCALC 100 (H) 02/09/2020   Lab Results  Component Value Date   TRIG  62.0 02/09/2020   Lab Results  Component Value Date   CHOLHDL 2 02/09/2020   Lab Results  Component Value Date   HGBA1C 5.5 02/09/2020    IMPRESSION AND PLAN:  #  1 chronic unilateral tinnitus: This is not pulsatile or roaring.  MRI brain around the time of onset did not show any lesion to explain this, but did show "history of a stroke" per pt. she recalls no history of any symptoms consistent with a stroke at any time. She has no hearing deficit. No history of ototoxic medications. Plan is observation at this point.  Other differential diagnosis to consider in the future is multiple sclerosis or cerebral aneurysm (although her description of the type of tinnitus she has is not c/w this). She declined ENT or neurology referral at this time.  2.  Question history of CVA.  Per patient report this was noted on MRI brain 3 to 4 years ago.  Will do carotid ultrasound--ordered today.  Spent 30 min with pt today reviewing HPI, reviewing relevant past history, doing exam, reviewing and discussing lab and imaging data, and formulating plans.  An After Visit Summary was printed and given to the patient.  FOLLOW UP: Return for keep 02/10/21 appt.  Signed:  Santiago Bumpers, MD           12/01/2020

## 2020-12-08 ENCOUNTER — Encounter: Payer: Self-pay | Admitting: Family Medicine

## 2020-12-08 ENCOUNTER — Ambulatory Visit (INDEPENDENT_AMBULATORY_CARE_PROVIDER_SITE_OTHER): Payer: Medicare Other

## 2020-12-08 ENCOUNTER — Other Ambulatory Visit: Payer: Self-pay

## 2020-12-08 DIAGNOSIS — Z8673 Personal history of transient ischemic attack (TIA), and cerebral infarction without residual deficits: Secondary | ICD-10-CM

## 2020-12-26 ENCOUNTER — Telehealth: Payer: Self-pay

## 2020-12-26 NOTE — Telephone Encounter (Signed)
Patient refill request.  Patient states she is no longer seeing provider that issued RX, Dr. Milinda Cave is aware patient takes.  Patient would like Dr. Milinda Cave to maintenance medications from here on out.  [facility] [address] LORazepam (ATIVAN) 1 MG tablet [[id number]]

## 2020-12-27 MED ORDER — LORAZEPAM 1 MG PO TABS: 1.0000 mg | ORAL_TABLET | Freq: Two times a day (BID) | ORAL | 0 refills | Status: DC | PRN

## 2020-12-27 NOTE — Telephone Encounter (Signed)
LM for pt to returncall

## 2020-12-27 NOTE — Telephone Encounter (Signed)
OK, but I will only do 30d supply at this time. Needs appt to discuss this topic-->she is due for CPE so she can make appt for this and we'll discuss.

## 2020-12-27 NOTE — Telephone Encounter (Signed)
Pt advised of refill and already has CPE scheduled on 12/30

## 2020-12-27 NOTE — Telephone Encounter (Signed)
Pt was last seen 02/09/20 for f/u or CPE. Mentioned in o/v note: Anx/dep: stable on lexapro and ativan per Dr. Evelene Croon. Next f/u appt is 02/10/21  Please review and advise

## 2021-01-23 ENCOUNTER — Telehealth: Payer: Self-pay | Admitting: Family Medicine

## 2021-01-23 MED ORDER — LORAZEPAM 1 MG PO TABS: 1.0000 mg | ORAL_TABLET | Freq: Three times a day (TID) | ORAL | 1 refills | Status: DC | PRN

## 2021-01-23 NOTE — Telephone Encounter (Signed)
LVM for pt regarding medication.   

## 2021-01-23 NOTE — Telephone Encounter (Signed)
Requesting: Lorazepam Contract: N/A UDS: N/A Last Visit: 12/01/20 Next Visit: 02/10/21 Last Refill: 12/27/20(30,0)  Please Advise. Medication pending

## 2021-01-23 NOTE — Telephone Encounter (Signed)
Pt called stating her mother passed 2 days ago, in addition to the other things she had going on. Pt states that she is almost out of Lorazepam and has been taking for anxiety and in the last 2 days is having panic attacks.   LORazepam (ATIVAN) 1 MG tablet  [id number] [place] [place] - [address] Phone:  [phone]  Fax:  [phone]     Phone # [phone]

## 2021-01-23 NOTE — Telephone Encounter (Signed)
OK, lorazepam eRx'd. 

## 2021-01-23 NOTE — Telephone Encounter (Signed)
Pt advised of refill

## 2021-02-10 ENCOUNTER — Other Ambulatory Visit: Payer: Self-pay

## 2021-02-10 ENCOUNTER — Ambulatory Visit (INDEPENDENT_AMBULATORY_CARE_PROVIDER_SITE_OTHER): Payer: Medicare Other | Admitting: Family Medicine

## 2021-02-10 ENCOUNTER — Encounter: Payer: Self-pay | Admitting: Family Medicine

## 2021-02-10 VITALS — BP 118/73 | HR 89 | Temp 98.0°F | Ht 61.0 in | Wt 167.2 lb

## 2021-02-10 DIAGNOSIS — F4323 Adjustment disorder with mixed anxiety and depressed mood: Secondary | ICD-10-CM

## 2021-02-10 DIAGNOSIS — E7800 Pure hypercholesterolemia, unspecified: Secondary | ICD-10-CM

## 2021-02-10 DIAGNOSIS — Z0000 Encounter for general adult medical examination without abnormal findings: Secondary | ICD-10-CM

## 2021-02-10 DIAGNOSIS — F411 Generalized anxiety disorder: Secondary | ICD-10-CM

## 2021-02-10 DIAGNOSIS — R7303 Prediabetes: Secondary | ICD-10-CM

## 2021-02-10 DIAGNOSIS — K219 Gastro-esophageal reflux disease without esophagitis: Secondary | ICD-10-CM

## 2021-02-10 LAB — CBC WITH DIFFERENTIAL/PLATELET
Basophils Absolute: 0 10*3/uL (ref 0.0–0.1)
Basophils Relative: 0.8 % (ref 0.0–3.0)
Eosinophils Absolute: 0.1 10*3/uL (ref 0.0–0.7)
Eosinophils Relative: 2 % (ref 0.0–5.0)
HCT: 40.5 % (ref 36.0–46.0)
Hemoglobin: 12.9 g/dL (ref 12.0–15.0)
Lymphocytes Relative: 34.3 % (ref 12.0–46.0)
Lymphs Abs: 2 10*3/uL (ref 0.7–4.0)
MCHC: 31.9 g/dL (ref 30.0–36.0)
MCV: 89.4 fl (ref 78.0–100.0)
Monocytes Absolute: 0.4 10*3/uL (ref 0.1–1.0)
Monocytes Relative: 6.7 % (ref 3.0–12.0)
Neutro Abs: 3.3 10*3/uL (ref 1.4–7.7)
Neutrophils Relative %: 56.2 % (ref 43.0–77.0)
Platelets: 168 10*3/uL (ref 150.0–400.0)
RBC: 4.53 Mil/uL (ref 3.87–5.11)
RDW: 13.9 % (ref 11.5–15.5)
WBC: 5.9 10*3/uL (ref 4.0–10.5)

## 2021-02-10 LAB — LIPID PANEL
Cholesterol: 227 mg/dL — ABNORMAL HIGH (ref 0–200)
HDL: 86.1 mg/dL (ref >39.00)
LDL Cholesterol: 124 mg/dL — ABNORMAL HIGH (ref 0–99)
NonHDL: 140.8
Total CHOL/HDL Ratio: 3
Triglycerides: 86 mg/dL (ref 0.0–149.0)
VLDL: 17.2 mg/dL (ref 0.0–40.0)

## 2021-02-10 LAB — COMPREHENSIVE METABOLIC PANEL
ALT: 17 U/L (ref 0–35)
AST: 21 U/L (ref 0–37)
Albumin: 4.1 g/dL (ref 3.5–5.2)
Alkaline Phosphatase: 85 U/L (ref 39–117)
BUN: 14 mg/dL (ref 6–23)
CO2: 29 mEq/L (ref 19–32)
Calcium: 9.4 mg/dL (ref 8.4–10.5)
Chloride: 105 mEq/L (ref 96–112)
Creatinine, Ser: 0.87 mg/dL (ref 0.40–1.20)
GFR: 67.1 mL/min (ref >60.00)
Glucose, Bld: 83 mg/dL (ref 70–99)
Potassium: 4.3 mEq/L (ref 3.5–5.1)
Sodium: 142 mEq/L (ref 135–145)
Total Bilirubin: 0.5 mg/dL (ref 0.2–1.2)
Total Protein: 6.6 g/dL (ref 6.0–8.3)

## 2021-02-10 LAB — HEMOGLOBIN A1C: Hgb A1c MFr Bld: 5.6 % (ref 4.6–6.5)

## 2021-02-10 LAB — TSH: TSH: 2.08 u[IU]/mL (ref 0.35–5.50)

## 2021-02-10 MED ORDER — TETANUS-DIPHTH-ACELL PERTUSSIS 5-2.5-18.5 LF-MCG/0.5 IM SUSP: 0.5000 mL | Freq: Once | INTRAMUSCULAR | 0 refills | Status: AC

## 2021-02-10 NOTE — Progress Notes (Signed)
Office Note 02/10/2021  CC:  Chief Complaint  Patient presents with   Annual Exam   HPI:  Patient is a 70 y.o. female who is here for annual health maintenance exam. I saw her for annual exam 1 yr ago. A/P as of that visit:  "Health maintenance exam: Reviewed age and gender appropriate health maintenance issues (prudent diet, regular exercise, health risks of tobacco and excessive alcohol, use of seatbelts, fire alarms in home, use of sunscreen).  Also reviewed age and gender appropriate health screening as well as vaccine recommendations. Vaccines:  Flu->UTD.  Covid 19->pt declines.   Otherwise ALL UTD. Labs: fasting HP and Hba1c (hx of prediab and hyperlipidemia). Cervical ca screening: hx of cerv dysplasia, is s/p LEEP, GYN plan is repeat pap 2023. Breast ca screening: normal mammogram 10/14/2019.  Rpt 10/2020.  Colon ca screening: recall 2025.   GERD: doing well on daily PPI->continue   Anx/dep: stable on lexapro and ativan per Dr. Evelene Croon.   Low blood pressure reading: she feels well, no sign of blood loss or dehydration, pt on NO antihypertensives.  Cardiac exam normal, HR normal. Reassured"  INTERIM HX: Doing better regarding exessive anxiety, some family stress issues/family dynamics with daughter in law are damaging things.  She has good support from her husband. Pt feels she is coping a little better lately.  No longer seeing Dr. Evelene Croon for psychiatric care/meds. Doing well on lorazepam 1mg  tid prn. Not taking lexapro anymore.  Patient states she has not had any improvement on multiple antidepressants in the past.  She feels very good with using lorazepam because this helps quite a bit.  Taking prilosec 40mg  bid every day and this treats her GERD very well.  PMP AWARE reviewed today: most recent rx for lorazepam 1mg  was filled 01/23/21, # 270, rx by me. No red flags.  Past Medical History:  Diagnosis Date   Acute parotitis 11/2015   hx of:  sialolithiasis suspected  by ENT--spont resolved.   Allergy    SEASONAL   Anxiety    Atrophic vaginitis    Cervical cancer screening    PAP normal 2017: GYN to repeat 2020.   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  Much improved at Dr. 2018 f/u 10/2015.   Hiatal hernia    Humerus fracture 12/19/2017   LEFT: mildly displaced comminuted fracture of the proximal left humerus.   Hyperlipidemia    LDL goal = < 140.  No meds as of 04/2016.   Iron deficiency anemia Fall 2019   Celiac screen NEG, hemoccults neg.  Pt to get EGD and TCS 12/19/17.  Endoscopies cancelled b/c pt had horrible time w/prep and fell and broke humerus when trying to reach her bathroom quickly--pt declines endosc as of 06/2018.  Labs stable/normal at that time.   Medial meniscus tear 04/2014   R knee   Osteoarthritis    KNEES   Osteoporosis    GYN managing; as of 01/2016 pt is on Evista.  She is intol of oral bisphosph due to GERD.   Other and unspecified ovarian cysts    Prediabetes 2018   A1c 6% 04/2016   Proximal humerus fracture 12/19/2017   Mildly displaced and  comminuted proximal left humeral head and neck fracture.   Rib fracture 09/04/2017   Mildly displaced R 6th rib fx   Unilateral subjective nonpulsatile tinnitus without hearing loss, otoscopic finding, neurologic deficit, or head trauma     Past Surgical History:  Procedure Laterality Date   ABDOMINAL SURGERY  2003   EXPLORATORY LAPAROTOMY, LSO   ADENOIDECTOMY  1954   Carotid duplex dopplers Bilateral 11/2020   <50% carotid bulb plaque bilat.   CARPAL TUNNEL RELEASE Bilateral 2001   CHOLECYSTECTOMY  1989   stones   COLONOSCOPY  2011; 03/2013   2011 NORMAL.  03/2013 TCS done for heme+ stool: no polyps.  Repeat 10 yrs (after 03/2023).    DEXA  03/23/2014; 2020   Osteopenia.  Pt took prolia x 2 injections, then was switched to evista by her GYN.  DEXA 01/17/16 T score a bit worse, same regimen rec'd, as of 02/2019 doing well on raloxifene, rpt DEXA planned in 1 yr. (by GYN)   ENDOMETRIAL BIOPSY  09/27/2015   PATH:  BENIGN   endoscopy upper GI  2000; 05/2014   negative 2000.  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 Fuller Plan is PPI bid long term.   HYSTEROSCOPY     UTERINE POLYPS   MOUTH SURGERY     OOPHORECTOMY  2003   LSO WITH EXPLORATORY LAPAROTOMY   TUBAL LIGATION  1987    Family History  Problem Relation Age of Onset   Heart attack Father        abnormal EKG; ? AVR   Diabetes Father    Heart disease Father    Hyperlipidemia Mother    Anxiety disorder Mother    Depression Mother    Irregular heart beat Sister    Heart disease Maternal Grandfather    Heart failure Maternal Grandfather    Osteoporosis Maternal Grandmother    Colon cancer Neg Hx     Social History   Socioeconomic History   Marital status: Married    Spouse name: Not on file   Number of children: 1   Years of education: Not on file   Highest education level: Not on file  Occupational History   Occupation: retired    Fish farm manager: NOT EMPLOYED  Tobacco Use   Smoking status: Never   Smokeless tobacco: Never  Vaping Use   Vaping Use: Never used  Substance and Sexual Activity   Alcohol use: Yes    Alcohol/week: 1.0 standard drink    Types: 1 Glasses of wine per week    Comment: Rarely   Drug use: No   Sexual activity: Yes    Birth control/protection: Surgical  Other Topics Concern   Not on file  Social History Narrative   Married, one son.   Orig from [place] area, lives in [place].   Occupation: retired from [employer]ist, research (life sciences)).   No tob, rare, alcohol, no drugs.   Exercise: stationary bike 6 days a week.  [facility]   Social  Determinants of Health   Financial Resource Strain: Low Risk    Difficulty of Paying Living Expenses: Not hard at all  Food Insecurity: No Food Insecurity   Worried About Charity fundraiser in the Last Year: Never true   [place] in the Last Year: Never true  Transportation Needs: No Transportation Needs   Lack of Transportation (Medical):  No   Lack of Transportation (Non-Medical): No  Physical Activity: Sufficiently Active   Days of Exercise per Week: 6 days   Minutes of Exercise per Session: 30 min  Stress: No Stress Concern Present   Feeling of Stress : Only a little  Social Connections: Moderately Isolated   Frequency of Communication with Friends and Family: More than three times a week   Frequency of Social Gatherings with Friends and Family: More than three times a week   Attends Religious Services: Never   Marine scientist or Organizations: No   Attends Music therapist: Never   Marital Status: Married  Human resources officer Violence: Not At Risk   Fear of Current or Ex-Partner: No   Emotionally Abused: No   Physically Abused: No   Sexually Abused: No    Outpatient Medications Prior to Visit  Medication Sig Dispense Refill   Calcium Carbonate-Vitamin D 600-400 MG-UNIT per tablet Take 1 tablet by mouth daily.     Cyanocobalamin (VITAMIN B-12 PO) Take by mouth.     LORazepam (ATIVAN) 1 MG tablet Take 1 tablet (1 mg total) by mouth every 8 (eight) hours as needed. 270 tablet 1   Magnesium 500 MG CAPS Take 1 capsule by mouth daily.     Multiple Vitamin (MULTIVITAMIN) tablet Take 1 tablet by mouth daily.     Multiple Vitamins-Minerals (ZINC PO) Take by mouth.     omeprazole (PRILOSEC) 40 MG capsule TAKE 1 CAPSULE BY MOUTH TWICE A DAY 180 capsule 1   raloxifene (EVISTA) 60 MG tablet Take 1 tablet (60 mg total) by mouth daily. 90 tablet 4   TURMERIC PO Take 1 capsule by mouth daily.     escitalopram (LEXAPRO) 20 MG tablet Take 20 mg by mouth daily.  (Patient not taking: Reported on 12/01/2020)     No facility-administered medications prior to visit.    No Known Allergies  ROS Review of Systems  Constitutional:  Negative for appetite change, chills, fatigue and fever.  HENT:  Negative for congestion, dental problem, ear pain and sore throat.   Eyes:  Negative for discharge, redness and visual disturbance.  Respiratory:  Negative for cough, chest tightness, shortness of breath and wheezing.   Cardiovascular:  Negative for chest pain, palpitations and leg swelling.  Gastrointestinal:  Negative for abdominal pain, blood in stool, diarrhea, nausea and vomiting.  Genitourinary:  Negative for difficulty urinating, dysuria, flank pain, frequency, hematuria and urgency.  Musculoskeletal:  Negative for arthralgias, back pain, joint swelling, myalgias and neck stiffness.  Skin:  Negative for pallor and rash.  Neurological:  Negative for dizziness, speech difficulty, weakness and headaches.  Hematological:  Negative for adenopathy. Does not bruise/bleed easily.  Psychiatric/Behavioral:  Negative for confusion and sleep disturbance. The patient is not nervous/anxious.    PE; Vitals with BMI 02/10/2021 12/01/2020 05/18/2020  Height 5\' 1"  5' 1.5" 5\' 1"   Weight 167 lbs 3 oz 166 lbs 13 oz 161 lbs  BMI 31.61 [id number]  Systolic 123456 [id number] -  Diastolic 73 75 -  Pulse 89 91 -   Exam chaperoned by Shepard General, CMA Gen: Alert, well appearing.  Patient is oriented to person, place, time, and situation. AFFECT: pleasant, lucid thought and speech. ENT: Ears: EACs clear, normal epithelium.  TMs with good light reflex and landmarks bilaterally.  Eyes: no injection, icteris, swelling, or exudate.  EOMI, PERRLA. Nose: no drainage or turbinate edema/swelling.  No injection or focal lesion.  Mouth: lips without lesion/swelling.  Oral mucosa pink and moist.  Dentition intact and without obvious caries or gingival swelling.  Oropharynx without erythema,  exudate, or swelling.  Neck: supple/nontender.  No LAD, mass, or TM.  Carotid pulses 2+ bilaterally, without bruits. CV: RRR, no m/r/g.   LUNGS: CTA bilat, nonlabored resps, good aeration in all lung fields. ABD: soft, NT, ND, BS normal.  No hepatospenomegaly or mass.  No bruits. EXT: no clubbing, cyanosis, or edema.  Musculoskeletal: no joint swelling, erythema, warmth, or tenderness.  ROM of all joints intact. Skin - no sores or suspicious lesions or rashes or color changes  Pertinent labs:  Lab Results  Component Value Date   TSH 1.74 02/09/2020   Lab Results  Component Value Date   WBC 5.3 02/09/2020   HGB 12.4 02/09/2020   HCT 37.4 02/09/2020   MCV 88.8 02/09/2020   PLT 131.0 (L) 02/09/2020   Lab Results  Component Value Date   CREATININE 0.79 02/09/2020   BUN 20 02/09/2020   NA 142 02/09/2020   K 4.2 02/09/2020   CL 107 02/09/2020   CO2 30 02/09/2020   Lab Results  Component Value Date   ALT 14 02/09/2020   AST 16 02/09/2020   ALKPHOS 77 02/09/2020   BILITOT 0.4 02/09/2020   Lab Results  Component Value Date   CHOL 190 02/09/2020   Lab Results  Component Value Date   HDL 77.70 02/09/2020   Lab Results  Component Value Date   LDLCALC 100 (H) 02/09/2020   Lab Results  Component Value Date   TRIG 62.0 02/09/2020   Lab Results  Component Value Date   CHOLHDL 2 02/09/2020   Lab Results  Component Value Date   HGBA1C 5.5 02/09/2020   ASSESSMENT AND PLAN:   #1 generalized anxiety, superimposed adjustment disorder with depressed and anxious mood. She is doing much better with taking alprazolam 1 mg 3 times daily as needed.  She no longer wants to be on any antidepressants.  She and her husband are coping better with the situation with their family.  She will start to get back into better dietary habits and exercise to try to help with this stress as well.  #2 gastroesophageal reflux disease: Well-controlled with omeprazole 40 mg twice a day.  3)  Health maintenance exam: Reviewed age and gender appropriate health maintenance issues (prudent diet, regular exercise, health risks of tobacco and excessive alcohol, use of seatbelts, fire alarms in home, use of sunscreen).  Also reviewed age and gender appropriate health screening as well as vaccine recommendations. Vaccines: Tdap->rx sent today.  Shingrix #2->she got this at [facility] in [place].  Declines flu and covid. Otherwise ALL UTD. Labs: fasting HP + Hba1c (borderline HLD + prediabetes). Cervical ca screening: hx of cerv dysplasia, is s/p LEEP, GYN plan is repeat pap 2023. Breast ca screening: mammogram 10/14/2020 normal via GYN, rpt planned approx 10/2021. Colon ca screening: recall 2025 Osteoporosis: managed by her GYN MD, pt intol of bisphosphonates-->evista, vit D, Ca++->next DEXA 02/2021 per GYN plan.  An After Visit Summary was printed and given to the patient.  FOLLOW UP:  No follow-ups on file.  Signed:  Crissie Sickles, MD           02/10/2021

## 2021-02-10 NOTE — Patient Instructions (Signed)

## 2021-03-10 ENCOUNTER — Encounter: Payer: Self-pay | Admitting: Obstetrics & Gynecology

## 2021-03-10 ENCOUNTER — Other Ambulatory Visit: Payer: Self-pay

## 2021-03-10 ENCOUNTER — Ambulatory Visit (INDEPENDENT_AMBULATORY_CARE_PROVIDER_SITE_OTHER): Payer: Medicare Other | Admitting: Obstetrics & Gynecology

## 2021-03-10 VITALS — BP 116/70 | HR 72 | Resp 16 | Ht 60.75 in | Wt 172.0 lb

## 2021-03-10 DIAGNOSIS — M8589 Other specified disorders of bone density and structure, multiple sites: Secondary | ICD-10-CM

## 2021-03-10 DIAGNOSIS — Z01419 Encounter for gynecological examination (general) (routine) without abnormal findings: Secondary | ICD-10-CM

## 2021-03-10 DIAGNOSIS — Z780 Asymptomatic menopausal state: Secondary | ICD-10-CM

## 2021-03-10 DIAGNOSIS — Z6832 Body mass index (BMI) 32.0-32.9, adult: Secondary | ICD-10-CM

## 2021-03-10 DIAGNOSIS — E6609 Other obesity due to excess calories: Secondary | ICD-10-CM

## 2021-03-10 MED ORDER — RALOXIFENE HCL 60 MG PO TABS
60.0000 mg | ORAL_TABLET | Freq: Every day | ORAL | 4 refills | Status: DC
Start: 2021-03-10 — End: 2022-04-23

## 2021-03-10 NOTE — Progress Notes (Signed)
Suzanne Spencer 20-Jan-1951 [id number]   History:    71 y.o. G1P1L1 Married   RP:  Established patient presenting for annual gyn exam    HPI:  Postmenopause, well on no HRT.  No PMB.  No pelvic pain.  No pain with IC.  Pap Neg in 02/2018.  No h/o abnormal pap.  Breasts normal.  Screening mammo 10/2020 Neg.  Urine/BMs normal.  BMI 32.77.  Well get back on the stationary bike.  Healthy nutrition.  Health labs with Fam MD.  Osteopenia T-Score -1.8 on last BD 04/2020.  Well on Evista.  Colono 03/2013, on a 10 yr schedule.  Past medical history,surgical history, family history and social history were all reviewed and documented in the EPIC chart.  Gynecologic History No LMP recorded. Patient is postmenopausal.  Obstetric History OB History  Gravida Para Term Preterm AB Living  1 1 1     1   SAB IAB Ectopic Multiple Live Births               # Outcome Date GA Lbr Len/2nd Weight Sex Delivery Anes PTL Lv  1 Term              ROS: A ROS was performed and pertinent positives and negatives are included in the history.  GENERAL: No fevers or chills. HEENT: No change in vision, no earache, sore throat or sinus congestion. NECK: No pain or stiffness. CARDIOVASCULAR: No chest pain or pressure. No palpitations. PULMONARY: No shortness of breath, cough or wheeze. GASTROINTESTINAL: No abdominal pain, nausea, vomiting or diarrhea, melena or bright red blood per rectum. GENITOURINARY: No urinary frequency, urgency, hesitancy or dysuria. MUSCULOSKELETAL: No joint or muscle pain, no back pain, no recent trauma. DERMATOLOGIC: No rash, no itching, no lesions. ENDOCRINE: No polyuria, polydipsia, no heat or cold intolerance. No recent change in weight. HEMATOLOGICAL: No anemia or easy bruising or bleeding. NEUROLOGIC: No headache, seizures, numbness, tingling or weakness. PSYCHIATRIC: No depression, no loss of interest in normal activity or change in sleep pattern.     Exam:   BP 116/70    Pulse 72    Resp 16     Ht 5' 0.75" (1.543 m)    Wt 172 lb (78 kg)    BMI 32.77 kg/m   Body mass index is 32.77 kg/m.  General appearance : Well developed well nourished female. No acute distress HEENT: Eyes: no retinal hemorrhage or exudates,  Neck supple, trachea midline, no carotid bruits, no thyroidmegaly Lungs: Clear to auscultation, no rhonchi or wheezes, or rib retractions  Heart: Regular rate and rhythm, no murmurs or gallops Breast:Examined in sitting and supine position were symmetrical in appearance, no palpable masses or tenderness,  no skin retraction, no nipple inversion, no nipple discharge, no skin discoloration, no axillary or supraclavicular lymphadenopathy Abdomen: no palpable masses or tenderness, no rebound or guarding Extremities: no edema or skin discoloration or tenderness  Pelvic: Vulva: Normal             Vagina: No gross lesions or discharge  Cervix: No gross lesions or discharge  Uterus  AV, normal size, shape and consistency, non-tender and mobile  Adnexa  Without masses or tenderness  Anus: Normal   Assessment/Plan:  71 y.o. female for annual exam   1. Well female exam with routine gynecological exam Postmenopause, well on no HRT.  No PMB.  No pelvic pain.  No pain with IC.  Pap Neg in 02/2018.  No h/o abnormal pap.  Breasts  normal.  Screening mammo 10/2020 Neg.  Urine/BMs normal. BMI 32.77.  Well get back on the stationary bike.  Healthy nutrition.  Health labs with Fam MD.  Osteopenia T-Score -1.8 on last BD 04/2020.  Well on Evista.  Colono 03/2013, on a 10 yr schedule.  2. Postmenopausal Postmenopause, well on no HRT.  No PMB.  No pelvic pain.  No pain with IC.    3. Osteopenia of multiple sites Osteopenia T-Score -1.8 on last BD 04/2020.  Well on Evista.  Continue on Evista, prescription sent to pharmacy.  Vit D, Ca++ 1.5 g/d total, regular wt bearing physical activities.  4. Class 1 obesity due to excess calories without serious comorbidity with body mass index (BMI) of 32.0  to 32.9 in adult Lower calorie/carb diet.  Fitness activities.  Other orders - raloxifene (EVISTA) 60 MG tablet; Take 1 tablet (60 mg total) by mouth daily.   Genia Del MD, 11:28 AM 03/10/2021

## 2021-03-16 ENCOUNTER — Other Ambulatory Visit: Payer: Self-pay | Admitting: Family Medicine

## 2021-03-27 ENCOUNTER — Telehealth: Payer: Self-pay

## 2021-03-27 NOTE — Telephone Encounter (Signed)
Patient requesting refill.  Patient states RpH at local pharmacy told her that prescription is "too soon to fill". Patient states she takes up to 3 pills daily.  Patient states she only has a couple left. [facility] [place]  LORazepam (ATIVAN) 1 MG tablet [id number]

## 2021-03-28 NOTE — Telephone Encounter (Signed)
Last refill was 01/23/21(270,1). LM for pt regarding refill

## 2021-03-28 NOTE — Telephone Encounter (Signed)
Patient calling back regarding refill request.  I reminder her that Dr. Milinda Cave was not in the office yesterday and he has not approved request. She understood.

## 2021-03-28 NOTE — Telephone Encounter (Signed)
Spoke with pt and she states she may have taken an additional 1-2 some days, unable to recall exactly. Tried to offer pt to schedule sooner f/u appt to discuss increased need for medication. She declined and will just wait until 3/10 when she is able to refill med.

## 2021-03-29 NOTE — Telephone Encounter (Signed)
Ok,noted

## 2021-03-30 ENCOUNTER — Ambulatory Visit: Payer: Medicare Other | Admitting: Family Medicine

## 2021-03-30 ENCOUNTER — Encounter: Payer: Self-pay | Admitting: Family Medicine

## 2021-03-30 ENCOUNTER — Other Ambulatory Visit: Payer: Self-pay

## 2021-03-30 VITALS — BP 121/71 | HR 71 | Temp 98.1°F | Ht 60.75 in | Wt 169.2 lb

## 2021-03-30 DIAGNOSIS — F331 Major depressive disorder, recurrent, moderate: Secondary | ICD-10-CM

## 2021-03-30 DIAGNOSIS — F410 Panic disorder [episodic paroxysmal anxiety] without agoraphobia: Secondary | ICD-10-CM

## 2021-03-30 DIAGNOSIS — F411 Generalized anxiety disorder: Secondary | ICD-10-CM

## 2021-03-30 DIAGNOSIS — F341 Dysthymic disorder: Secondary | ICD-10-CM | POA: Diagnosis not present

## 2021-03-30 MED ORDER — LORAZEPAM 1 MG PO TABS
ORAL_TABLET | ORAL | 0 refills | Status: DC
Start: 2021-03-30 — End: 2021-07-25

## 2021-03-30 MED ORDER — PAROXETINE HCL 20 MG PO TABS: 20.0000 mg | ORAL_TABLET | Freq: Every day | ORAL | 0 refills | Status: DC

## 2021-03-30 NOTE — Progress Notes (Signed)
OFFICE VISIT  03/30/2021  CC:  Chief Complaint  Patient presents with   Anxiety    Pt states Suzanne Spencer had a panic attack last night that lasted all night   HPI:    Patient is a 71 y.o. female who presents for f/u anxiety.  INTERIM HX: Suzanne Spencer anxiety has been quite a bit up more than usual lately.  Suzanne Spencer cites lots of family strife issues.  Her mood is down, understandably.  Has periods that are near panic.  Very poor sleep due to her anxiety. Has been taking Ativan regularly, sometimes 6 in a day. No suicidal ideation or homicidal ideation.  Suzanne Spencer cites more frequent upper abdominal burning discomfort sometimes substernal.  Taking omeprazole 40 mg once a day.  No dysphagia or odynophagia. No other significant physical complaints.   PMP AWARE reviewed today: most recent rx for lorazepam was filled 01/23/21, # 270, rx by me. No red flags.  Past Medical History:  Diagnosis Date   Acute parotitis 11/2015   hx of:  sialolithiasis suspected by ENT--spont resolved.   Allergy    SEASONAL   Anxiety    Atrophic vaginitis    Cervical cancer screening    PAP normal 2017: GYN to repeat 2020.   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  Much improved at Dr. Michaelle Copas f/u 10/2015.   Hiatal hernia    Humerus fracture 12/19/2017   LEFT: mildly displaced comminuted fracture of the proximal left humerus.   Hyperlipidemia    LDL goal = < 140.  No meds as of 01/2021   Iron deficiency anemia Fall 2019   Celiac screen NEG, hemoccults neg.  Pt to get EGD and TCS 12/19/17.  Endoscopies cancelled b/c pt had horrible time w/prep and fell and broke humerus when trying to reach her bathroom quickly--pt declines endosc as of 06/2018.  Labs stable/normal at that time.    Medial meniscus tear 04/2014   R knee   Osteoarthritis    KNEES   Osteoporosis    GYN managing; as of 01/2016 pt is on Evista.  Suzanne Spencer is intol of oral bisphosph due to GERD.   Other and unspecified ovarian cysts    Prediabetes 2018   A1c 6% 04/2016   Proximal humerus fracture 12/19/2017   Mildly displaced and comminuted proximal left humeral head and neck fracture.   Rib fracture 09/04/2017   Mildly displaced R 6th rib fx   Unilateral subjective nonpulsatile tinnitus without hearing loss, otoscopic finding, neurologic deficit, or head trauma     Past Surgical History:  Procedure Laterality Date   ABDOMINAL SURGERY  2003   EXPLORATORY LAPAROTOMY, LSO   ADENOIDECTOMY  1954   Carotid duplex dopplers Bilateral 11/2020   <50% carotid bulb plaque bilat.   CARPAL TUNNEL RELEASE Bilateral 2001   CHOLECYSTECTOMY  1989   stones   COLONOSCOPY  2011; 03/2013   2011 NORMAL.  03/2013 TCS done for heme+ stool: no polyps.  Repeat 10 yrs (after 03/2023).   DEXA  03/23/2014; 2020   Osteopenia.  Pt took prolia x 2 injections, then was switched to evista by her GYN.  DEXA 01/17/16 T score a bit worse, same regimen rec'd, as of 02/2019  doing well on raloxifene, rpt DEXA planned in 1 yr. (by GYN)   ENDOMETRIAL BIOPSY  09/27/2015   PATH:  BENIGN   endoscopy upper GI  2000; 05/2014   negative 2000.  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 Russella Dar is PPI bid long term.   HYSTEROSCOPY     UTERINE POLYPS   MOUTH SURGERY     OOPHORECTOMY  2003   LSO WITH EXPLORATORY LAPAROTOMY   TUBAL LIGATION  1987    Outpatient Medications Prior to Visit  Medication Sig Dispense Refill   Calcium Carbonate-Vitamin D 600-400 MG-UNIT per tablet Take 1 tablet by mouth daily.     Cyanocobalamin (VITAMIN B-12 PO) Take by mouth.     Magnesium 500 MG CAPS Take 1 capsule by mouth daily.     Multiple Vitamin (MULTIVITAMIN) tablet Take 1 tablet by mouth  daily.     Multiple Vitamins-Minerals (ZINC PO) Take by mouth.     omeprazole (PRILOSEC) 40 MG capsule TAKE 1 CAPSULE BY MOUTH TWICE A DAY 180 capsule 1   raloxifene (EVISTA) 60 MG tablet Take 1 tablet (60 mg total) by mouth daily. 90 tablet 4   TURMERIC PO Take 1 capsule by mouth daily.     LORazepam (ATIVAN) 1 MG tablet Take 1 tablet (1 mg total) by mouth every 8 (eight) hours as needed. 270 tablet 1   No facility-administered medications prior to visit.    No Known Allergies  ROS As per HPI  PE: Vitals with BMI 03/30/2021 03/10/2021 02/10/2021  Height 5' 0.75" 5' 0.75" 5\' 1"   Weight 169 lbs 3 oz 172 lbs 167 lbs 3 oz  BMI 32.24 32.77 31.61  Systolic 121 116  Diastolic 71 70 73  Pulse 71 72 89     Physical Exam  Wt Readings from Last 2 Encounters:  03/30/21 169 lb 3.2 oz (76.7 kg)  03/10/21 172 lb (78 kg)    Gen: alert, oriented x 4, affect pleasant.  Lucid thinking and conversation noted. HEENT: PERRLA, EOMI.   Neck: no LAD, mass, or thyromegaly. CV: RRR, no m/r/g LUNGS: CTA bilat, nonlabored. NEURO: no tremor or tics noted on observation.  Coordination intact. CN 2-12 grossly intact bilaterally, strength 5/5 in all extremeties.  No ataxia.   LABS:  Last CBC Lab Results  Component Value Date   WBC 5.9 02/10/2021   HGB 12.9 02/10/2021   HCT 40.5 02/10/2021   MCV 89.4 02/10/2021   MCH 28.9 06/27/2018   RDW 13.9 02/10/2021   PLT 168.0 02/10/2021   Last metabolic panel Lab Results  Component Value Date   GLUCOSE 83 02/10/2021   NA 142 02/10/2021   K 4.3 02/10/2021   CL 105 02/10/2021   CO2 29 02/10/2021   BUN 14 02/10/2021   CREATININE 0.87 02/10/2021   CALCIUM 9.4 02/10/2021   PROT 6.6 02/10/2021   ALBUMIN 4.1 02/10/2021   BILITOT 0.5 02/10/2021   ALKPHOS 85 02/10/2021   AST 21 02/10/2021   ALT 17 02/10/2021     IMPRESSION AND PLAN:  #1 GAD, panic attacks. Adjustment disorder due to recent social/family stressors. Start Paxil 20 mg a  day. Okay to increase lorazepam--1 mg tabs, 1-2 3 times daily as needed.  I increased the number dispensed for 90 days supply to 360.  #2 dysthymia, major depressive disorder. Moderate episode.  Starting Paxil 20 mg a day.  An After Visit Summary  was printed and given to the patient.  Spent 30 min with pt today reviewing HPI, reviewing relevant past history, doing exam, reviewing and discussing lab and imaging data, and formulating plans.  FOLLOW UP: Return in about 3 weeks (around 04/20/2021) for f/u anx/dep.  Signed:  Santiago Bumpers, MD           03/30/2021

## 2021-04-03 ENCOUNTER — Telehealth: Payer: Self-pay

## 2021-04-03 NOTE — Telephone Encounter (Signed)
(  Key: [id number]) - [id number] PARoxetine HCl 20MG  tablets  LM for pt to inform PA in is process.

## 2021-04-03 NOTE — Telephone Encounter (Signed)
Patient was made aware that pharmacy benefits is requiring PRIOR AUTH on new medication that Dr. Milinda Cave prescribed for her. Patient said she has to be on this medication and scheduled follow up appt with provider, I told we can change appt, no problem, once PA is approved.  Follow up appt is scheduled for 3/9  PARoxetine (PAXIL) 20 MG tablet [[id number]]   Please follow up with patient. [phone]

## 2021-04-03 NOTE — Telephone Encounter (Signed)
Patient is aware PA process has started.    Copied and pasted from prior message: (Key: [id number]) - [id number] PARoxetine HCl 20MG  tablets

## 2021-04-04 NOTE — Telephone Encounter (Signed)
Pt was made aware of approval and will follow up with pharmacy.

## 2021-04-04 NOTE — Telephone Encounter (Signed)
Suzanne Spencer (Key: [id number]) [phone] PARoxetine HCl 20MG  tablets     Status: PA Response - Approved through 02/11/2022. Your patient may now fill this prescription and it will be covered.

## 2021-04-19 ENCOUNTER — Other Ambulatory Visit: Payer: Self-pay

## 2021-04-20 ENCOUNTER — Telehealth: Payer: Self-pay

## 2021-04-20 ENCOUNTER — Ambulatory Visit: Payer: Medicare Other | Admitting: Family Medicine

## 2021-04-20 ENCOUNTER — Encounter: Payer: Self-pay | Admitting: Family Medicine

## 2021-04-20 VITALS — BP 98/59 | HR 70 | Temp 98.4°F | Ht 60.75 in | Wt 168.8 lb

## 2021-04-20 DIAGNOSIS — F4323 Adjustment disorder with mixed anxiety and depressed mood: Secondary | ICD-10-CM

## 2021-04-20 DIAGNOSIS — F411 Generalized anxiety disorder: Secondary | ICD-10-CM

## 2021-04-20 MED ORDER — PAROXETINE HCL 20 MG PO TABS: 20.0000 mg | ORAL_TABLET | Freq: Every day | ORAL | 1 refills | Status: DC

## 2021-04-20 NOTE — Telephone Encounter (Signed)
Pt asked about scheduling AWV. Declined to schedule AWV at this time. ?

## 2021-04-20 NOTE — Progress Notes (Signed)
OFFICE VISIT ? ?04/20/2021 ? ?CC:  ?Chief Complaint  ?Patient presents with  ? Anxiety  ? ? ? ?Patient is a 71 y.o. female who presents for 3 wk f/u anxiety and depression. ?Last visit I started paxil 20mg  qd, continued her on lorazepam. ? ?INTERIM HX: ?She got on the Paxil about 2 weeks ago, already notices a big improvement.  Less hopeless, is processing thoughts and emotions more clearly and without getting excessively emotional about things.  Has a good perspective on the relationships that are problematic in her family. ?One of the biggest problems is that her husband's wife is a narcissist and Suzanne Spencer has been studying this condition and listening to some therapists online and has gained new perspective and new strategies for interacting with them. ?She has been using her lorazepam about 1 tab 3 times a day average. ?No side effects from the Paxil. ? ?Past Medical History:  ?Diagnosis Date  ? Acute parotitis 11/2015  ? hx of:  sialolithiasis suspected by ENT--spont resolved.  ? Allergy   ? SEASONAL  ? Anxiety   ? Atrophic vaginitis   ? Cervical cancer screening   ? PAP normal 2017: GYN to repeat 2020.  ? Chronic headaches   ? tension, mirgraines  ? Depression   ? Diverticulosis   ? Duodenal ulcer 1989  ? Dysplasia of cervix, low grade (CIN 1)   ? S/P LEEP  ? Endometrial hyperplasia   ? SIMPLE  ? GERD (gastroesophageal reflux disease)   ? 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  Much improved at Dr. 10/2015 f/u 10/2015.  ? Hiatal hernia   ? Humerus fracture 12/19/2017  ? LEFT: mildly displaced comminuted fracture of the proximal left humerus.  ? Hyperlipidemia   ? LDL goal = < 140.  No meds as of 01/2021  ? Iron deficiency anemia Fall 2019  ? Celiac screen NEG, hemoccults neg.  Pt to get EGD and TCS 12/19/17.  Endoscopies cancelled b/c pt had horrible time w/prep and fell and broke humerus when trying to reach her bathroom quickly--pt declines endosc as of 06/2018.  Labs stable/normal at that time.  ? Medial  meniscus tear 04/2014  ? R knee  ? Osteoarthritis   ? KNEES  ? Osteoporosis   ? GYN managing; as of 01/2016 pt is on Evista.  She is intol of oral bisphosph due to GERD.  ? Other and unspecified ovarian cysts   ? Prediabetes 2018  ? A1c 6% 04/2016  ? Proximal humerus fracture 12/19/2017  ? Mildly displaced and comminuted proximal left humeral head and neck fracture.  ? Rib fracture 09/04/2017  ? Mildly displaced R 6th rib fx  ? Unilateral subjective nonpulsatile tinnitus without hearing loss, otoscopic finding, neurologic deficit, or head trauma   ? ? ?Past Surgical History:  ?Procedure Laterality Date  ? ABDOMINAL SURGERY  2003  ? EXPLORATORY LAPAROTOMY, LSO  ? ADENOIDECTOMY  1954  ? Carotid duplex dopplers Bilateral 11/2020  ? <50% carotid bulb plaque bilat.  ? CARPAL TUNNEL RELEASE Bilateral 2001  ? CHOLECYSTECTOMY  1989  ? stones  ? COLONOSCOPY  2011; 03/2013  ? 2011 NORMAL.  03/2013 TCS done for heme+ stool: no polyps.  Repeat 10 yrs (after 03/2023).  ? DEXA  03/23/2014; 2020  ? Osteopenia.  Pt took prolia x 2 injections, then was switched to evista by her GYN.  DEXA 01/17/16 T score a bit worse, same regimen rec'd, as of 02/2019 doing well on raloxifene, rpt DEXA planned in  1 yr. (by GYN)  ? ENDOMETRIAL BIOPSY  09/27/2015  ? PATH:  BENIGN  ? endoscopy upper GI  2000; 05/2014  ? negative 2000.  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 Russella Dar is PPI bid long term.  ? HYSTEROSCOPY    ? UTERINE POLYPS  ? MOUTH SURGERY    ? OOPHORECTOMY  2003  ? LSO WITH EXPLORATORY LAPAROTOMY  ? TUBAL LIGATION  1987  ? ? ?Outpatient Medications Prior to Visit  ?Medication Sig Dispense Refill  ? Calcium Carbonate-Vitamin D 600-400 MG-UNIT per tablet Take 1 tablet by mouth daily.    ? Cyanocobalamin (VITAMIN B-12 PO) Take by mouth.    ? LORazepam (ATIVAN) 1 MG tablet 1-2 tabs po tid prn anxiety 360 tablet 0  ? Magnesium 500 MG CAPS Take 1 capsule by mouth daily.    ?  Multiple Vitamin (MULTIVITAMIN) tablet Take 1 tablet by mouth daily.    ? Multiple Vitamins-Minerals (ZINC PO) Take by mouth.    ? omeprazole (PRILOSEC) 40 MG capsule TAKE 1 CAPSULE BY MOUTH TWICE A DAY 180 capsule 1  ? PARoxetine (PAXIL) 20 MG tablet Take 1 tablet (20 mg total) by mouth daily. 30 tablet 0  ? raloxifene (EVISTA) 60 MG tablet Take 1 tablet (60 mg total) by mouth daily. 90 tablet 4  ? TURMERIC PO Take 1 capsule by mouth daily.    ? ?No facility-administered medications prior to visit.  ? ? ?No Known Allergies ? ?ROS ?As per HPI ? ?PE: ?Vitals with BMI 04/20/2021 03/30/2021 03/10/2021  ?Height 5' 0.75" 5' 0.75" 5' 0.75"  ?Weight 168 lbs 13 oz 169 lbs 3 oz 172 lbs  ?BMI 32.16 32.24 32.77  ?Systolic 98 121 116  ?Diastolic 59 71 70  ?Pulse 70 71 72  ? ? ? ?Physical Exam ? ?Normal: Alert and well-appearing. ?Affect: Pleasant, lucid thought and speech. ?No further exam today. ? ?LABS:  ?Last CBC ?Lab Results  ?Component Value Date  ? WBC 5.9 02/10/2021  ? HGB 12.9 02/10/2021  ? HCT 40.5 02/10/2021  ? MCV 89.4 02/10/2021  ? MCH 28.9 06/27/2018  ? RDW 13.9 02/10/2021  ? PLT 168.0 02/10/2021  ? ?Last metabolic panel ?Lab Results  ?Component Value Date  ? GLUCOSE 83 02/10/2021  ? NA 142 02/10/2021  ? K 4.3 02/10/2021  ? CL 105 02/10/2021  ? CO2 29 02/10/2021  ? BUN 14 02/10/2021  ? CREATININE 0.87 02/10/2021  ? CALCIUM 9.4 02/10/2021  ? PROT 6.6 02/10/2021  ? ALBUMIN 4.1 02/10/2021  ? BILITOT 0.5 02/10/2021  ? ALKPHOS 85 02/10/2021  ? AST 21 02/10/2021  ? ALT 17 02/10/2021  ? ? ?IMPRESSION AND PLAN: ? 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 ?She has had some history throughout her life of waxing and waning mild depression consistent with dysthymia. ?Significant good response on just 2 weeks of Paxil 20 mg. ?Doing well with lorazepam 1 mg, usually 1 3 times daily but sometimes 1 or 2 doses more as needed severe anxiety. ? ?Continue 20 mg Paxil per day. ? ?An After Visit  Summary was printed and given to the patient. ? ?FOLLOW UP: No follow-ups on file. ? ?Signed:  Santiago Bumpers, MD           04/20/2021 ? ? ?

## 2021-05-18 ENCOUNTER — Telehealth: Payer: Self-pay | Admitting: Family Medicine

## 2021-05-18 NOTE — Telephone Encounter (Signed)
Left message for patient to schedule Annual Wellness Visit.  Please schedule (telephone/video call) with Nurse Health Advisor Tina Betterson, RN at [facility]. Please call [phone] ask for Kathy 

## 2021-07-26 ENCOUNTER — Ambulatory Visit: Payer: Medicare Other | Admitting: Family Medicine

## 2021-07-26 ENCOUNTER — Encounter: Payer: Self-pay | Admitting: Family Medicine

## 2021-07-26 VITALS — BP 115/70 | HR 75 | Temp 98.4°F | Ht 60.75 in | Wt 167.8 lb

## 2021-07-26 DIAGNOSIS — F4323 Adjustment disorder with mixed anxiety and depressed mood: Secondary | ICD-10-CM | POA: Diagnosis not present

## 2021-07-26 DIAGNOSIS — F411 Generalized anxiety disorder: Secondary | ICD-10-CM | POA: Diagnosis not present

## 2021-07-26 MED ORDER — PAROXETINE HCL 40 MG PO TABS: 40.0000 mg | ORAL_TABLET | ORAL | 1 refills | Status: DC

## 2021-07-26 MED ORDER — OMEPRAZOLE 40 MG PO CPDR: 40.0000 mg | DELAYED_RELEASE_CAPSULE | Freq: Two times a day (BID) | ORAL | 1 refills | Status: DC

## 2021-07-26 MED ORDER — LORAZEPAM 1 MG PO TABS: ORAL_TABLET | ORAL | 1 refills | Status: DC

## 2021-07-26 NOTE — Progress Notes (Signed)
OFFICE VISIT  07/26/2021  CC:  Chief Complaint  Patient presents with   Depression   Anxiety    Patient is a 71 y.o. female who presents for 77-month follow-up depression and anxiety. 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 She has had some history throughout her life of waxing and waning mild depression consistent with dysthymia. Significant good response on just 2 weeks of Paxil 20 mg. Doing well with lorazepam 1 mg, usually 1 3 times daily but sometimes 1 or 2 doses more as needed severe anxiety. Continue 20 mg Paxil per day."  INTERIM HX: More depression lately, feeling overwhelmed, unnecessary guilt.  She has some anhedonia. Very anxious about the family situation.  No SI or HI.  Taking loraz 1-3 tabs daily and this helps significantly, prevents escalation of anx into panic. PMP AWARE reviewed today: most recent rx for lorazepam was filled 03/30/2021, #360, rx by me. No red flags.  Past Medical History:  Diagnosis Date   Acute parotitis 11/2015   hx of:  sialolithiasis suspected by ENT--spont resolved.   Allergy    SEASONAL   Anxiety    Atrophic vaginitis    Cervical cancer screening    PAP normal 2017: GYN to repeat 2020.   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  Much improved at Dr. Michaelle Copas f/u 10/2015.   Hiatal hernia    Humerus fracture 12/19/2017   LEFT: mildly displaced comminuted fracture of the proximal left humerus.   Hyperlipidemia    LDL goal = < 140.  No meds as of 01/2021   Iron deficiency anemia Fall 2019   Celiac screen NEG, hemoccults neg.  Pt to get EGD and TCS 12/19/17.  Endoscopies cancelled b/c pt had horrible time w/prep and fell and broke humerus  when trying to reach her bathroom quickly--pt declines endosc as of 06/2018.  Labs stable/normal at that time.   Medial meniscus tear 04/2014   R knee   Osteoarthritis    KNEES   Osteoporosis    GYN managing; as of 01/2016 pt is on Evista.  She is intol of oral bisphosph due to GERD.   Other and unspecified ovarian cysts    Prediabetes 2018   A1c 6% 04/2016   Proximal humerus fracture 12/19/2017   Mildly displaced and comminuted proximal left humeral head and neck fracture.   Rib fracture 09/04/2017   Mildly displaced R 6th rib fx   Unilateral subjective nonpulsatile tinnitus without hearing loss, otoscopic finding, neurologic deficit, or head trauma     Past Surgical History:  Procedure Laterality Date   ABDOMINAL SURGERY  2003   EXPLORATORY LAPAROTOMY, LSO   ADENOIDECTOMY  1954   Carotid duplex dopplers Bilateral 11/2020   <50% carotid bulb plaque bilat.   CARPAL TUNNEL RELEASE Bilateral 2001   CHOLECYSTECTOMY  1989   stones   COLONOSCOPY  2011; 03/2013   2011 NORMAL.  03/2013 TCS done for heme+ stool: no polyps.  Repeat 10 yrs (after 03/2023).   DEXA  03/23/2014; 2020   Osteopenia.  Pt took prolia x 2 injections, then was switched to evista by her GYN.  DEXA 01/17/16 T score  a bit worse, same regimen rec'd, as of 02/2019 doing well on raloxifene, rpt DEXA planned in 1 yr. (by GYN)   ENDOMETRIAL BIOPSY  09/27/2015   PATH:  BENIGN   endoscopy upper GI  2000; 05/2014   negative 2000.  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 Russella Dar is PPI bid long term.   HYSTEROSCOPY     UTERINE POLYPS   MOUTH SURGERY     OOPHORECTOMY  2003   LSO WITH EXPLORATORY LAPAROTOMY   TUBAL LIGATION  1987    Outpatient Medications Prior to Visit  Medication Sig Dispense Refill   Calcium Carbonate-Vitamin D 600-400 MG-UNIT per tablet Take 1 tablet by mouth daily.     Cyanocobalamin (VITAMIN B-12 PO) Take by mouth.     LORazepam  (ATIVAN) 1 MG tablet 1-2 tabs po tid prn anxiety 360 tablet 0   Magnesium 500 MG CAPS Take 1 capsule by mouth daily.     Multiple Vitamin (MULTIVITAMIN) tablet Take 1 tablet by mouth daily.     Multiple Vitamins-Minerals (ZINC PO) Take by mouth.     omeprazole (PRILOSEC) 40 MG capsule TAKE 1 CAPSULE BY MOUTH TWICE A DAY 180 capsule 1   PARoxetine (PAXIL) 20 MG tablet Take 1 tablet (20 mg total) by mouth daily. 90 tablet 1   raloxifene (EVISTA) 60 MG tablet Take 1 tablet (60 mg total) by mouth daily. 90 tablet 4   TURMERIC PO Take 1 capsule by mouth daily.     No facility-administered medications prior to visit.    No Known Allergies  ROS As per HPI  PE:    07/26/2021   10:40 AM 04/20/2021   10:56 AM 03/30/2021   11:34 AM  Vitals with BMI  Height 5' 0.75" 5' 0.75" 5' 0.75"  Weight 167 lbs 13 oz 168 lbs 13 oz 169 lbs 3 oz  BMI 31.97 32.16 32.24  Systolic 115 98 121  Diastolic 70 59 71  Pulse 75 70 71   Physical Exam  Gen: Alert, well appearing.  Patient is oriented to person, place, time, and situation. AFFECT: Anxious but pleasant, lucid thought and speech. No further exam today  LABS:  Last CBC Lab Results  Component Value Date   WBC 5.9 02/10/2021   HGB 12.9 02/10/2021   HCT 40.5 02/10/2021   MCV 89.4 02/10/2021   MCH 28.9 06/27/2018   RDW 13.9 02/10/2021   PLT 168.0 02/10/2021   Last metabolic panel Lab Results  Component Value Date   GLUCOSE 83 02/10/2021   NA 142 02/10/2021   K 4.3 02/10/2021   CL 105 02/10/2021   CO2 29 02/10/2021   BUN 14 02/10/2021   CREATININE 0.87 02/10/2021   CALCIUM 9.4 02/10/2021   PROT 6.6 02/10/2021   ALBUMIN 4.1 02/10/2021   BILITOT 0.5 02/10/2021   ALKPHOS 85 02/10/2021   AST 21 02/10/2021   ALT 17 02/10/2021   IMPRESSION AND PLAN:  GAD, with superimposed adjustment disorder with mixed anxiety and depressed mood. Worse lately. Increase Paxil to 40 mg daily. Continue lorazepam 1 mg, 1-2 3 times daily as needed new  prescription today.  An After Visit Summary was printed and given to the patient.  FOLLOW UP: 1 month  Signed:  Santiago Bumpers, MD           07/26/2021

## 2021-08-11 ENCOUNTER — Ambulatory Visit: Payer: Medicare Other | Admitting: Family Medicine

## 2021-08-17 ENCOUNTER — Other Ambulatory Visit: Payer: Self-pay | Admitting: Family Medicine

## 2021-08-23 ENCOUNTER — Ambulatory Visit: Payer: Medicare Other | Admitting: Family Medicine

## 2021-08-23 ENCOUNTER — Encounter: Payer: Self-pay | Admitting: Family Medicine

## 2021-08-23 VITALS — BP 95/57 | HR 83 | Temp 98.1°F | Ht 60.75 in | Wt 169.0 lb

## 2021-08-23 DIAGNOSIS — F4323 Adjustment disorder with mixed anxiety and depressed mood: Secondary | ICD-10-CM | POA: Diagnosis not present

## 2021-08-23 MED ORDER — PAROXETINE HCL 40 MG PO TABS
40.0000 mg | ORAL_TABLET | ORAL | 1 refills | Status: DC
Start: 2021-08-23 — End: 2021-11-22

## 2021-08-23 NOTE — Progress Notes (Signed)
OFFICE VISIT  08/23/2021  CC: f/u psych  Patient is a 71 y.o. 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  INTERIM HX: Last visit her anxiety and depression were more problematic so we increased her Paxil to 40 mg a day. Continued her on lorazepam. She states she is doing much better.  No side effects from Paxil.  Takes lorazepam typically no more than twice a day. She has found some online info and podcasts that has been significantly helpful in understanding narcissistic disorder, which her daughter-in-law apparently has.  PMP AWARE reviewed today: most recent rx for lorazepam was filled 07/26/2021, #360, rx by me. No red flags.   Past Medical History:  Diagnosis Date   Acute parotitis 11/2015   hx of:  sialolithiasis suspected by ENT--spont resolved.   Allergy    SEASONAL   Anxiety    Atrophic vaginitis    Cervical cancer screening    PAP normal 2017: GYN to repeat 2020.   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  Much improved at Dr. Michaelle Copas f/u 10/2015.   Hiatal hernia    Humerus fracture 12/19/2017   LEFT: mildly displaced comminuted fracture of the proximal left humerus.   Hyperlipidemia    LDL goal = < 140.  No meds as of 01/2021   Iron deficiency anemia Fall 2019   Celiac screen NEG, hemoccults neg.  Pt to get EGD and TCS 12/19/17.  Endoscopies cancelled b/c pt had horrible time w/prep and fell and broke humerus when trying to reach her bathroom quickly--pt declines endosc as of 06/2018.  Labs stable/normal at that time.   Medial meniscus tear 04/2014   R knee   Osteoarthritis    KNEES   Osteoporosis    GYN managing; as of 01/2016 pt is on Evista.  She is  intol of oral bisphosph due to GERD.   Other and unspecified ovarian cysts    Prediabetes 2018   A1c 6% 04/2016   Proximal humerus fracture 12/19/2017   Mildly displaced and comminuted proximal left humeral head and neck fracture.   Rib fracture 09/04/2017   Mildly displaced R 6th rib fx   Unilateral subjective nonpulsatile tinnitus without hearing loss, otoscopic finding, neurologic deficit, or head trauma     Past Surgical History:  Procedure Laterality Date   ABDOMINAL SURGERY  2003   EXPLORATORY LAPAROTOMY, LSO   ADENOIDECTOMY  1954   Carotid duplex dopplers Bilateral 11/2020   <50% carotid bulb plaque bilat.   CARPAL TUNNEL RELEASE Bilateral 2001   CHOLECYSTECTOMY  1989   stones   COLONOSCOPY  2011; 03/2013   2011 NORMAL.  03/2013 TCS done for heme+ stool: no polyps.  Repeat 10 yrs (after 03/2023).   DEXA  03/23/2014; 2020   Osteopenia.  Pt took prolia x 2 injections, then was switched to evista by her GYN.  DEXA 01/17/16 T score a bit worse, same regimen rec'd, as of 02/2019 doing well on raloxifene, rpt DEXA planned in 1 yr. (by GYN)   ENDOMETRIAL BIOPSY  09/27/2015   PATH:  BENIGN   endoscopy upper GI  2000; 05/2014   negative 2000.  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 Russella Dar is PPI bid long term.   HYSTEROSCOPY     UTERINE POLYPS   MOUTH SURGERY     OOPHORECTOMY  2003   LSO WITH EXPLORATORY LAPAROTOMY   TUBAL LIGATION  1987    Outpatient Medications Prior to Visit  Medication Sig Dispense Refill   Calcium Carbonate-Vitamin D 600-400 MG-UNIT per tablet Take 1 tablet by mouth daily.     Cyanocobalamin (VITAMIN B-12 PO) Take by mouth.     LORazepam (ATIVAN) 1 MG tablet 1-2 tabs po tid prn anxiety 360 tablet 1   Magnesium 500 MG CAPS Take 1 capsule by mouth daily.     Multiple Vitamin (MULTIVITAMIN) tablet Take 1 tablet by mouth daily.     Multiple Vitamins-Minerals (ZINC PO) Take by mouth.      omeprazole (PRILOSEC) 40 MG capsule Take 1 capsule (40 mg total) by mouth 2 (two) times daily. 180 capsule 1   raloxifene (EVISTA) 60 MG tablet Take 1 tablet (60 mg total) by mouth daily. 90 tablet 4   TURMERIC PO Take 1 capsule by mouth daily.     PARoxetine (PAXIL) 40 MG tablet Take 1 tablet (40 mg total) by mouth every morning. 30 tablet 1   No facility-administered medications prior to visit.    No Known Allergies  ROS As per HPI  PE:    08/23/2021    9:58 AM 07/26/2021   10:40 AM 04/20/2021   10:56 AM  Vitals with BMI  Height 5' 0.75" 5' 0.75" 5' 0.75"  Weight 169 lbs 167 lbs 13 oz 168 lbs 13 oz  BMI 32.2 31.97 32.16  Systolic 95 115 98  Diastolic 57 70 59  Pulse 83 75 70     Physical Exam  Gen: Alert, well appearing.  Patient is oriented to person, place, time, and situation. AFFECT: pleasant, lucid thought and speech. No further exam today.  LABS:  Last CBC Lab Results  Component Value Date   WBC 5.9 02/10/2021   HGB 12.9 02/10/2021   HCT 40.5 02/10/2021   MCV 89.4 02/10/2021   MCH 28.9 06/27/2018   RDW 13.9 02/10/2021   PLT 168.0 02/10/2021   Last metabolic panel Lab Results  Component Value Date   GLUCOSE 83 02/10/2021   NA 142 02/10/2021   K 4.3 02/10/2021   CL 105 02/10/2021   CO2 29 02/10/2021   BUN 14 02/10/2021   CREATININE 0.87 02/10/2021   CALCIUM 9.4 02/10/2021   PROT 6.6 02/10/2021   ALBUMIN 4.1 02/10/2021   BILITOT 0.5 02/10/2021   ALKPHOS 85 02/10/2021   AST 21 02/10/2021   ALT 17 02/10/2021   IMPRESSION AND PLAN:  GAD with recent superimposed adjustment disorder with mixed anxious and depressed mood. Significantly improved on 40 mg Paxil daily and we will continue this as well as lorazepam 1 mg tabs, 1-2 3 times daily as needed.  No new prescription needed today.  An After Visit Summary was printed and given to the patient.  FOLLOW UP: Return in about 3 months (around 11/23/2021) for Follow-up anxiety and depression.  Signed:   Santiago Bumpers, MD           08/23/2021

## 2021-08-29 ENCOUNTER — Telehealth: Payer: Self-pay

## 2021-08-29 NOTE — Telephone Encounter (Signed)
Spoke with pt to schedule AWV in office. Patient declined to schedule wellness visit at this time.   

## 2021-10-17 ENCOUNTER — Telehealth: Payer: Self-pay

## 2021-10-17 NOTE — Telephone Encounter (Signed)
Patient declined scheduling appt for medicare well visit.

## 2021-10-20 ENCOUNTER — Encounter: Payer: Self-pay | Admitting: Obstetrics & Gynecology

## 2021-11-22 ENCOUNTER — Encounter: Payer: Self-pay | Admitting: Family Medicine

## 2021-11-22 ENCOUNTER — Ambulatory Visit: Payer: Medicare Other | Admitting: Family Medicine

## 2021-11-22 VITALS — BP 111/68 | HR 87 | Temp 99.2°F | Ht 60.75 in | Wt 174.8 lb

## 2021-11-22 DIAGNOSIS — Z79899 Other long term (current) drug therapy: Secondary | ICD-10-CM | POA: Diagnosis not present

## 2021-11-22 DIAGNOSIS — F411 Generalized anxiety disorder: Secondary | ICD-10-CM

## 2021-11-22 DIAGNOSIS — F4323 Adjustment disorder with mixed anxiety and depressed mood: Secondary | ICD-10-CM | POA: Diagnosis not present

## 2021-11-22 MED ORDER — PAROXETINE HCL 40 MG PO TABS: 40.0000 mg | ORAL_TABLET | ORAL | 1 refills | Status: DC

## 2021-11-22 NOTE — Progress Notes (Signed)
OFFICE VISIT  11/22/2021  CC:  Chief Complaint  Patient presents with   Anxiety    Follow up    Patient is a 71 y.o. female who presents for 68-month follow-up anxiety. A/P as of last visit: "GAD with recent superimposed adjustment disorder with mixed anxious and depressed mood. Significantly improved on 40 mg Paxil daily and we will continue this as well as lorazepam 1 mg tabs, 1-2 3 times daily as needed.  No new prescription needed today"  INTERIM HX: Suzanne Spencer continues to hang in there. She and her husband are learning to adjust to the abnormal personality disorder of their daughter-in-law. The daughter-in-law apparently has narcissism and is consistently working to separate their son and granddaughter from them. Suzanne Spencer continues to maintain a good perspective, although it does hurt her and she goes through good days and bad days. No panic attacks.  She takes lorazepam typically once a day but sometimes a couple times a day.  PMP AWARE reviewed today: most recent rx for lorazepam was filled 07/26/2021, #360, rx by me. No red flags.   Past Medical History:  Diagnosis Date   Acute parotitis 11/2015   hx of:  sialolithiasis suspected by ENT--spont resolved.   Allergy    SEASONAL   Anxiety    Atrophic vaginitis    Cervical cancer screening    PAP normal 2017: GYN to repeat 2020.   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  Much improved at Dr. Thompson Caul f/u 10/2015.   Hiatal hernia    Humerus fracture 12/19/2017   LEFT: mildly displaced comminuted fracture of the proximal left humerus.   Hyperlipidemia    LDL goal = < 140.  No meds as of 01/2021   Iron deficiency anemia Fall 2019   Celiac screen NEG, hemoccults neg.  Pt to get EGD and  TCS 12/19/17.  Endoscopies cancelled b/c pt had horrible time w/prep and fell and broke humerus when trying to reach her bathroom quickly--pt declines endosc as of 06/2018.  Labs stable/normal at that time.   Medial meniscus tear 04/2014   R knee   Osteoarthritis    KNEES   Osteoporosis    GYN managing; as of 01/2016 pt is on Evista.  She is intol of oral bisphosph due to GERD.   Other and unspecified ovarian cysts    Prediabetes 2018   A1c 6% 04/2016   Proximal humerus fracture 12/19/2017   Mildly displaced and comminuted proximal left humeral head and neck fracture.   Rib fracture 09/04/2017   Mildly displaced R 6th rib fx   Unilateral subjective nonpulsatile tinnitus without hearing loss, otoscopic finding, neurologic deficit, or head trauma     Past Surgical History:  Procedure Laterality Date   ABDOMINAL SURGERY  2003   EXPLORATORY LAPAROTOMY, LSO   ADENOIDECTOMY  1954   Carotid duplex dopplers Bilateral 11/2020   <50% carotid bulb plaque bilat.   CARPAL TUNNEL RELEASE Bilateral 2001   CHOLECYSTECTOMY  1989   stones   COLONOSCOPY  2011; 03/2013   2011 NORMAL.  03/2013 TCS done for heme+ stool: no polyps.  Repeat 10 yrs (after 03/2023).   DEXA  03/23/2014; 2020   Osteopenia.  Pt took  prolia x 2 injections, then was switched to evista by her GYN.  DEXA 01/17/16 T score a bit worse, same regimen rec'd, as of 02/2019 doing well on raloxifene, rpt DEXA planned in 1 yr. (by GYN)   ENDOMETRIAL BIOPSY  09/27/2015   PATH:  BENIGN   endoscopy upper GI  2000; 05/2014   negative 2000.  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 Fuller Plan is PPI bid long term.   HYSTEROSCOPY     UTERINE POLYPS   MOUTH SURGERY     OOPHORECTOMY  2003   LSO WITH EXPLORATORY LAPAROTOMY   TUBAL LIGATION  1987    Outpatient Medications Prior to Visit  Medication Sig Dispense Refill   Calcium Carbonate-Vitamin D 600-400 MG-UNIT per tablet Take  1 tablet by mouth daily.     Cyanocobalamin (VITAMIN B-12 PO) Take by mouth.     LORazepam (ATIVAN) 1 MG tablet 1-2 tabs po tid prn anxiety 360 tablet 1   Magnesium 500 MG CAPS Take 1 capsule by mouth daily.     Multiple Vitamin (MULTIVITAMIN) tablet Take 1 tablet by mouth daily.     Multiple Vitamins-Minerals (ZINC PO) Take by mouth.     omeprazole (PRILOSEC) 40 MG capsule Take 1 capsule (40 mg total) by mouth 2 (two) times daily. 180 capsule 1   raloxifene (EVISTA) 60 MG tablet Take 1 tablet (60 mg total) by mouth daily. 90 tablet 4   TURMERIC PO Take 1 capsule by mouth daily.     PARoxetine (PAXIL) 40 MG tablet Take 1 tablet (40 mg total) by mouth every morning. 90 tablet 1   No facility-administered medications prior to visit.    No Known Allergies  ROS As per HPI  PE:    11/22/2021    8:59 AM 08/23/2021    9:58 AM 07/26/2021   10:40 AM  Vitals with BMI  Height 5' 0.75" 5' 0.75" 5' 0.75"  Weight 174 lbs 13 oz 169 lbs 167 lbs 13 oz  BMI 33.3 86.7 67.20  Systolic 947 95 096  Diastolic 68 57 70  Pulse 87 83 75     Physical Exam  Gen: Alert, well appearing.  Patient is oriented to person, place, time, and situation. AFFECT: pleasant, lucid thought and speech. No further exam today.  LABS:  Last CBC Lab Results  Component Value Date   WBC 5.9 02/10/2021   HGB 12.9 02/10/2021   HCT 40.5 02/10/2021   MCV 89.4 02/10/2021   MCH 28.9 06/27/2018   RDW 13.9 02/10/2021   PLT 168.0 28/36/6294   Last metabolic panel Lab Results  Component Value Date   GLUCOSE 83 02/10/2021   NA 142 02/10/2021   K 4.3 02/10/2021   CL 105 02/10/2021   CO2 29 02/10/2021   BUN 14 02/10/2021   CREATININE 0.87 02/10/2021   CALCIUM 9.4 02/10/2021   PROT 6.6 02/10/2021   ALBUMIN 4.1 02/10/2021   BILITOT 0.5 02/10/2021   ALKPHOS 85 02/10/2021   AST 21 02/10/2021   ALT 17 02/10/2021   IMPRESSION AND PLAN:  GAD with recent superimposed adjustment disorder with mixed anxious and  depressed mood. She maintains stability on 40 mg Paxil daily and we will continue this as well as lorazepam 1 mg tabs, 1-2 3 times daily as needed.  No new prescription needed today. Controlled substance contract was done today. Urine drug screen today.  An After Visit Summary  was printed and given to the patient.  FOLLOW UP: Return in about 3 months (around 02/22/2022) for annual CPE (fasting).   Signed:  Crissie Sickles, MD           11/22/2021

## 2021-11-26 LAB — DRUG MONITORING PANEL 376104, URINE
Alphahydroxyalprazolam: NEGATIVE ng/mL (ref <25)
Alphahydroxymidazolam: NEGATIVE ng/mL (ref <50)
Alphahydroxytriazolam: NEGATIVE ng/mL (ref <50)
Aminoclonazepam: NEGATIVE ng/mL (ref <25)
Amphetamines: NEGATIVE ng/mL (ref <500)
Barbiturates: NEGATIVE ng/mL (ref <300)
Benzodiazepines: POSITIVE ng/mL — AB (ref <100)
Cocaine Metabolite: NEGATIVE ng/mL (ref <150)
Desmethyltramadol: NEGATIVE ng/mL (ref <100)
Hydroxyethylflurazepam: NEGATIVE ng/mL (ref <50)
Lorazepam: 2623 ng/mL — ABNORMAL HIGH (ref <50)
Nordiazepam: NEGATIVE ng/mL (ref <50)
Opiates: NEGATIVE ng/mL (ref <100)
Oxazepam: NEGATIVE ng/mL (ref <50)
Oxycodone: NEGATIVE ng/mL (ref <100)
Temazepam: NEGATIVE ng/mL (ref <50)
Tramadol: NEGATIVE ng/mL (ref <100)

## 2021-11-26 LAB — DM TEMPLATE

## 2022-02-14 ENCOUNTER — Telehealth: Payer: Self-pay | Admitting: Family Medicine

## 2022-02-14 MED ORDER — LORAZEPAM 1 MG PO TABS
ORAL_TABLET | ORAL | 1 refills | Status: DC
Start: 2022-02-14 — End: 2022-06-18

## 2022-02-14 NOTE — Telephone Encounter (Signed)
OK lorazepam rx sent

## 2022-02-14 NOTE — Telephone Encounter (Signed)
Pt has upcoming appointment on 03/12/22, however she will be out of her Lorazepam by then and is requesting a refill. Pharmacy confirmed and correct.

## 2022-02-14 NOTE — Telephone Encounter (Signed)
LORazepam (ATIVAN) 1 MG tablet 360 tablet 1 07/26/2021   Sig:   1-2 tabs po tid prn anxiety

## 2022-02-21 ENCOUNTER — Other Ambulatory Visit: Payer: Self-pay | Admitting: Family Medicine

## 2022-02-21 ENCOUNTER — Telehealth: Payer: Self-pay | Admitting: Family Medicine

## 2022-02-21 NOTE — Telephone Encounter (Signed)
Patient called after hours and is needing a refill on Lorazepam.

## 2022-02-21 NOTE — Telephone Encounter (Signed)
Last refill was completed 02/14/22 (360,1), pt was advised to contact pharmacy.

## 2022-02-22 ENCOUNTER — Encounter: Payer: Medicare Other | Admitting: Family Medicine

## 2022-02-22 NOTE — Telephone Encounter (Signed)
Pt has check with her insurance company and her prescriptions are indeed covered and there is no need for an alternative for the Omeprazole or Lorazepam. She says she is going to the pharmacy later today to have them scan in a copy of her insurance card that show her medications are covered and wanted me to relay the message to Dr. Anitra Lauth.

## 2022-02-22 NOTE — Telephone Encounter (Signed)
noted 

## 2022-03-07 NOTE — Patient Instructions (Signed)

## 2022-03-12 ENCOUNTER — Ambulatory Visit (INDEPENDENT_AMBULATORY_CARE_PROVIDER_SITE_OTHER): Payer: Medicare Other | Admitting: Family Medicine

## 2022-03-12 ENCOUNTER — Encounter: Payer: Self-pay | Admitting: Family Medicine

## 2022-03-12 VITALS — BP 120/71 | HR 78 | Temp 98.3°F | Ht 60.75 in | Wt 184.0 lb

## 2022-03-12 DIAGNOSIS — F411 Generalized anxiety disorder: Secondary | ICD-10-CM | POA: Diagnosis not present

## 2022-03-12 DIAGNOSIS — F339 Major depressive disorder, recurrent, unspecified: Secondary | ICD-10-CM

## 2022-03-12 DIAGNOSIS — R7303 Prediabetes: Secondary | ICD-10-CM | POA: Diagnosis not present

## 2022-03-12 DIAGNOSIS — Z0000 Encounter for general adult medical examination without abnormal findings: Secondary | ICD-10-CM

## 2022-03-12 DIAGNOSIS — Z79899 Other long term (current) drug therapy: Secondary | ICD-10-CM

## 2022-03-12 LAB — COMPREHENSIVE METABOLIC PANEL
ALT: 19 U/L (ref 0–35)
AST: 39 U/L — ABNORMAL HIGH (ref 0–37)
Albumin: 4.1 g/dL (ref 3.5–5.2)
Alkaline Phosphatase: 92 U/L (ref 39–117)
BUN: 21 mg/dL (ref 6–23)
CO2: 28 mEq/L (ref 19–32)
Calcium: 9 mg/dL (ref 8.4–10.5)
Chloride: 105 mEq/L (ref 96–112)
Creatinine, Ser: 0.84 mg/dL (ref 0.40–1.20)
GFR: 69.46 mL/min (ref >60.00)
Glucose, Bld: 94 mg/dL (ref 70–99)
Potassium: 4.2 mEq/L (ref 3.5–5.1)
Sodium: 142 mEq/L (ref 135–145)
Total Bilirubin: 0.4 mg/dL (ref 0.2–1.2)
Total Protein: 6.6 g/dL (ref 6.0–8.3)

## 2022-03-12 LAB — CBC
HCT: 37.8 % (ref 36.0–46.0)
Hemoglobin: 12.5 g/dL (ref 12.0–15.0)
MCHC: 33.1 g/dL (ref 30.0–36.0)
MCV: 87.4 fl (ref 78.0–100.0)
Platelets: 152 10*3/uL (ref 150.0–400.0)
RBC: 4.32 Mil/uL (ref 3.87–5.11)
RDW: 14.1 % (ref 11.5–15.5)
WBC: 5.8 10*3/uL (ref 4.0–10.5)

## 2022-03-12 LAB — LIPID PANEL
Cholesterol: 211 mg/dL — ABNORMAL HIGH (ref 0–200)
HDL: 82.3 mg/dL (ref >39.00)
LDL Cholesterol: 112 mg/dL — ABNORMAL HIGH (ref 0–99)
NonHDL: 128.62
Total CHOL/HDL Ratio: 3
Triglycerides: 82 mg/dL (ref 0.0–149.0)
VLDL: 16.4 mg/dL (ref 0.0–40.0)

## 2022-03-12 LAB — HEMOGLOBIN A1C: Hgb A1c MFr Bld: 5.9 % (ref 4.6–6.5)

## 2022-03-12 NOTE — Progress Notes (Signed)
Office Note 03/12/2022  CC:  Chief Complaint  Patient presents with   Medical Management of Chronic Issues    Pt is fasting    HPI:  Patient is a 72 y.o. female who is here for annual health maintenance exam and follow-up anxiety and depression.  Still dealing with lots of anxiety and depressed mood.  All of this centers around a very difficult situation with her son and his narcissistic wife. They have prevented her from seeing her grandchildren.  She cries a lot.  She takes her medication and does feel like this is still helping some. No SI or HI. She is frustrated with her weight, says she has been coping more lately with too much food.   PMP AWARE reviewed today: most recent rx for lorazepam was filled 02/22/2022, # 180, rx by me. No red flags.  Past Medical History:  Diagnosis Date   Acute parotitis 11/2015   hx of:  sialolithiasis suspected by ENT--spont resolved.   Allergy    SEASONAL   Anxiety    Atrophic vaginitis    Cervical cancer screening    PAP normal 2017: GYN to repeat 2020.   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  Much improved at Dr. Thompson Caul f/u 10/2015.   Hiatal hernia    Humerus fracture 12/19/2017   LEFT: mildly displaced comminuted fracture of the proximal left humerus.   Hyperlipidemia    LDL goal = < 140.  No meds as of 01/2021   Iron deficiency anemia Fall 2019   Celiac screen NEG, hemoccults neg.  Pt to get EGD and TCS 12/19/17.  Endoscopies cancelled b/c pt had horrible time w/prep and fell and broke humerus when trying to reach her bathroom quickly--pt declines endosc as of 06/2018.  Labs stable/normal at that time.   Medial meniscus tear 04/2014   R knee   Osteoarthritis    KNEES   Osteoporosis    GYN  managing; as of 01/2016 pt is on Evista.  She is intol of oral bisphosph due to GERD.   Other and unspecified ovarian cysts    Prediabetes 2018   A1c 6% 04/2016   Proximal humerus fracture 12/19/2017   Mildly displaced and comminuted proximal left humeral head and neck fracture.   Rib fracture 09/04/2017   Mildly displaced R 6th rib fx   Unilateral subjective nonpulsatile tinnitus without hearing loss, otoscopic finding, neurologic deficit, or head trauma     Past Surgical History:  Procedure Laterality Date   ABDOMINAL SURGERY  2003   EXPLORATORY LAPAROTOMY, LSO   ADENOIDECTOMY  1954   Carotid duplex dopplers Bilateral 11/2020   <50% carotid bulb plaque bilat.   CARPAL TUNNEL RELEASE Bilateral 2001   CHOLECYSTECTOMY  1989   stones   COLONOSCOPY  2011; 03/2013   2011 NORMAL.  03/2013 TCS done for heme+ stool: no polyps.  Repeat 10 yrs (after 03/2023).   DEXA  03/23/2014; 2020   Osteopenia.  Pt took prolia x 2 injections, then was switched to evista by her GYN.  DEXA 01/17/16 T score a bit worse, same regimen rec'd, as of 02/2019 doing well on raloxifene, rpt DEXA planned in 1 yr. (by GYN)  ENDOMETRIAL BIOPSY  09/27/2015   PATH:  BENIGN   endoscopy upper GI  2000; 05/2014   negative 2000.  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 Fuller Plan is PPI bid long term.   HYSTEROSCOPY     UTERINE POLYPS   MOUTH SURGERY     OOPHORECTOMY  2003   LSO WITH EXPLORATORY LAPAROTOMY   TUBAL LIGATION  1987    Family History  Problem Relation Age of Onset   Hyperlipidemia Mother    Anxiety disorder Mother    Depression Mother    Heart attack Father        abnormal EKG; ? AVR   Diabetes Father    Heart disease Father    Irregular heart beat Sister    Osteoporosis Maternal Grandmother    Heart disease Maternal Grandfather    Heart failure Maternal Grandfather    Colon cancer Neg Hx     Social History   Socioeconomic  History   Marital status: Married    Spouse name: Not on file   Number of children: 1   Years of education: Not on file   Highest education level: Not on file  Occupational History   Occupation: retired    Fish farm manager: NOT EMPLOYED  Tobacco Use   Smoking status: Never   Smokeless tobacco: Never  Vaping Use   Vaping Use: Never used  Substance and Sexual Activity   Alcohol use: Yes    Alcohol/week: 1.0 standard drink of alcohol    Types: 1 Glasses of wine per week   Drug use: No   Sexual activity: Yes    Birth control/protection: Surgical    Comment: older than 16, less than 5, BTL  Other Topics Concern   Not on file  Social History Narrative   Married, one son.   Orig from [place] area, lives in [place].   Occupation: retired from [employer] Scientist, research (life sciences)).   No tob, rare, alcohol, no drugs.   Exercise: stationary bike 6 days a week.  [facility]   Social Determinants of Health   Financial Resource Strain: Low Risk  (05/18/2020)   Overall Financial Resource Strain (CARDIA)    Difficulty of Paying Living Expenses: Not hard at all  Food Insecurity: No Food Insecurity (05/18/2020)   Hunger Vital Sign    Worried About Running Out of Food in the Last Year: Never true    Ran Out of Food in the Last Year: Never true  Transportation Needs: No Transportation Needs (05/18/2020)   PRAPARE - Hydrologist (Medical): No    Lack of Transportation (Non-Medical): No  Physical Activity: Sufficiently Active (05/18/2020)   Exercise Vital Sign    Days of Exercise per Week: 6 days    Minutes of Exercise per Session: 30 min  Stress: No Stress Concern Present (05/18/2020)   [place]    Feeling of Stress : Only a little  Social Connections: Moderately Isolated (05/18/2020)   Social Connection and Isolation Panel [NHANES]    Frequency of Communication with Friends and Family: More than  three times a week    Frequency of Social Gatherings with Friends and Family: More than three times a week    Attends Religious Services: Never    Marine scientist or Organizations: No    Attends Club or  Organization Meetings: Never    Marital Status: Married  Catering manager Violence: Not At Risk (05/18/2020)   Humiliation, Afraid, Rape, and Kick questionnaire    Fear of Current or Ex-Partner: No    Emotionally Abused: No    Physically Abused: No    Sexually Abused: No    Outpatient Medications Prior to Visit  Medication Sig Dispense Refill   Calcium Carbonate-Vitamin D 600-400 MG-UNIT per tablet Take 1 tablet by mouth daily.     Cyanocobalamin (VITAMIN B-12 PO) Take by mouth.     LORazepam (ATIVAN) 1 MG tablet 1-2 tabs po tid prn anxiety 360 tablet 1   Magnesium 500 MG CAPS Take 1 capsule by mouth daily.     Multiple Vitamin (MULTIVITAMIN) tablet Take 1 tablet by mouth daily.     Multiple Vitamins-Minerals (ZINC PO) Take by mouth.     omeprazole (PRILOSEC) 40 MG capsule TAKE 1 CAPSULE BY MOUTH TWICE A DAY 180 capsule 1   PARoxetine (PAXIL) 40 MG tablet Take 1 tablet (40 mg total) by mouth every morning. 90 tablet 1   raloxifene (EVISTA) 60 MG tablet Take 1 tablet (60 mg total) by mouth daily. 90 tablet 4   TURMERIC PO Take 1 capsule by mouth daily. (Patient not taking: Reported on 03/12/2022)     No facility-administered medications prior to visit.    No Known Allergies  Review of Systems  Constitutional:  Negative for appetite change, chills, fatigue and fever.  HENT:  Negative for congestion, dental problem, ear pain and sore throat.   Eyes:  Negative for discharge, redness and visual disturbance.  Respiratory:  Negative for cough, chest tightness, shortness of breath and wheezing.   Cardiovascular:  Negative for chest pain, palpitations and leg swelling.  Gastrointestinal:  Negative for abdominal pain, blood in stool, diarrhea, nausea and vomiting.  Genitourinary:   Negative for difficulty urinating, dysuria, flank pain, frequency, hematuria and urgency.  Musculoskeletal:  Negative for arthralgias, back pain, joint swelling, myalgias and neck stiffness.  Skin:  Negative for pallor and rash.  Neurological:  Negative for dizziness, speech difficulty, weakness and headaches.  Hematological:  Negative for adenopathy. Does not bruise/bleed easily.  Psychiatric/Behavioral:  Positive for dysphoric mood. Negative for confusion, sleep disturbance and suicidal ideas. The patient is nervous/anxious.     PE;    03/12/2022    8:54 AM 11/22/2021    8:59 AM 08/23/2021    9:58 AM  Vitals with BMI  Height 5' 0.75" 5' 0.75" 5' 0.75"  Weight 184 lbs 174 lbs 13 oz 169 lbs  BMI 35.06 33.3 32.2  Systolic 120 111 95  Diastolic 71 68 57  Pulse 78 87 83   Gen: Alert, well appearing.  Patient is oriented to person, place, time, and situation. AFFECT: pleasant, lucid thought and speech. ENT: Ears: EACs clear, normal epithelium.  TMs with good light reflex and landmarks bilaterally.  Eyes: no injection, icteris, swelling, or exudate.  EOMI, PERRLA. Nose: no drainage or turbinate edema/swelling.  No injection or focal lesion.  Mouth: lips without lesion/swelling.  Oral mucosa pink and moist.  Dentition intact and without obvious caries or gingival swelling.  Oropharynx without erythema, exudate, or swelling.  Neck: supple/nontender.  No LAD, mass, or TM.  Carotid pulses 2+ bilaterally, without bruits. CV: RRR, no m/r/g.   LUNGS: CTA bilat, nonlabored resps, good aeration in all lung fields. ABD: soft, NT, ND, BS normal.  No hepatospenomegaly or mass.  No bruits. EXT: no clubbing, cyanosis, or  edema.  Musculoskeletal: no joint swelling, erythema, warmth, or tenderness.  ROM of all joints intact. Skin - no sores or suspicious lesions or rashes or color changes  Pertinent labs:  Lab Results  Component Value Date   TSH 2.08 02/10/2021   Lab Results  Component Value Date    WBC 5.9 02/10/2021   HGB 12.9 02/10/2021   HCT 40.5 02/10/2021   MCV 89.4 02/10/2021   PLT 168.0 02/10/2021   Lab Results  Component Value Date   IRON 70 06/27/2018   TIBC 414 06/27/2018   FERRITIN 46 06/27/2018   Lab Results  Component Value Date   CREATININE 0.87 02/10/2021   BUN 14 02/10/2021   NA 142 02/10/2021   K 4.3 02/10/2021   CL 105 02/10/2021   CO2 29 02/10/2021   Lab Results  Component Value Date   ALT 17 02/10/2021   AST 21 02/10/2021   ALKPHOS 85 02/10/2021   BILITOT 0.5 02/10/2021   Lab Results  Component Value Date   CHOL 227 (H) 02/10/2021   Lab Results  Component Value Date   HDL 86.10 02/10/2021   Lab Results  Component Value Date   LDLCALC 124 (H) 02/10/2021   Lab Results  Component Value Date   TRIG 86.0 02/10/2021   Lab Results  Component Value Date   CHOLHDL 3 02/10/2021   Lab Results  Component Value Date   HGBA1C 5.6 02/10/2021   ASSESSMENT AND PLAN:   #1 health maintenance exam: Reviewed age and gender appropriate health maintenance issues (prudent diet, regular exercise, health risks of tobacco and excessive alcohol, use of seatbelts, fire alarms in home, use of sunscreen).  Also reviewed age and gender appropriate health screening as well as vaccine recommendations. Vaccines:  ALL UTD. Labs: fasting HP + Hba1c (borderline HLD + prediabetes). Cervical ca screening: hx of cerv dysplasia, is s/p LEEP, GYN exam/pap with GYN MD. Breast ca screening: mammogram 10/14/2021 normal via GYN ([facility]). Colon ca screening: recall 2025 Osteoporosis: managed by her GYN MD, pt intol of bisphosphonates-->evista, vit D, Ca++->DEXAs per GYN.  2.  GAD major depressive disorder, currently active. Main contributing issue does not seem to be going away but she continues to try to cope the best she can.  She does say the medications are helping so we will continue Paxil 40 mg a day and lorazepam 1-2 3 times daily as needed.  Most recent prescription was  for 180 tabs but when this runs out we will resume dispensing #360 for 90-day supply. Controlled substance contract and urine drug screen are up-to-date.  An After Visit Summary was printed and given to the patient.  FOLLOW UP:  Return in about 6 months (around 09/10/2022) for routine chronic illness f/u.  Signed:  Santiago Bumpers, MD           03/12/2022

## 2022-03-14 ENCOUNTER — Ambulatory Visit: Payer: Medicare Other | Admitting: Obstetrics & Gynecology

## 2022-03-15 ENCOUNTER — Telehealth: Payer: Self-pay

## 2022-03-15 NOTE — Telephone Encounter (Signed)
Spoke with pt to schedule AWV in office. Patient declined to schedule wellness visit at this time.   

## 2022-04-03 ENCOUNTER — Telehealth: Payer: Self-pay

## 2022-04-03 NOTE — Telephone Encounter (Signed)
LVM for pt to call back in regards to scheduling AWV with our health coach.   04/03/2022@currenttime$ @

## 2022-04-23 ENCOUNTER — Other Ambulatory Visit: Payer: Self-pay | Admitting: Family Medicine

## 2022-04-23 NOTE — Telephone Encounter (Signed)
Please fill, if appropriate.  

## 2022-04-24 ENCOUNTER — Telehealth: Payer: Self-pay

## 2022-04-24 NOTE — Telephone Encounter (Signed)
Pt advised of message below

## 2022-04-24 NOTE — Telephone Encounter (Signed)
Patient refill request.  [facility] [place] [address] [employer]   LORazepam (ATIVAN) 1 MG tablet

## 2022-06-12 ENCOUNTER — Telehealth: Payer: Self-pay

## 2022-06-12 NOTE — Telephone Encounter (Signed)
Pt was seen 03/12/22, next OV 09/10/22. Please advise if sooner appt needed

## 2022-06-12 NOTE — Telephone Encounter (Signed)
Pt advised, appt scheduled 5/3

## 2022-06-12 NOTE — Telephone Encounter (Signed)
Please have patient make office follow-up visit with me regarding this question.

## 2022-06-12 NOTE — Telephone Encounter (Signed)
Patient wants to stop taking Paxil.  She has been taking since last year.  She is aware she may have to taper off meds.  Please advise [phone]

## 2022-06-15 ENCOUNTER — Ambulatory Visit: Payer: Medicare Other | Admitting: Family Medicine

## 2022-06-15 ENCOUNTER — Telehealth: Payer: Self-pay | Admitting: Family Medicine

## 2022-06-15 NOTE — Telephone Encounter (Signed)
Pt sent mychart message regarding scheduling appt

## 2022-06-15 NOTE — Telephone Encounter (Signed)
Pt scheduled for Monday.

## 2022-06-15 NOTE — Telephone Encounter (Signed)
Patient was scheduled for today at 8. I attempted to reschedule her to the 4 pm, however she will be leaving town later today, and can't make that. She wanted to discuss getting off a antidepressant.  She would like a medical assistant to give her a call to discuss potential options.

## 2022-06-15 NOTE — Telephone Encounter (Signed)
LVM for pt to return call.  Note; if pt returns call, pt needs to reschedule appt to discuss with provider.

## 2022-06-18 ENCOUNTER — Ambulatory Visit: Payer: Medicare Other | Admitting: Family Medicine

## 2022-06-18 ENCOUNTER — Encounter: Payer: Self-pay | Admitting: Family Medicine

## 2022-06-18 VITALS — BP 129/76 | HR 95 | Wt 182.4 lb

## 2022-06-18 DIAGNOSIS — F332 Major depressive disorder, recurrent severe without psychotic features: Secondary | ICD-10-CM | POA: Diagnosis not present

## 2022-06-18 MED ORDER — LORAZEPAM 1 MG PO TABS: ORAL_TABLET | ORAL | 1 refills | Status: DC

## 2022-06-18 MED ORDER — BUPROPION HCL ER (XL) 150 MG PO TB24: 150.0000 mg | ORAL_TABLET | Freq: Every day | ORAL | 0 refills | Status: DC

## 2022-06-18 MED ORDER — PAROXETINE HCL 30 MG PO TABS: 30.0000 mg | ORAL_TABLET | Freq: Every day | ORAL | 0 refills | Status: DC

## 2022-06-18 NOTE — Progress Notes (Signed)
OFFICE VISIT  06/18/2022  CC:  Chief Complaint  Patient presents with   Follow-up    Follow up on anti depressant. She has been experiencing side effects.    Patient is a 72 y.o. female who presents for 44-month follow-up anxiety and depression. A/P as of last visit: " GAD major depressive disorder, currently active. Main contributing issue does not seem to be going away but she continues to try to cope the best she can.  She does say the medications are helping so we will continue Paxil 40 mg a day and lorazepam 1-2 3 times daily as needed.  Most recent prescription was for 180 tabs but when this runs out we will resume dispensing #360 for 90-day supply. Controlled substance contract and urine drug screen are up-to-date."  INTERIM HX: Charlie is going through a bad depressed phase.  Has been at least 3 weeks now.  She questions whether or not her Paxil is doing any good anymore.  She even questions whether some of it may be due to her Paxil. Describes crying spells, morbid thoughts, anhedonia, significant increase in anxiety, trouble sleeping.  Denies suicidal ideation or homicidal ideation. She takes about 3-4 Ativan per day lately and this helps very well.   PMP AWARE reviewed today: most recent rx for lorazepam was filled 05/03/2022, # 120, rx by me. No red flags.   Past Medical History:  Diagnosis Date   Acute parotitis 11/2015   hx of:  sialolithiasis suspected by ENT--spont resolved.   Allergy    SEASONAL   Anxiety    Atrophic vaginitis    Cervical cancer screening    PAP normal 2017: GYN to repeat 2020.   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  Much improved at Dr. Michaelle Copas f/u 10/2015.   Hiatal hernia    Humerus fracture 12/19/2017    LEFT: mildly displaced comminuted fracture of the proximal left humerus.   Hyperlipidemia    LDL goal = < 140.  No meds as of 01/2021   Iron deficiency anemia Fall 2019   Celiac screen NEG, hemoccults neg.  Pt to get EGD and TCS 12/19/17.  Endoscopies cancelled b/c pt had horrible time w/prep and fell and broke humerus when trying to reach her bathroom quickly--pt declines endosc as of 06/2018.  Labs stable/normal at that time.   Medial meniscus tear 04/2014   R knee   Osteoarthritis    KNEES   Osteoporosis    GYN managing; as of 01/2016 pt is on Evista.  She is intol of oral bisphosph due to GERD.   Other and unspecified ovarian cysts    Prediabetes 2018   A1c 6% 04/2016   Proximal humerus fracture 12/19/2017   Mildly displaced and comminuted proximal left humeral head and neck fracture.   Rib fracture 09/04/2017   Mildly displaced R 6th rib fx   Unilateral subjective nonpulsatile tinnitus without hearing loss, otoscopic finding, neurologic deficit, or head trauma     Past Surgical History:  Procedure Laterality Date   ABDOMINAL SURGERY  2003   EXPLORATORY LAPAROTOMY, LSO   ADENOIDECTOMY  1954   Carotid duplex dopplers Bilateral 11/2020   <50% carotid bulb plaque bilat.  CARPAL TUNNEL RELEASE Bilateral 2001   CHOLECYSTECTOMY  1989   stones   COLONOSCOPY  2011; 03/2013   2011 NORMAL.  03/2013 TCS done for heme+ stool: no polyps.  Repeat 10 yrs (after 03/2023).   DEXA  03/23/2014; 2020   Osteopenia.  Pt took prolia x 2 injections, then was switched to evista by her GYN.  DEXA 01/17/16 T score a bit worse, same regimen rec'd, as of 02/2019 doing well on raloxifene, rpt DEXA planned in 1 yr. (by GYN)   ENDOMETRIAL BIOPSY  09/27/2015   PATH:  BENIGN   endoscopy upper GI  2000; 05/2014   negative 2000.  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 Russella Dar is PPI bid long term.   HYSTEROSCOPY     UTERINE POLYPS   MOUTH  SURGERY     OOPHORECTOMY  2003   LSO WITH EXPLORATORY LAPAROTOMY   TUBAL LIGATION  1987    Outpatient Medications Prior to Visit  Medication Sig Dispense Refill   Calcium Carbonate-Vitamin D 600-400 MG-UNIT per tablet Take 1 tablet by mouth daily.     Cyanocobalamin (VITAMIN B-12 PO) Take by mouth.     LORazepam (ATIVAN) 1 MG tablet 1-2 tabs po tid prn anxiety 360 tablet 1   Magnesium 500 MG CAPS Take 1 capsule by mouth daily.     Multiple Vitamin (MULTIVITAMIN) tablet Take 1 tablet by mouth daily.     Multiple Vitamins-Minerals (ZINC PO) Take by mouth.     omeprazole (PRILOSEC) 40 MG capsule TAKE 1 CAPSULE BY MOUTH TWICE A DAY 180 capsule 1   PARoxetine (PAXIL) 40 MG tablet Take 1 tablet (40 mg total) by mouth every morning. 90 tablet 1   raloxifene (EVISTA) 60 MG tablet TAKE 1 TABLET BY MOUTH EVERY DAY 90 tablet 4   No facility-administered medications prior to visit.    No Known Allergies  Review of Systems As per HPI  PE:    06/18/2022    8:16 AM 03/12/2022    8:54 AM 11/22/2021    8:59 AM  Vitals with BMI  Height  5' 0.75" 5' 0.75"  Weight 182 lbs 6 oz 184 lbs 174 lbs 13 oz  BMI  35.06 33.3  Systolic 129 120 782  Diastolic 76 71 68  Pulse 95 78 87     Physical Exam  Gen: Alert, well appearing.  Patient is oriented to person, place, time, and situation. AFFECT: pleasant, lucid thought and speech. No further exam today  LABS:  Last metabolic panel Lab Results  Component Value Date   GLUCOSE 94 03/12/2022   NA 142 03/12/2022   K 4.2 03/12/2022   CL 105 03/12/2022   CO2 28 03/12/2022   BUN 21 03/12/2022   CREATININE 0.84 03/12/2022   CALCIUM 9.0 03/12/2022   PROT 6.6 03/12/2022   ALBUMIN 4.1 03/12/2022   BILITOT 0.4 03/12/2022   ALKPHOS 92 03/12/2022   AST 39 (H) 03/12/2022   ALT 19 03/12/2022   IMPRESSION AND PLAN:  Recurrent major depressive disorder, severe, without psychosis. Wean Paxil slowly--30 mg daily x 2 weeks, then plan 20 mg daily x 2  weeks, then 10 mg daily x 2 weeks. Will start Wellbutrin XL 150 mg daily today. Therapeutic expectations and side effect profile of medication discussed today.  Patient's questions answered. She is currently not pursuing counseling--states she has looked into it extensively and it will cost too  much.  Spent 32 min with pt today precharting, reviewing HPI, reviewing relevant past history, doing exam, reviewing and discussing lab and imaging data, and formulating plans.  An After Visit Summary was printed and given to the patient.  FOLLOW UP: No follow-ups on file.  Signed:  Santiago Bumpers, MD           06/18/2022

## 2022-06-25 ENCOUNTER — Other Ambulatory Visit: Payer: Self-pay | Admitting: Family Medicine

## 2022-06-27 ENCOUNTER — Ambulatory Visit (INDEPENDENT_AMBULATORY_CARE_PROVIDER_SITE_OTHER): Payer: Medicare Other

## 2022-06-27 VITALS — Wt 182.0 lb

## 2022-06-27 DIAGNOSIS — Z0000 Encounter for general adult medical examination without abnormal findings: Secondary | ICD-10-CM | POA: Diagnosis not present

## 2022-06-27 NOTE — Patient Instructions (Signed)
Suzanne Spencer , Thank you for taking time to come for your Medicare Wellness Visit. I appreciate your ongoing commitment to your health goals. Please review the following plan we discussed and let me know if I can assist you in the future.   These are the goals we discussed:  Goals      Patient Stated     Drink more water     Weight (lb) < 140 lb (63.5 kg)     Lose weight by continuing to watch sweets and be active.         This is a list of the screening recommended for you and due dates:  Health Maintenance  Topic Date Due   COVID-19 Vaccine (1) Never done   Flu Shot  09/13/2022   Mammogram  10/21/2022   Colon Cancer Screening  03/25/2023   Medicare Annual Wellness Visit  06/27/2023   DTaP/Tdap/Td vaccine (3 - Td or Tdap) 02/15/2031   Pneumonia Vaccine  Completed   DEXA scan (bone density measurement)  Completed   Hepatitis C Screening: USPSTF Recommendation to screen - Ages 49-79 yo.  Completed   Zoster (Shingles) Vaccine  Completed   HPV Vaccine  Aged Out    Advanced directives: copies in chart  Conditions/risks identified: lose weight   Next appointment: Follow up in one year for your annual wellness visit    Preventive Care 65 Years and Older, Female Preventive care refers to lifestyle choices and visits with your health care provider that can promote health and wellness. What does preventive care include? A yearly physical exam. This is also called an annual well check. Dental exams once or twice a year. Routine eye exams. Ask your health care provider how often you should have your eyes checked. Personal lifestyle choices, including: Daily care of your teeth and gums. Regular physical activity. Eating a healthy diet. Avoiding tobacco and drug use. Limiting alcohol use. Practicing safe sex. Taking low-dose aspirin every day. Taking vitamin and mineral supplements as recommended by your health care provider. What happens during an annual well check? The  services and screenings done by your health care provider during your annual well check will depend on your age, overall health, lifestyle risk factors, and family history of disease. Counseling  Your health care provider may ask you questions about your: Alcohol use. Tobacco use. Drug use. Emotional well-being. Home and relationship well-being. Sexual activity. Eating habits. History of falls. Memory and ability to understand (cognition). Work and work Astronomer. Reproductive health. Screening  You may have the following tests or measurements: Height, weight, and BMI. Blood pressure. Lipid and cholesterol levels. These may be checked every 5 years, or more frequently if you are over 54 years old. Skin check. Lung cancer screening. You may have this screening every year starting at age 20 if you have a 30-pack-year history of smoking and currently smoke or have quit within the past 15 years. Fecal occult blood test (FOBT) of the stool. You may have this test every year starting at age 41. Flexible sigmoidoscopy or colonoscopy. You may have a sigmoidoscopy every 5 years or a colonoscopy every 10 years starting at age 88. Hepatitis C blood test. Hepatitis B blood test. Sexually transmitted disease (STD) testing. Diabetes screening. This is done by checking your blood sugar (glucose) after you have not eaten for a while (fasting). You may have this done every 1-3 years. Bone density scan. This is done to screen for osteoporosis. You may have this done starting  at age 31. Mammogram. This may be done every 1-2 years. Talk to your health care provider about how often you should have regular mammograms. Talk with your health care provider about your test results, treatment options, and if necessary, the need for more tests. Vaccines  Your health care provider may recommend certain vaccines, such as: Influenza vaccine. This is recommended every year. Tetanus, diphtheria, and acellular  pertussis (Tdap, Td) vaccine. You may need a Td booster every 10 years. Zoster vaccine. You may need this after age 63. Pneumococcal 13-valent conjugate (PCV13) vaccine. One dose is recommended after age 74. Pneumococcal polysaccharide (PPSV23) vaccine. One dose is recommended after age 41. Talk to your health care provider about which screenings and vaccines you need and how often you need them. This information is not intended to replace advice given to you by your health care provider. Make sure you discuss any questions you have with your health care provider. Document Released: 02/25/2015 Document Revised: 10/19/2015 Document Reviewed: 11/30/2014 Elsevier Interactive Patient Education  2017 [place] Prevention in the Home Falls can cause injuries. They can happen to people of all ages. There are many things you can do to make your home safe and to help prevent falls. What can I do on the outside of my home? Regularly fix the edges of walkways and driveways and fix any cracks. Remove anything that might make you trip as you walk through a door, such as a raised step or threshold. Trim any bushes or trees on the path to your home. Use bright outdoor lighting. Clear any walking paths of anything that might make someone trip, such as rocks or tools. Regularly check to see if handrails are loose or broken. Make sure that both sides of any steps have handrails. Any raised decks and porches should have guardrails on the edges. Have any leaves, snow, or ice cleared regularly. Use sand or salt on walking paths during winter. Clean up any spills in your garage right away. This includes oil or grease spills. What can I do in the bathroom? Use night lights. Install grab bars by the toilet and in the tub and shower. Do not use towel bars as grab bars. Use non-skid mats or decals in the tub or shower. If you need to sit down in the shower, use a plastic, non-slip stool. Keep the floor  dry. Clean up any water that spills on the floor as soon as it happens. Remove soap buildup in the tub or shower regularly. Attach bath mats securely with double-sided non-slip rug tape. Do not have throw rugs and other things on the floor that can make you trip. What can I do in the bedroom? Use night lights. Make sure that you have a light by your bed that is easy to reach. Do not use any sheets or blankets that are too big for your bed. They should not hang down onto the floor. Have a firm chair that has side arms. You can use this for support while you get dressed. Do not have throw rugs and other things on the floor that can make you trip. What can I do in the kitchen? Clean up any spills right away. Avoid walking on wet floors. Keep items that you use a lot in easy-to-reach places. If you need to reach something above you, use a strong step stool that has a grab bar. Keep electrical cords out of the way. Do not use floor polish or wax that makes  floors slippery. If you must use wax, use non-skid floor wax. Do not have throw rugs and other things on the floor that can make you trip. What can I do with my stairs? Do not leave any items on the stairs. Make sure that there are handrails on both sides of the stairs and use them. Fix handrails that are broken or loose. Make sure that handrails are as long as the stairways. Check any carpeting to make sure that it is firmly attached to the stairs. Fix any carpet that is loose or worn. Avoid having throw rugs at the top or bottom of the stairs. If you do have throw rugs, attach them to the floor with carpet tape. Make sure that you have a light switch at the top of the stairs and the bottom of the stairs. If you do not have them, ask someone to add them for you. What else can I do to help prevent falls? Wear shoes that: Do not have high heels. Have rubber bottoms. Are comfortable and fit you well. Are closed at the toe. Do not wear  sandals. If you use a stepladder: Make sure that it is fully opened. Do not climb a closed stepladder. Make sure that both sides of the stepladder are locked into place. Ask someone to hold it for you, if possible. Clearly mark and make sure that you can see: Any grab bars or handrails. First and last steps. Where the edge of each step is. Use tools that help you move around (mobility aids) if they are needed. These include: Canes. Walkers. Scooters. Crutches. Turn on the lights when you go into a dark area. Replace any light bulbs as soon as they burn out. Set up your furniture so you have a clear path. Avoid moving your furniture around. If any of your floors are uneven, fix them. If there are any pets around you, be aware of where they are. Review your medicines with your doctor. Some medicines can make you feel dizzy. This can increase your chance of falling. Ask your doctor what other things that you can do to help prevent falls. This information is not intended to replace advice given to you by your health care provider. Make sure you discuss any questions you have with your health care provider. Document Released: 11/25/2008 Document Revised: 07/07/2015 Document Reviewed: 03/05/2014 Elsevier Interactive Patient Education  2017 [employer].

## 2022-06-27 NOTE — Progress Notes (Addendum)
I connected with  Suzanne Spencer on 06/27/22 by a audio enabled telemedicine application and verified that I am speaking with the correct person using two identifiers.  Patient Location: Home  Provider Location: Home Office  I discussed the limitations of evaluation and management by telemedicine. The patient expressed understanding and agreed to proceed.   Subjective:   Suzanne Spencer is a 72 y.o. female who presents for Medicare Annual (Subsequent) preventive examination.  Review of Systems     Cardiac Risk Factors include: advanced age (>64men, >41 women)     Objective:    Today's Vitals   06/27/22 1123  Weight: 182 lb (82.6 kg)   Body mass index is 34.67 kg/m.     06/27/2022   11:35 AM 05/18/2020   10:33 AM 10/16/2016    9:12 AM  Advanced Directives  Does Patient Have a Medical Advance Directive? Yes Yes Yes  Type of Estate agent of [place];Living will Healthcare Power of Attorney Living will;Healthcare Power of Attorney  Does patient want to make changes to medical advance directive? No - Patient declined    Copy of Healthcare Power of Attorney in Chart? Yes - validated most recent copy scanned in chart (See row information) Yes - validated most recent copy scanned in chart (See row information) No - copy requested    Current Medications (verified) Outpatient Encounter Medications as of 06/27/2022  Medication Sig   buPROPion (WELLBUTRIN XL) 150 MG 24 hr tablet Take 1 tablet (150 mg total) by mouth daily.   Calcium Carbonate-Vitamin D 600-400 MG-UNIT per tablet Take 1 tablet by mouth daily.   Cyanocobalamin (VITAMIN B-12 PO) Take by mouth.   LORazepam (ATIVAN) 1 MG tablet 1-2 tabs po tid prn anxiety   Magnesium 500 MG CAPS Take 1 capsule by mouth daily.   Multiple Vitamin (MULTIVITAMIN) tablet Take 1 tablet by mouth daily.   Multiple Vitamins-Minerals (ZINC PO) Take by mouth.   omeprazole (PRILOSEC) 40 MG capsule TAKE 1 CAPSULE BY MOUTH TWICE A  DAY   PARoxetine (PAXIL) 30 MG tablet Take 1 tablet (30 mg total) by mouth daily.   raloxifene (EVISTA) 60 MG tablet TAKE 1 TABLET BY MOUTH EVERY DAY   No facility-administered encounter medications on file as of 06/27/2022.    Allergies (verified) Patient has no known allergies.   History: Past Medical History:  Diagnosis Date   Acute parotitis 11/2015   hx of:  sialolithiasis suspected by ENT--spont resolved.   Allergy    SEASONAL   Anxiety    Atrophic vaginitis    Cervical cancer screening    PAP normal 2017: GYN to repeat 2020.   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  Much improved at Dr. Michaelle Copas f/u 10/2015.   Hiatal hernia    Humerus fracture 12/19/2017   LEFT: mildly displaced comminuted fracture of the proximal left humerus.   Hyperlipidemia    LDL goal = < 140.  No meds as of 01/2021   Iron deficiency anemia Fall 2019   Celiac screen NEG, hemoccults neg.  Pt to get EGD and TCS 12/19/17.  Endoscopies cancelled b/c pt had horrible time w/prep and fell and broke humerus when trying to reach her  bathroom quickly--pt declines endosc as of 06/2018.  Labs stable/normal at that time.   Medial meniscus tear 04/2014   R knee   Osteoarthritis    KNEES   Osteoporosis    GYN managing; as of 01/2016 pt is on Evista.  She is intol of oral bisphosph due to GERD.   Other and unspecified ovarian cysts    Prediabetes 2018   A1c 6% 04/2016   Proximal humerus fracture 12/19/2017   Mildly displaced and comminuted proximal left humeral head and neck fracture.   Rib fracture 09/04/2017   Mildly displaced R 6th rib fx   Unilateral subjective nonpulsatile tinnitus without hearing loss, otoscopic finding, neurologic deficit, or head trauma    Past  Surgical History:  Procedure Laterality Date   ABDOMINAL SURGERY  2003   EXPLORATORY LAPAROTOMY, LSO   ADENOIDECTOMY  1954   Carotid duplex dopplers Bilateral 11/2020   <50% carotid bulb plaque bilat.   CARPAL TUNNEL RELEASE Bilateral 2001   CHOLECYSTECTOMY  1989   stones   COLONOSCOPY  2011; 03/2013   2011 NORMAL.  03/2013 TCS done for heme+ stool: no polyps.  Repeat 10 yrs (after 03/2023).   DEXA  03/23/2014; 2020   Osteopenia.  Pt took prolia x 2 injections, then was switched to evista by her GYN.  DEXA 01/17/16 T score a bit worse, same regimen rec'd, as of 02/2019 doing well on raloxifene, rpt DEXA planned in 1 yr. (by GYN)   ENDOMETRIAL BIOPSY  09/27/2015   PATH:  BENIGN   endoscopy upper GI  2000; 05/2014   negative 2000.  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 Russella Dar is PPI bid long term.   HYSTEROSCOPY     UTERINE POLYPS   MOUTH SURGERY     OOPHORECTOMY  2003   LSO WITH EXPLORATORY LAPAROTOMY   TUBAL LIGATION  1987   Family History  Problem Relation Age of Onset   Hyperlipidemia Mother    Anxiety disorder Mother    Depression Mother    Heart attack Father        abnormal EKG; ? AVR   Diabetes Father    Heart disease Father    Irregular heart beat Sister    Osteoporosis Maternal Grandmother    Heart disease Maternal Grandfather    Heart failure Maternal Grandfather    Colon cancer Neg Hx    Social History   Socioeconomic History   Marital status: Married    Spouse name: Not on file   Number of children: 1   Years of education: Not on file   Highest education level: Not on file  Occupational History   Occupation: retired    Associate Professor: NOT EMPLOYED  Tobacco Use   Smoking status: Never   Smokeless tobacco: Never  Vaping Use   Vaping Use: Never used  Substance and Sexual Activity   Alcohol use: Yes    Alcohol/week: 1.0 standard drink of alcohol    Types: 1 Glasses of wine per week    Drug use: No   Sexual activity: Yes    Birth control/protection: Surgical    Comment: older than 16, less than 5, BTL  Other Topics Concern   Not on file  Social History Narrative   Married, one son.   Orig from [place] area, lives in [place].   Occupation: retired from [employer] systems).   No tob, rare, alcohol, no drugs.  Exercise: stationary bike 6 days a week.  [facility]   Social Determinants of Health   Financial Resource Strain: Low Risk  (06/27/2022)   Overall Financial Resource Strain (CARDIA)    Difficulty of Paying Living Expenses: Not hard at all  Food Insecurity: No Food Insecurity (06/27/2022)   Hunger Vital Sign    Worried About Running Out of Food in the Last Year: Never true    Ran Out of Food in the Last Year: Never true  Transportation Needs: No Transportation Needs (06/27/2022)   PRAPARE - Administrator, Civil Service (Medical): No    Lack of Transportation (Non-Medical): No  Physical Activity: Insufficiently Active (06/27/2022)   Exercise Vital Sign    Days of Exercise per Week: 5 days    Minutes of Exercise per Session: 20 min  Stress: Stress Concern Present (06/27/2022)   [employer] of Occupational Health - Occupational Stress Questionnaire    Feeling of Stress : Rather much  Social Connections: Moderately Isolated (06/27/2022)   Social Connection and Isolation Panel [NHANES]    Frequency of Communication with Friends and Family: More than three times a week    Frequency of Social Gatherings with Friends and Family: More than three times a week    Attends Religious Services: Never    Database administrator or Organizations: No    Attends Engineer, structural: Never    Marital Status: Married    Tobacco Counseling Counseling given: Not Answered   Clinical Intake:  Pre-visit preparation completed: Yes  Pain : No/denies pain     BMI - recorded: 34.67 Nutritional Status: BMI > 30   Obese Nutritional Risks: None Diabetes: No  How often do you need to have someone help you when you read instructions, pamphlets, or other written materials from your doctor or pharmacy?: 1 - Never  Diabetic?no  Interpreter Needed?: No  Information entered by :: Lanier Ensign, LPN   Activities of Daily Living    06/27/2022   11:36 AM  In your present state of health, do you have any difficulty performing the following activities:  Hearing? 0  Vision? 0  Difficulty concentrating or making decisions? 0  Walking or climbing stairs? 0  Dressing or bathing? 0  Doing errands, shopping? 0  Preparing Food and eating ? N  Using the Toilet? N  In the past six months, have you accidently leaked urine? N  Do you have problems with loss of bowel control? N  Managing your Medications? N  Managing your Finances? N  Housekeeping or managing your Housekeeping? N    Patient Care Team: Jeoffrey Massed, MD as PCP - General (Family Medicine) Judi Saa, DO as Consulting Physician (Sports Medicine) Meryl Dare, MD as Consulting Physician (Gastroenterology) Suzanna Obey, MD as Consulting Physician (Otolaryngology) Milagros Evener, MD as Consulting Physician (Psychiatry) Genia Del, MD as Consulting Physician (Obstetrics and Gynecology) Rafael Bihari (Dentistry) Dermatology, Andre Lefort, Marye Round, MD as Consulting Physician (Sports Medicine)  Indicate any recent Medical Services you may have received from other than [facility] providers in the past year (date may be approximate).     Assessment:   This is a routine wellness examination for Suzanne Spencer.  Hearing/Vision screen Hearing Screening - Comments:: Pt denies any hearing issues  Vision Screening - Comments:: Pt follows up with [facility] for annual eye exams   Dietary issues and exercise activities discussed: Current Exercise Habits:  Home exercise routine, Type of exercise: Other - see comments, Time (Minutes):  20, Frequency (Times/Week): 5, Weekly Exercise (Minutes/Week): 100   Goals Addressed             This Visit's Progress    Patient Stated       Lose weight        Depression Screen    06/27/2022   11:32 AM 06/18/2022    8:19 AM 03/12/2022    8:59 AM 08/23/2021    9:57 AM 07/26/2021   10:41 AM 02/10/2021    8:52 AM 12/01/2020    9:31 AM  PHQ 2/9 Scores  PHQ - 2 Score 2 2 2 2 3  0 1  PHQ- 9 Score 8 9 6  8       Fall Risk    06/27/2022   11:36 AM 06/18/2022    8:19 AM 11/22/2021    9:02 AM 08/23/2021    9:57 AM 02/10/2021    8:52 AM  Fall Risk   Falls in the past year? 0 0 0 0 0  Number falls in past yr: 0 0 0 0 0  Injury with Fall? 0 0 0 0 0  Risk for fall due to : Impaired vision No Fall Risks No Fall Risks No Fall Risks No Fall Risks  Follow up Falls prevention discussed Falls evaluation completed Falls evaluation completed Falls evaluation completed Falls evaluation completed    FALL RISK PREVENTION PERTAINING TO THE HOME:  Any stairs in or around the home? Yes  If so, are there any without handrails? No  Home free of loose throw rugs in walkways, pet beds, electrical cords, etc? Yes  Adequate lighting in your home to reduce risk of falls? Yes   ASSISTIVE DEVICES UTILIZED TO PREVENT FALLS:  Life alert? No  Use of a cane, walker or w/c? No  Grab bars in the bathroom? Yes  Shower chair or bench in shower? No  Elevated toilet seat or a handicapped toilet? No   TIMED UP AND GO:  Was the test performed? No .   Cognitive Function: Declined         Immunizations Immunization History  Administered Date(s) Administered   Influenza Split 11/21/2010, 11/20/2011   Influenza Whole 11/26/2007, 11/18/2008, 11/08/2009   Influenza, High Dose Seasonal PF 10/16/2016, 10/11/2017, 10/18/2018, 09/30/2019   Influenza,inj,Quad PF,6+ Mos 11/25/2012, 10/21/2013, 10/26/2014, 11/01/2015   Pneumococcal Conjugate-13 10/13/2015   Pneumococcal Polysaccharide-23 10/16/2016   Tdap  05/16/2010, 02/14/2021   Zoster Recombinat (Shingrix) 08/04/2017, 09/29/2017   Zoster, Live 11/15/2009    TDAP status: Up to date  Flu Vaccine status: Declined, Education has been provided regarding the importance of this vaccine but patient still declined. Advised may receive this vaccine at local pharmacy or Health Dept. Aware to provide a copy of the vaccination record if obtained from local pharmacy or Health Dept. Verbalized acceptance and understanding.  Pneumococcal vaccine status: Up to date  Covid-19 vaccine status: Declined, Education has been provided regarding the importance of this vaccine but patient still declined. Advised may receive this vaccine at local pharmacy or Health Dept.or vaccine clinic. Aware to provide a copy of the vaccination record if obtained from local pharmacy or Health Dept. Verbalized acceptance and understanding.  Qualifies for Shingles Vaccine? Yes   Zostavax completed Yes   Shingrix Completed?: Yes  Screening Tests Health Maintenance  Topic Date Due   COVID-19 Vaccine (1) Never done   INFLUENZA VACCINE  09/13/2022   MAMMOGRAM  10/21/2022  COLONOSCOPY (Pts 45-72yrs Insurance coverage will need to be confirmed)  03/25/2023   Medicare Annual Wellness (AWV)  06/27/2023   DTaP/Tdap/Td (3 - Td or Tdap) 02/15/2031   Pneumonia Vaccine 72+ Years old  Completed   DEXA SCAN  Completed   Hepatitis C Screening  Completed   Zoster Vaccines- Shingrix  Completed   HPV VACCINES  Aged Out    Health Maintenance  Health Maintenance Due  Topic Date Due   COVID-19 Vaccine (1) Never done    Colorectal cancer screening: Type of screening: Colonoscopy. Completed 03/24/13. Repeat every 10 years  Mammogram status: Completed 10/20/21. Repeat every year  Bone Density status: Completed 04/26/20. Results reflect: Bone density results: OSTEOPENIA. Repeat every 2 years.   Additional Screening:  Hepatitis C Screening:  Completed 10/13/15  Vision Screening:  Recommended annual ophthalmology exams for early detection of glaucoma and other disorders of the eye. Is the patient up to date with their annual eye exam?  Yes  Who is the provider or what is the name of the office in which the patient attends annual eye exams? [facility]  If pt is not established with a provider, would they like to be referred to a provider to establish care? No .   Dental Screening: Recommended annual dental exams for proper oral hygiene  Community Resource Referral / Chronic Care Management: CRR required this visit?  No   CCM required this visit?  No      Plan:     I have personally reviewed and noted the following in the patient's chart:   Medical and social history Use of alcohol, tobacco or illicit drugs  Current medications and supplements including opioid prescriptions. Patient is not currently taking opioid prescriptions. Functional ability and status Nutritional status Physical activity Advanced directives List of other physicians Hospitalizations, surgeries, and ER visits in previous 12 months Vitals Screenings to include cognitive, depression, and falls Referrals and appointments  In addition, I have reviewed and discussed with patient certain preventive protocols, quality metrics, and best practice recommendations. A written personalized care plan for preventive services as well as general preventive health recommendations were provided to patient.     Marzella Schlein, LPN   [id number]/[id number]   Nurse Notes: Pt declined cognition, pt was alert and knowledgeable to questions asked

## 2022-07-10 ENCOUNTER — Encounter: Payer: Self-pay | Admitting: Family Medicine

## 2022-07-10 ENCOUNTER — Ambulatory Visit: Payer: Medicare Other | Admitting: Family Medicine

## 2022-07-10 VITALS — BP 96/60 | HR 82 | Wt 181.4 lb

## 2022-07-10 DIAGNOSIS — F3341 Major depressive disorder, recurrent, in partial remission: Secondary | ICD-10-CM | POA: Diagnosis not present

## 2022-07-10 DIAGNOSIS — F411 Generalized anxiety disorder: Secondary | ICD-10-CM | POA: Diagnosis not present

## 2022-07-10 MED ORDER — BUPROPION HCL ER (XL) 300 MG PO TB24: 300.0000 mg | ORAL_TABLET | Freq: Every day | ORAL | 0 refills | Status: DC

## 2022-07-10 NOTE — Progress Notes (Signed)
OFFICE VISIT  07/10/2022  CC:  Chief Complaint  Patient presents with   Follow-up    Dep/Anx   Patient is a 72 y.o. female who presents for 3-week follow-up depression. A/P as of last visit: "Recurrent major depressive disorder, severe, without psychosis. Wean Paxil slowly--30 mg daily x 2 weeks, then plan 20 mg daily x 2 weeks, then 10 mg daily x 2 weeks. Will start Wellbutrin XL 150 mg daily today. Therapeutic expectations and side effect profile of medication discussed today.  Patient's questions answered. She is currently not pursuing counseling--states she has looked into it extensively and it will cost too much."  INTERIM HX: Suzanne Spencer is happy to report that she is doing much better.  Sleeping better, less ruminating on her worries, mood is significantly improved, she has hope now. No side effects from Wellbutrin.   PMP AWARE reviewed today: most recent rx for lorazepam was filled 06/18/2022, # 180, rx by me. No red flags.  Past Medical History:  Diagnosis Date   Acute parotitis 11/2015   hx of:  sialolithiasis suspected by ENT--spont resolved.   Allergy    SEASONAL   Anxiety    Atrophic vaginitis    Cervical cancer screening    PAP normal 2017: GYN to repeat 2020.   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  Much improved at Dr. Michaelle Copas f/u 10/2015.   Hiatal hernia    Humerus fracture 12/19/2017   LEFT: mildly displaced comminuted fracture of the proximal left humerus.   Hyperlipidemia    LDL goal = < 140.  No meds as of 01/2021   Iron deficiency anemia Fall 2019   Celiac screen NEG, hemoccults neg.  Pt to get EGD and TCS 12/19/17.  Endoscopies cancelled b/c pt had horrible time w/prep and fell and broke humerus when trying to reach  her bathroom quickly--pt declines endosc as of 06/2018.  Labs stable/normal at that time.   Medial meniscus tear 04/2014   R knee   Osteoarthritis    KNEES   Osteoporosis    GYN managing; as of 01/2016 pt is on Evista.  She is intol of oral bisphosph due to GERD.   Other and unspecified ovarian cysts    Prediabetes 2018   A1c 6% 04/2016   Proximal humerus fracture 12/19/2017   Mildly displaced and comminuted proximal left humeral head and neck fracture.   Rib fracture 09/04/2017   Mildly displaced R 6th rib fx   Unilateral subjective nonpulsatile tinnitus without hearing loss, otoscopic finding, neurologic deficit, or head trauma     Past Surgical History:  Procedure Laterality Date   ABDOMINAL SURGERY  2003   EXPLORATORY LAPAROTOMY, LSO   ADENOIDECTOMY  1954   Carotid duplex dopplers Bilateral 11/2020   <50% carotid bulb plaque bilat.   CARPAL TUNNEL RELEASE Bilateral 2001   CHOLECYSTECTOMY  1989   stones   COLONOSCOPY  2011; 03/2013   2011 NORMAL.  03/2013 TCS done for heme+ stool: no polyps.  Repeat 10 yrs (after 03/2023).   DEXA  03/23/2014; 2020   Osteopenia.  Pt took prolia x 2 injections, then was switched to evista by her GYN.  DEXA 01/17/16 T score a bit worse,  same regimen rec'd, as of 02/2019 doing well on raloxifene, rpt DEXA planned in 1 yr. (by GYN)   ENDOMETRIAL BIOPSY  09/27/2015   PATH:  BENIGN   endoscopy upper GI  2000; 05/2014   negative 2000.  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 Russella Dar is PPI bid long term.   HYSTEROSCOPY     UTERINE POLYPS   MOUTH SURGERY     OOPHORECTOMY  2003   LSO WITH EXPLORATORY LAPAROTOMY   TUBAL LIGATION  1987    Outpatient Medications Prior to Visit  Medication Sig Dispense Refill   Calcium Carbonate-Vitamin D 600-400 MG-UNIT per tablet Take 1 tablet by mouth daily.     Cyanocobalamin (VITAMIN B-12 PO) Take by mouth.     LORazepam (ATIVAN) 1 MG tablet  1-2 tabs po tid prn anxiety 240 tablet 1   Magnesium 500 MG CAPS Take 1 capsule by mouth daily.     Multiple Vitamin (MULTIVITAMIN) tablet Take 1 tablet by mouth daily.     Multiple Vitamins-Minerals (ZINC PO) Take by mouth.     omeprazole (PRILOSEC) 40 MG capsule TAKE 1 CAPSULE BY MOUTH TWICE A DAY 180 capsule 1   raloxifene (EVISTA) 60 MG tablet TAKE 1 TABLET BY MOUTH EVERY DAY 90 tablet 4   buPROPion (WELLBUTRIN XL) 150 MG 24 hr tablet Take 1 tablet (150 mg total) by mouth daily. 30 tablet 0   PARoxetine (PAXIL) 30 MG tablet Take 1 tablet (30 mg total) by mouth daily. (Patient not taking: Reported on 07/10/2022) 15 tablet 0   No facility-administered medications prior to visit.    No Known Allergies  Review of Systems As per HPI  PE:    07/10/2022   11:13 AM 06/27/2022   11:23 AM 06/18/2022    8:16 AM  Vitals with BMI  Weight 181 lbs 6 oz 182 lbs 182 lbs 6 oz  Systolic 96  129  Diastolic 60  76  Pulse 82  95     Physical Exam  Gen: Alert, well appearing.  Patient is oriented to person, place, time, and situation. AFFECT: pleasant, lucid thought and speech. No further exam today  LABS:  Last CBC Lab Results  Component Value Date   WBC 5.8 03/12/2022   HGB 12.5 03/12/2022   HCT 37.8 03/12/2022   MCV 87.4 03/12/2022   MCH 28.9 06/27/2018   RDW 14.1 03/12/2022   PLT 152.0 03/12/2022   Last metabolic panel Lab Results  Component Value Date   GLUCOSE 94 03/12/2022   NA 142 03/12/2022   K 4.2 03/12/2022   CL 105 03/12/2022   CO2 28 03/12/2022   BUN 21 03/12/2022   CREATININE 0.84 03/12/2022   CALCIUM 9.0 03/12/2022   PROT 6.6 03/12/2022   ALBUMIN 4.1 03/12/2022   BILITOT 0.4 03/12/2022   ALKPHOS 92 03/12/2022   AST 39 (H) 03/12/2022   ALT 19 03/12/2022   IMPRESSION AND PLAN:  Recurrent major depressive disorder, in partial remission-->great response to Wellbutrin XL 150 mg daily. Anxiety stable as well. Will maximize Wellbutrin XL to 300 mg a day.   Continue lorazepam 1 mg, 1-2 3 times daily as needed.  An After Visit Summary was printed and given to the patient.  FOLLOW UP: Return for 6 wks f/u anx/dep (Ok to cancel 09/10/22 appt).  Signed:  Santiago Bumpers, MD           07/10/2022

## 2022-08-06 ENCOUNTER — Other Ambulatory Visit: Payer: Self-pay | Admitting: Family Medicine

## 2022-08-13 HISTORY — PX: CATARACT EXTRACTION: SUR2

## 2022-08-19 ENCOUNTER — Other Ambulatory Visit: Payer: Self-pay | Admitting: Family Medicine

## 2022-08-20 NOTE — Patient Instructions (Signed)

## 2022-08-23 ENCOUNTER — Encounter: Payer: Self-pay | Admitting: Family Medicine

## 2022-08-23 ENCOUNTER — Ambulatory Visit: Payer: Medicare Other | Admitting: Family Medicine

## 2022-08-23 VITALS — BP 125/80 | HR 84 | Wt 178.2 lb

## 2022-08-23 DIAGNOSIS — F3342 Major depressive disorder, recurrent, in full remission: Secondary | ICD-10-CM

## 2022-08-23 DIAGNOSIS — F411 Generalized anxiety disorder: Secondary | ICD-10-CM

## 2022-08-23 MED ORDER — BUPROPION HCL ER (XL) 300 MG PO TB24: 300.0000 mg | ORAL_TABLET | Freq: Every day | ORAL | 3 refills | Status: DC

## 2022-08-23 NOTE — Progress Notes (Signed)
OFFICE VISIT  08/23/2022  CC:  Chief Complaint  Patient presents with   Depression   Anxiety    Patient is a 72 y.o. female who presents for 6-week follow-up recurrent major depressive disorder. A/P as of last visit: "Recurrent major depressive disorder, in partial remission-->great response to Wellbutrin XL 150 mg daily. Anxiety stable as well. Will maximize Wellbutrin XL to 300 mg a day.  Continue lorazepam 1 mg, 1-2 3 times daily as needed."  INTERIM HX: Suzanne Spencer continues to improve. The strife with her son and daughter-in-law continues but t she and her husband are coping better and continue to modify their approach the situation in a healthy manner from an emotional and mental standpoint.   PMP AWARE reviewed today: most recent rx for lorazepam 1 mg was filled 08/10/2022, # 180, rx by me. No red flags.   Past Medical History:  Diagnosis Date   Acute parotitis 11/2015   hx of:  sialolithiasis suspected by ENT--spont resolved.   Allergy    SEASONAL   Anxiety    Atrophic vaginitis    Cervical cancer screening    PAP normal 2017: GYN to repeat 2020.   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  Much improved at Dr. Michaelle Copas f/u 10/2015.   Hiatal hernia    Humerus fracture 12/19/2017   LEFT: mildly displaced comminuted fracture of the proximal left humerus.   Hyperlipidemia    LDL goal = < 140.  No meds as of 01/2021   Iron deficiency anemia Fall 2019   Celiac screen NEG, hemoccults neg.  Pt to get EGD and TCS 12/19/17.  Endoscopies cancelled b/c pt had horrible time w/prep and fell and broke humerus when trying to reach her bathroom quickly--pt declines endosc as of 06/2018.  Labs stable/normal at that time.   Medial meniscus tear 04/2014    R knee   Osteoarthritis    KNEES   Osteoporosis    GYN managing; as of 01/2016 pt is on Evista.  She is intol of oral bisphosph due to GERD.   Other and unspecified ovarian cysts    Prediabetes 2018   A1c 6% 04/2016   Proximal humerus fracture 12/19/2017   Mildly displaced and comminuted proximal left humeral head and neck fracture.   Rib fracture 09/04/2017   Mildly displaced R 6th rib fx   Unilateral subjective nonpulsatile tinnitus without hearing loss, otoscopic finding, neurologic deficit, or head trauma     Past Surgical History:  Procedure Laterality Date   ABDOMINAL SURGERY  2003   EXPLORATORY LAPAROTOMY, LSO   ADENOIDECTOMY  1954   Carotid duplex dopplers Bilateral 11/2020   <50% carotid bulb plaque bilat.   CARPAL TUNNEL RELEASE Bilateral 2001   CHOLECYSTECTOMY  1989   stones   COLONOSCOPY  2011; 03/2013   2011 NORMAL.  03/2013 TCS done for heme+ stool: no polyps.  Repeat 10 yrs (after 03/2023).   DEXA  03/23/2014; 2020   Osteopenia.  Pt took prolia x 2 injections, then was switched to evista by her GYN.  DEXA 01/17/16 T score a bit worse, same regimen rec'd, as of 02/2019 doing well on raloxifene, rpt DEXA planned in 1 yr. (by GYN)  ENDOMETRIAL BIOPSY  09/27/2015   PATH:  BENIGN   endoscopy upper GI  2000; 05/2014   negative 2000.  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 Russella Dar is PPI bid long term.   HYSTEROSCOPY     UTERINE POLYPS   MOUTH SURGERY     OOPHORECTOMY  2003   LSO WITH EXPLORATORY LAPAROTOMY   TUBAL LIGATION  1987    Outpatient Medications Prior to Visit  Medication Sig Dispense Refill   Calcium Carbonate-Vitamin D 600-400 MG-UNIT per tablet Take 1 tablet by mouth daily.     Cyanocobalamin (VITAMIN B-12 PO) Take by mouth.     LORazepam (ATIVAN) 1 MG tablet 1-2 tabs po tid prn anxiety 240 tablet 1   Magnesium 500 MG CAPS Take 1 capsule by mouth daily.     Multiple Vitamin  (MULTIVITAMIN) tablet Take 1 tablet by mouth daily.     Multiple Vitamins-Minerals (ZINC PO) Take by mouth.     omeprazole (PRILOSEC) 40 MG capsule TAKE 1 CAPSULE BY MOUTH TWICE A DAY 180 capsule 1   raloxifene (EVISTA) 60 MG tablet TAKE 1 TABLET BY MOUTH EVERY DAY 90 tablet 4   buPROPion (WELLBUTRIN XL) 300 MG 24 hr tablet Take 1 tablet (300 mg total) by mouth daily. 90 tablet 0   No facility-administered medications prior to visit.    No Known Allergies  Review of Systems As per HPI  PE:    08/23/2022   10:17 AM 07/10/2022   11:13 AM 06/27/2022   11:23 AM  Vitals with BMI  Weight 178 lbs 3 oz 181 lbs 6 oz 182 lbs  Systolic 125 96   Diastolic 80 60   Pulse 84 82      Physical Exam  Gen: Alert, well appearing.  Patient is oriented to person, place, time, and situation. AFFECT: pleasant, lucid thought and speech. No further exam today  LABS:  Last metabolic panel Lab Results  Component Value Date   GLUCOSE 94 03/12/2022   NA 142 03/12/2022   K 4.2 03/12/2022   CL 105 03/12/2022   CO2 28 03/12/2022   BUN 21 03/12/2022   CREATININE 0.84 03/12/2022   GFR 69.46 03/12/2022   CALCIUM 9.0 03/12/2022   PROT 6.6 03/12/2022   ALBUMIN 4.1 03/12/2022   BILITOT 0.4 03/12/2022   ALKPHOS 92 03/12/2022   AST 39 (H) 03/12/2022   ALT 19 03/12/2022    IMPRESSION AND PLAN:  Recurrent major depressive disorder in remission. GAD much improved. Continue Wellbutrin XL 300 mg a day and lorazepam 1 mg tabs, 1-2 3 times daily as needed.  An After Visit Summary was printed and given to the patient.  FOLLOW UP: Return in about 3 months (around 11/23/2022) for routine chronic illness f/u. Next cpe 02/2023 Signed:  Santiago Bumpers, MD           08/23/2022

## 2022-08-29 ENCOUNTER — Ambulatory Visit: Payer: Medicare Other | Admitting: Family Medicine

## 2022-08-29 ENCOUNTER — Encounter: Payer: Self-pay | Admitting: Family Medicine

## 2022-08-29 VITALS — HR 98 | Wt 180.2 lb

## 2022-08-29 DIAGNOSIS — M1991 Primary osteoarthritis, unspecified site: Secondary | ICD-10-CM

## 2022-08-29 DIAGNOSIS — M25561 Pain in right knee: Secondary | ICD-10-CM | POA: Diagnosis not present

## 2022-08-29 DIAGNOSIS — M1711 Unilateral primary osteoarthritis, right knee: Secondary | ICD-10-CM | POA: Diagnosis not present

## 2022-08-29 MED ORDER — TRAMADOL HCL 50 MG PO TABS: ORAL_TABLET | ORAL | 0 refills | Status: DC

## 2022-08-29 NOTE — Progress Notes (Signed)
OFFICE VISIT  08/29/2022  CC:  Chief Complaint  Patient presents with   Knee Pain    Right knee; Started within the last couple days. Behind and front of knee. Wearing knee brace, pt states it is helping.     Patient is a 72 y.o. female who presents for right knee pain.  HPI: Onset 2 to 3 days ago pain in right knee all over. moderate to severe intensity, occasionally buckles when she tries to walk. Ibuprofen no help.  No preceding trauma or strain/sprain.  About a week ago she was doing some housework on her knees but did not recall it hurting until couple days ago. No swelling or redness. Wearing a knee stabilizer last couple of days.  She does have a history of medial meniscus tear and some osteoarthritis on the right but she has not had problems with the knee at all in the last several years.   Past Medical History:  Diagnosis Date   Acute parotitis 11/2015   hx of:  sialolithiasis suspected by ENT--spont resolved.   Allergy    SEASONAL   Anxiety    Atrophic vaginitis    Cervical cancer screening    PAP normal 2017: GYN to repeat 2020.   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  Much improved at Dr. Michaelle Copas f/u 10/2015.   Hiatal hernia    Humerus fracture 12/19/2017   LEFT: mildly displaced comminuted fracture of the proximal left humerus.   Hyperlipidemia    LDL goal = < 140.  No meds as of 01/2021   Iron deficiency anemia Fall 2019   Celiac screen NEG, hemoccults neg.  Pt to get EGD and TCS 12/19/17.  Endoscopies cancelled b/c pt had horrible time w/prep and fell and broke humerus when trying to reach her bathroom quickly--pt declines endosc as of 06/2018.  Labs stable/normal at that time.   Medial meniscus tear 04/2014   R knee    Osteoarthritis    KNEES   Osteoporosis    GYN managing; as of 01/2016 pt is on Evista.  She is intol of oral bisphosph due to GERD.   Other and unspecified ovarian cysts    Prediabetes 2018   A1c 6% 04/2016   Proximal humerus fracture 12/19/2017   Mildly displaced and comminuted proximal left humeral head and neck fracture.   Rib fracture 09/04/2017   Mildly displaced R 6th rib fx   Unilateral subjective nonpulsatile tinnitus without hearing loss, otoscopic finding, neurologic deficit, or head trauma     Past Surgical History:  Procedure Laterality Date   ABDOMINAL SURGERY  2003   EXPLORATORY LAPAROTOMY, LSO   ADENOIDECTOMY  1954   Carotid duplex dopplers Bilateral 11/2020   <50% carotid bulb plaque bilat.   CARPAL TUNNEL RELEASE Bilateral 2001   CHOLECYSTECTOMY  1989   stones   COLONOSCOPY  2011; 03/2013   2011 NORMAL.  03/2013 TCS done for heme+ stool: no polyps.  Repeat 10 yrs (after 03/2023).   DEXA  03/23/2014; 2020   Osteopenia.  Pt took prolia x 2 injections, then was switched to evista by her GYN.  DEXA 01/17/16 T score a bit worse, same regimen rec'd, as of 02/2019 doing  well on raloxifene, rpt DEXA planned in 1 yr. (by GYN)   ENDOMETRIAL BIOPSY  09/27/2015   PATH:  BENIGN   endoscopy upper GI  2000; 05/2014   negative 2000.  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 Russella Dar is PPI bid long term.   HYSTEROSCOPY     UTERINE POLYPS   MOUTH SURGERY     OOPHORECTOMY  2003   LSO WITH EXPLORATORY LAPAROTOMY   TUBAL LIGATION  1987    Outpatient Medications Prior to Visit  Medication Sig Dispense Refill   buPROPion (WELLBUTRIN XL) 300 MG 24 hr tablet Take 1 tablet (300 mg total) by mouth daily. 90 tablet 3   Calcium Carbonate-Vitamin D 600-400 MG-UNIT per tablet Take 1 tablet by mouth daily.     Cyanocobalamin (VITAMIN B-12 PO) Take by mouth.     LORazepam (ATIVAN) 1 MG tablet 1-2 tabs po tid prn anxiety  240 tablet 1   Magnesium 500 MG CAPS Take 1 capsule by mouth daily.     Multiple Vitamin (MULTIVITAMIN) tablet Take 1 tablet by mouth daily.     Multiple Vitamins-Minerals (ZINC PO) Take by mouth.     omeprazole (PRILOSEC) 40 MG capsule TAKE 1 CAPSULE BY MOUTH TWICE A DAY 180 capsule 1   raloxifene (EVISTA) 60 MG tablet TAKE 1 TABLET BY MOUTH EVERY DAY 90 tablet 4   No facility-administered medications prior to visit.    No Known Allergies  Review of Systems  As per HPI  PE:    08/29/2022   11:02 AM 08/23/2022   10:17 AM 07/10/2022   11:13 AM  Vitals with BMI  Weight 180 lbs 3 oz 178 lbs 3 oz 181 lbs 6 oz  Systolic  125 96  Diastolic  80 60  Pulse 98 84 82     Physical Exam  Gen: Alert, well appearing.  Patient is oriented to person, place, time, and situation. Right knee: No warmth, erythema, or notable swelling. Tender to palpation mostly over medial joint line and some more generalized in the medial aspect as well as over the pes anserine region. Active and passive knee flexion to 100 degrees, restricted by pain. Extension to 180 degrees.  No varus or valgus instability or increase in pain. Lachman's with firm endpoint.  Patella nontender. Popliteal fossa without mass or tenderness.  LABS:  Last metabolic panel Lab Results  Component Value Date   GLUCOSE 94 03/12/2022   NA 142 03/12/2022   K 4.2 03/12/2022   CL 105 03/12/2022   CO2 28 03/12/2022   BUN 21 03/12/2022   CREATININE 0.84 03/12/2022   GFR 69.46 03/12/2022   CALCIUM 9.0 03/12/2022   PROT 6.6 03/12/2022   ALBUMIN 4.1 03/12/2022   BILITOT 0.4 03/12/2022   ALKPHOS 92 03/12/2022   AST 39 (H) 03/12/2022   ALT 19 03/12/2022   IMPRESSION AND PLAN:  Acute right knee pain. Osteoarthritis right knee plus chronic right knee medial meniscus tear. (Bedside [facility] ultrasound today: Very mild suprapatellar effusion.   Quadriceps and patellar tendons normal.  Small chronic appearing tear in the medial meniscus,  general extrusion of medial meniscus present.  No PDI flow.  Lateral meniscus with generalized extrusion, no PDI flow.  Significant degenerative changes visible at medial and lateral joint lines. Medial and lateral collateral ligaments intact.  Biceps femoris tendon normal. Subtle hypoechoic changes around the insertion of the pes anserine tendons.)  Plan: Steroid injection today.  Knee radiographs ordered. Tramadol 50 mg, 1-2 twice daily as needed, #20. She can continue her knee stabilizer.  Ice recommended.  Ultrasound-guided injection is preferred based on studies that show increased duration, increased effect, greater accuracy, decreased procedural pain, increased response rate, and decreased cost with ultrasound-guided versus blind injection. Procedure: Real-time ultrasound guided injection of right knee suprapatellar pouch. Device: GE [employer] informed consent obtained.  Timeout conducted.  No overlying erythema, induration, or other signs of local infection. After sterile prep with Betadine, injected 3 cc of 1% plain lidocaine for anesthesia followed by a mixture of 40 mg Kenalog and 3 cc of 1% plain lidocaine.  Injectate seen filling suprapatellar pouch.. Patient tolerated the procedure well.  No immediate complications.  Post-injection care discussed. Advised to call if fever/chills, erythema, drainage, or persistent bleeding.  Impression: Technically successful ultrasound-guided injection.  An After Visit Summary was printed and given to the patient.  FOLLOW UP: Return for 1-2 wks f/u R knee.  Signed:  Santiago Bumpers, MD           08/29/2022

## 2022-08-30 ENCOUNTER — Ambulatory Visit (INDEPENDENT_AMBULATORY_CARE_PROVIDER_SITE_OTHER): Payer: Medicare Other

## 2022-08-30 DIAGNOSIS — M1991 Primary osteoarthritis, unspecified site: Secondary | ICD-10-CM | POA: Diagnosis not present

## 2022-08-30 DIAGNOSIS — M25561 Pain in right knee: Secondary | ICD-10-CM

## 2022-09-07 ENCOUNTER — Ambulatory Visit: Payer: Medicare Other | Admitting: Family Medicine

## 2022-09-07 ENCOUNTER — Encounter: Payer: Self-pay | Admitting: Family Medicine

## 2022-09-07 VITALS — BP 116/74 | HR 82 | Wt 180.0 lb

## 2022-09-07 DIAGNOSIS — M1711 Unilateral primary osteoarthritis, right knee: Secondary | ICD-10-CM

## 2022-09-07 NOTE — Progress Notes (Addendum)
OFFICE VISIT  09/07/2022  CC:  Chief Complaint  Patient presents with   Knee Pain    F/u. Pt states she has not had anymore pain since the injection. States she is doing a lot better.    Patient is a 72 y.o. female who presents for 9-day follow-up right knee pain.  R knee steroid injection was done at last visit. Tramadol rx'd.  INTERIM HX: Feeling good.  Has hardly had any pain since the injection. She is doing her regular activities.  She filled the tramadol prescription but has not had to take any.  The radiographs 08/30/22 have not been read by radiologist yet.  My interpretation is medial joint space narrowing, osteophytes medially and laterally.  This appears to have worsened compared to radiographs 09/10/2013.  Past Medical History:  Diagnosis Date   Acute parotitis 11/2015   hx of:  sialolithiasis suspected by ENT--spont resolved.   Allergy    SEASONAL   Anxiety    Atrophic vaginitis    Cervical cancer screening    PAP normal 2017: GYN to repeat 2020.   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  Much improved at Dr. Michaelle Copas f/u 10/2015.   Hiatal hernia    Humerus fracture 12/19/2017   LEFT: mildly displaced comminuted fracture of the proximal left humerus.   Hyperlipidemia    LDL goal = < 140.  No meds as of 01/2021   Iron deficiency anemia Fall 2019   Celiac screen NEG, hemoccults neg.  Pt to get EGD and TCS 12/19/17.  Endoscopies cancelled b/c pt had horrible time w/prep and fell and broke humerus when trying to reach her bathroom quickly--pt declines endosc as of 06/2018.  Labs stable/normal at that time.   Medial meniscus tear 04/2014   R knee   Osteoarthritis    KNEES   Osteoporosis    GYN managing; as of 01/2016 pt is on  Evista.  She is intol of oral bisphosph due to GERD.   Other and unspecified ovarian cysts    Prediabetes 2018   A1c 6% 04/2016   Proximal humerus fracture 12/19/2017   Mildly displaced and comminuted proximal left humeral head and neck fracture.   Rib fracture 09/04/2017   Mildly displaced R 6th rib fx   Unilateral subjective nonpulsatile tinnitus without hearing loss, otoscopic finding, neurologic deficit, or head trauma     Past Surgical History:  Procedure Laterality Date   ABDOMINAL SURGERY  2003   EXPLORATORY LAPAROTOMY, LSO   ADENOIDECTOMY  1954   Carotid duplex dopplers Bilateral 11/2020   <50% carotid bulb plaque bilat.   CARPAL TUNNEL RELEASE Bilateral 2001   CHOLECYSTECTOMY  1989   stones   COLONOSCOPY  2011; 03/2013   2011 NORMAL.  03/2013 TCS done for heme+ stool: no polyps.  Repeat 10 yrs (after 03/2023).   DEXA  03/23/2014; 2020   Osteopenia.  Pt took prolia x 2 injections, then was switched to evista by her GYN.  DEXA 01/17/16 T score a bit worse, same regimen rec'd, as of 02/2019 doing well on raloxifene, rpt DEXA planned in 1 yr. (by GYN)   ENDOMETRIAL BIOPSY  09/27/2015   PATH:  BENIGN   endoscopy  upper GI  2000; 05/2014   negative 2000.  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 Russella Dar is PPI bid long term.   HYSTEROSCOPY     UTERINE POLYPS   MOUTH SURGERY     OOPHORECTOMY  2003   LSO WITH EXPLORATORY LAPAROTOMY   TUBAL LIGATION  1987    Outpatient Medications Prior to Visit  Medication Sig Dispense Refill   buPROPion (WELLBUTRIN XL) 300 MG 24 hr tablet Take 1 tablet (300 mg total) by mouth daily. 90 tablet 3   Calcium Carbonate-Vitamin D 600-400 MG-UNIT per tablet Take 1 tablet by mouth daily.     Cyanocobalamin (VITAMIN B-12 PO) Take by mouth.     LORazepam (ATIVAN) 1 MG tablet 1-2 tabs po tid prn anxiety 240 tablet 1   Magnesium 500 MG CAPS Take 1 capsule by mouth daily.     Multiple  Vitamin (MULTIVITAMIN) tablet Take 1 tablet by mouth daily.     Multiple Vitamins-Minerals (ZINC PO) Take by mouth.     omeprazole (PRILOSEC) 40 MG capsule TAKE 1 CAPSULE BY MOUTH TWICE A DAY 180 capsule 1   raloxifene (EVISTA) 60 MG tablet TAKE 1 TABLET BY MOUTH EVERY DAY 90 tablet 4   traMADol (ULTRAM) 50 MG tablet 1-2 tabs p.o. twice daily as needed pain (Patient not taking: Reported on 09/07/2022) 20 tablet 0   No facility-administered medications prior to visit.    No Known Allergies  Review of Systems As per HPI  PE:    09/07/2022   10:48 AM 08/29/2022   11:02 AM 08/23/2022   10:17 AM  Vitals with BMI  Weight 180 lbs 180 lbs 3 oz 178 lbs 3 oz  Systolic 116  125  Diastolic 74  80  Pulse 82 98 84     Physical Exam  Gen: Alert, well appearing.  Patient is oriented to person, place, time, and situation. R knee: No erythema, warmth, or swelling.  Range of motion fully intact.  Mild tenderness to palpation around the medial aspect of the knee in general.  No instability.  LABS:  none  IMPRESSION AND PLAN:  Right knee pain, osteoarthritis. Significant improvement status post recent steroid injection.  An After Visit Summary was printed and given to the patient.  FOLLOW UP: No follow-ups on file. Next cpe 02/2023  Signed:  Santiago Bumpers, MD           09/07/2022

## 2022-09-10 ENCOUNTER — Ambulatory Visit: Payer: Medicare Other | Admitting: Family Medicine

## 2022-09-13 HISTORY — PX: CATARACT EXTRACTION: SUR2

## 2022-09-19 ENCOUNTER — Other Ambulatory Visit: Payer: Self-pay | Admitting: Family Medicine

## 2022-09-19 MED ORDER — TRIAMCINOLONE ACETONIDE 40 MG/ML IJ SUSP
40.0000 mg | Freq: Once | INTRAMUSCULAR | Status: DC
Start: 2022-09-19 — End: 2022-09-19

## 2022-09-19 MED ORDER — TRIAMCINOLONE ACETONIDE 40 MG/ML IJ SUSP
40.0000 mg | Freq: Once | INTRAMUSCULAR | Status: AC
Start: 2022-09-19 — End: 2022-08-29
  Administered 2022-08-29: 40 mg via INTRA_ARTICULAR

## 2022-09-19 NOTE — Addendum Note (Signed)
Addended by: Filomena Jungling on: 09/19/2022 04:02 PM   Modules accepted: Orders

## 2022-09-19 NOTE — Addendum Note (Signed)
Addended by: Arty Baumgartner A on: 09/19/2022 02:13 PM   Modules accepted: Orders

## 2022-10-08 ENCOUNTER — Other Ambulatory Visit: Payer: Self-pay | Admitting: Family Medicine

## 2022-11-15 LAB — HM MAMMOGRAPHY

## 2022-11-16 ENCOUNTER — Encounter: Payer: Self-pay | Admitting: Family Medicine

## 2022-11-23 ENCOUNTER — Encounter: Payer: Self-pay | Admitting: Family Medicine

## 2022-11-23 ENCOUNTER — Ambulatory Visit: Payer: Medicare Other | Admitting: Family Medicine

## 2022-11-23 VITALS — BP 129/78 | HR 92 | Temp 99.1°F | Ht 60.75 in | Wt 181.6 lb

## 2022-11-23 DIAGNOSIS — F411 Generalized anxiety disorder: Secondary | ICD-10-CM

## 2022-11-23 DIAGNOSIS — F3342 Major depressive disorder, recurrent, in full remission: Secondary | ICD-10-CM

## 2022-11-23 NOTE — Progress Notes (Signed)
OFFICE VISIT  01/25/2023  CC:  Chief Complaint  Patient presents with   Medical Management of Chronic Issues    Patient is a 72 y.o. female who presents for 21-month follow-up recurrent major depressive disorder and generalized anxiety disorder. A/P as of last visit: "Recurrent major depressive disorder in remission. GAD much improved. Continue Wellbutrin XL 300 mg a day and lorazepam 1 mg tabs, 1-2 3 times daily as needed."   INTERIM HX: Anxiety is worse.  Wakes up in a panic and sweating. Mood is not significantly depressed. Biggest issue is still a very strained relationship with her daughter's family.  She takes 2 over 1 mg lorazepam tablets 3 times a day.  PMP AWARE reviewed today: most recent rx for lorazepam 1 mg was filled 11/20/2022, # 180, rx by me. No red flags.  Past Medical History:  Diagnosis Date   Acute parotitis 11/2015   hx of:  sialolithiasis suspected by ENT--spont resolved.   Allergy    SEASONAL   Anxiety    Atrophic vaginitis    Cervical cancer screening    PAP normal 2017: GYN to repeat 2020.   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  Much improved at Dr. Michaelle Copas f/u 10/2015.   Hiatal hernia    Humerus fracture 12/19/2017   LEFT: mildly displaced comminuted fracture of the proximal left humerus.   Hyperlipidemia    LDL goal = < 140.  No meds as of 01/2021   Iron deficiency anemia Fall 2019   Celiac screen NEG, hemoccults neg.  Pt to get EGD and TCS 12/19/17.  Endoscopies cancelled b/c pt had horrible time w/prep and fell and broke humerus when trying to reach her bathroom quickly--pt declines endosc as of 06/2018.  Labs stable/normal at that time.   Medial meniscus tear 04/2014   R knee   Osteoarthritis    KNEES    Osteoporosis    GYN managing; as of 01/2016 pt is on Evista.  She is intol of oral bisphosph due to GERD.   Other and unspecified ovarian cysts    Prediabetes 2018   A1c 6% 04/2016   Proximal humerus fracture 12/19/2017   Mildly displaced and comminuted proximal left humeral head and neck fracture.   Rib fracture 09/04/2017   Mildly displaced R 6th rib fx   Unilateral subjective nonpulsatile tinnitus without hearing loss, otoscopic finding, neurologic deficit, or head trauma     Past Surgical History:  Procedure Laterality Date   ABDOMINAL SURGERY  2003   EXPLORATORY LAPAROTOMY, LSO   ADENOIDECTOMY  1954   Carotid duplex dopplers Bilateral 11/2020   <50% carotid bulb plaque bilat.   CARPAL TUNNEL RELEASE Bilateral 2001   CATARACT EXTRACTION Left 08/2022   [facility], Dr.Steven [facility]   CATARACT EXTRACTION Right 09/2022   [facility], Dr.Steven [facility]   CHOLECYSTECTOMY  1989   stones   COLONOSCOPY  2011; 03/2013   2011 NORMAL.  03/2013 TCS done for heme+ stool: no polyps.  Repeat 10 yrs (after 03/2023).   DEXA  03/23/2014; 2020   Osteopenia.  Pt took prolia x 2 injections, then was switched to evista by her GYN.  DEXA 01/17/16 T score a  bit worse, same regimen rec'd, as of 02/2019 doing well on raloxifene, rpt DEXA planned in 1 yr. (by GYN)   ENDOMETRIAL BIOPSY  09/27/2015   PATH:  BENIGN   endoscopy upper GI  2000; 05/2014   negative 2000.  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 Russella Dar is PPI bid long term.   HYSTEROSCOPY     UTERINE POLYPS   MOUTH SURGERY     OOPHORECTOMY  2003   LSO WITH EXPLORATORY LAPAROTOMY   TUBAL LIGATION  1987    Outpatient Medications Prior to Visit  Medication Sig Dispense Refill   buPROPion (WELLBUTRIN XL) 300 MG 24 hr tablet Take 1 tablet (300 mg total) by mouth daily. 90 tablet 3   Calcium Carbonate-Vitamin D 600-400 MG-UNIT per tablet Take 1 tablet by  mouth daily.     Cyanocobalamin (VITAMIN B-12 PO) Take by mouth.     Magnesium 500 MG CAPS Take 1 capsule by mouth daily.     Multiple Vitamin (MULTIVITAMIN) tablet Take 1 tablet by mouth daily.     Multiple Vitamins-Minerals (ZINC PO) Take by mouth.     omeprazole (PRILOSEC) 40 MG capsule TAKE 1 CAPSULE BY MOUTH TWICE A DAY 180 capsule 1   raloxifene (EVISTA) 60 MG tablet TAKE 1 TABLET BY MOUTH EVERY DAY 90 tablet 4   traMADol (ULTRAM) 50 MG tablet 1-2 tabs p.o. twice daily as needed pain (Patient not taking: Reported on 09/07/2022) 20 tablet 0   LORazepam (ATIVAN) 1 MG tablet TAKE 1-2 TABLETS BY MOUTH 3 TIMES A DAY AS NEEDED FOR ANXIETY 180 tablet 3   No facility-administered medications prior to visit.    No Known Allergies  Review of Systems As per HPI  PE:    11/23/2022   10:23 AM 09/07/2022   10:48 AM 08/29/2022   11:02 AM  Vitals with BMI  Height 5' 0.75"    Weight 181 lbs 10 oz 180 lbs 180 lbs 3 oz  BMI 34.6    Systolic 129 116   Diastolic 78 74   Pulse 92 82 98     Physical Exam  Gen: Alert, well appearing.  Patient is oriented to person, place, time, and situation. AFFECT: pleasant, lucid thought and speech. No further exam today  LABS:  Last metabolic panel Lab Results  Component Value Date   GLUCOSE 94 03/12/2022   NA 142 03/12/2022   K 4.2 03/12/2022   CL 105 03/12/2022   CO2 28 03/12/2022   BUN 21 03/12/2022   CREATININE 0.84 03/12/2022   GFR 69.46 03/12/2022   CALCIUM 9.0 03/12/2022   PROT 6.6 03/12/2022   ALBUMIN 4.1 03/12/2022   BILITOT 0.4 03/12/2022   ALKPHOS 92 03/12/2022   AST 39 (H) 03/12/2022   ALT 19 03/12/2022   IMPRESSION AND PLAN:  GAD, lots of situational anxiety due to strained relationship with her daughter's family. Lorazepam at the 2 mg dose 3 times a day is helpful. At the time of next lorazepam fill she will call and request and I'll change her to the 2mg  strength tab and she'll take 1 tab tid.  #2 recurrent major  depressive disorder. In remission. Continue Wellbutrin XL 300 mg a day.  An After Visit Summary was printed and given to the patient.  FOLLOW UP: Return in about 3 months (around 02/23/2023) for annual CPE (fasting). Next CPE 02/2023 Signed:  Santiago Bumpers, MD  01/25/2023  

## 2022-12-18 ENCOUNTER — Telehealth: Payer: Self-pay

## 2022-12-18 NOTE — Telephone Encounter (Signed)
Requesting: LORazepam (ATIVAN) 2 MG tablet  Contract: 11/30/22 (1mg  dose) UDS: 11/22/21 Last Visit: 11/23/22 Next Visit: 03/14/23 Last Refill: 09/19/22 (180,3)  Please Advise. Med not pending, pt requesting increase from current dose (1mg ) to 2mg 

## 2022-12-18 NOTE — Telephone Encounter (Signed)
Patient stated that Dr. Milinda Cave she could increase to 2mg  this fill if she wanted.  She is requesting to increasing med.  Prescription Request  12/18/2022  LOV: Visit date not found  What is the name of the medication or equipment? LORazepam (ATIVAN) 2 MG tablet   Have you contacted your pharmacy to request a refill? No   Which pharmacy would you like this sent to?  [phone] [place], [place] - [address] [place] [address] Phone: [phone] Fax: [phone]    Patient notified that their request is being sent to the clinical staff for review and that they should receive a response within 2 business days.   Please advise at Mobile [phone] (mobile)

## 2022-12-19 MED ORDER — LORAZEPAM 2 MG PO TABS: ORAL_TABLET | ORAL | 5 refills | Status: DC

## 2022-12-19 NOTE — Telephone Encounter (Signed)
Okay, 2 mg lorazepam prescription sent

## 2023-01-21 ENCOUNTER — Telehealth: Payer: Self-pay | Admitting: Family Medicine

## 2023-01-21 NOTE — Telephone Encounter (Signed)
Patient called to inquire about taking/ increasing the dosage of her anxiety medication LORazepam (ATIVAN) 2 MG tablet. She reports that it seems like 3 pills is not enough to treat her anxiety and feels like 4  pills will make a difference. Patient is aware an appointment may be needed to discuss medication increase. Please give the patient a call to discuss possible increase in medication dosage.

## 2023-01-22 NOTE — Telephone Encounter (Signed)
No further action needed.

## 2023-01-25 ENCOUNTER — Ambulatory Visit: Payer: Medicare Other | Admitting: Family Medicine

## 2023-01-25 ENCOUNTER — Encounter: Payer: Self-pay | Admitting: Family Medicine

## 2023-01-25 VITALS — BP 146/81 | HR 94 | Wt 183.6 lb

## 2023-01-25 DIAGNOSIS — F411 Generalized anxiety disorder: Secondary | ICD-10-CM | POA: Diagnosis not present

## 2023-01-25 DIAGNOSIS — Z79899 Other long term (current) drug therapy: Secondary | ICD-10-CM | POA: Diagnosis not present

## 2023-01-25 DIAGNOSIS — F410 Panic disorder [episodic paroxysmal anxiety] without agoraphobia: Secondary | ICD-10-CM

## 2023-01-25 MED ORDER — ESCITALOPRAM OXALATE 5 MG PO TABS: 5.0000 mg | ORAL_TABLET | Freq: Every day | ORAL | 0 refills | Status: DC

## 2023-01-25 MED ORDER — LORAZEPAM 2 MG PO TABS: ORAL_TABLET | ORAL | 5 refills | Status: DC

## 2023-01-25 NOTE — Progress Notes (Signed)
OFFICE VISIT  01/25/2023  CC:  Chief Complaint  Patient presents with   Anxiety    Anxiety has increased; Pt wants to discuss a dosage increase of Lorazepam.    Patient is a 72 y.o. female who presents for follow-up anxiety. A/P as of last visit on 11/23/22: "GAD, lots of situational anxiety due to strained relationship with her daughter's family. Lorazepam at the 2 mg dose 3 times a day is helpful. At the time of next lorazepam fill she will call and request and I'll change her to the 2mg  strength tab and she'll take 1 tab tid.   #2 recurrent major depressive disorder. In remission. Continue Wellbutrin XL 300 mg a day"  INTERIM HX: Severe anxiety chronic, has superimposed marked increase in anxiety levels nearing panic. She has had significant improvement with lorazepam but over time she has shown tolerance. She has taken 2 of her 2 mg tabs at the same time recently and noted significant improvement in her anxiety. When she takes one of the tabs at a time she says that she does not feel any improvement at all.  Denies any depressed mood.  Again, she reiterated that her chronic severe anxiety has been lifelong.  PMP AWARE reviewed today: most recent rx for lorazepam 2 mg was filled 01/15/2023, # 90, rx by me. No red flags.  Past Medical History:  Diagnosis Date   Acute parotitis 11/2015   hx of:  sialolithiasis suspected by ENT--spont resolved.   Allergy    SEASONAL   Anxiety    Atrophic vaginitis    Cervical cancer screening    PAP normal 2017: GYN to repeat 2020.   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  Much improved at Dr. Michaelle Copas f/u 10/2015.   Hiatal hernia    Humerus fracture 12/19/2017   LEFT: mildly displaced comminuted  fracture of the proximal left humerus.   Hyperlipidemia    LDL goal = < 140.  No meds as of 01/2021   Iron deficiency anemia Fall 2019   Celiac screen NEG, hemoccults neg.  Pt to get EGD and TCS 12/19/17.  Endoscopies cancelled b/c pt had horrible time w/prep and fell and broke humerus when trying to reach her bathroom quickly--pt declines endosc as of 06/2018.  Labs stable/normal at that time.   Medial meniscus tear 04/2014   R knee   Osteoarthritis    KNEES   Osteoporosis    GYN managing; as of 01/2016 pt is on Evista.  She is intol of oral bisphosph due to GERD.   Other and unspecified ovarian cysts    Prediabetes 2018   A1c 6% 04/2016   Proximal humerus fracture 12/19/2017   Mildly displaced and comminuted proximal left humeral head and neck fracture.   Rib fracture 09/04/2017   Mildly displaced R 6th rib fx   Unilateral subjective nonpulsatile tinnitus without hearing loss, otoscopic finding, neurologic deficit, or head trauma     Past Surgical History:  Procedure Laterality Date   ABDOMINAL SURGERY  2003   EXPLORATORY LAPAROTOMY, LSO   ADENOIDECTOMY  1954   Carotid duplex dopplers Bilateral 11/2020   <50% carotid bulb plaque bilat.   CARPAL TUNNEL RELEASE Bilateral 2001  CATARACT EXTRACTION Left 08/2022   [facility], Dr.Steven [facility]   CATARACT EXTRACTION Right 09/2022   [facility], Dr.Steven [facility]   CHOLECYSTECTOMY  1989   stones   COLONOSCOPY  2011; 03/2013   2011 NORMAL.  03/2013 TCS done for heme+ stool: no polyps.  Repeat 10 yrs (after 03/2023).   DEXA  03/23/2014; 2020   Osteopenia.  Pt took prolia x 2 injections, then was switched to evista by her GYN.  DEXA 01/17/16 T score a bit worse, same regimen rec'd, as of 02/2019 doing well on raloxifene, rpt DEXA planned in 1 yr. (by GYN)   ENDOMETRIAL BIOPSY  09/27/2015   PATH:  BENIGN   endoscopy upper GI  2000; 05/2014   negative 2000.  In 2016 she had irreg z-line biopsied and was noted to have acquired  stenosis of 2nd part of duodenum--plan per Dr. Russella Dar is PPI bid long term.   HYSTEROSCOPY     UTERINE POLYPS   MOUTH SURGERY     OOPHORECTOMY  2003   LSO WITH EXPLORATORY LAPAROTOMY   TUBAL LIGATION  1987    Outpatient Medications Prior to Visit  Medication Sig Dispense Refill   buPROPion (WELLBUTRIN XL) 300 MG 24 hr tablet Take 1 tablet (300 mg total) by mouth daily. 90 tablet 3   Calcium Carbonate-Vitamin D 600-400 MG-UNIT per tablet Take 1 tablet by mouth daily.     Cyanocobalamin (VITAMIN B-12 PO) Take by mouth.     Magnesium 500 MG CAPS Take 1 capsule by mouth daily.     Multiple Vitamin (MULTIVITAMIN) tablet Take 1 tablet by mouth daily.     Multiple Vitamins-Minerals (ZINC PO) Take by mouth.     omeprazole (PRILOSEC) 40 MG capsule TAKE 1 CAPSULE BY MOUTH TWICE A DAY 180 capsule 1   raloxifene (EVISTA) 60 MG tablet TAKE 1 TABLET BY MOUTH EVERY DAY 90 tablet 4   LORazepam (ATIVAN) 2 MG tablet 1 tab po tid prn anxiety 90 tablet 5   traMADol (ULTRAM) 50 MG tablet 1-2 tabs p.o. twice daily as needed pain (Patient not taking: Reported on 01/25/2023) 20 tablet 0   No facility-administered medications prior to visit.    No Known Allergies  Review of Systems As per HPI  PE:    01/25/2023    9:59 AM 11/23/2022   10:23 AM 09/07/2022   10:48 AM  Vitals with BMI  Height  5' 0.75"   Weight 183 lbs 10 oz 181 lbs 10 oz 180 lbs  BMI  34.6   Systolic 146 129 469  Diastolic 81 78 74  Pulse 94 92 82     Physical Exam  Gen: Alert, well appearing.  Patient is oriented to person, place, time, and situation. AFFECT: pleasant, slightly anxious, lucid thought and speech. No further exam today  LABS:  Last metabolic panel Lab Results  Component Value Date   GLUCOSE 94 03/12/2022   NA 142 03/12/2022   K 4.2 03/12/2022   CL 105 03/12/2022   CO2 28 03/12/2022   BUN 21 03/12/2022   CREATININE 0.84 03/12/2022   GFR 69.46 03/12/2022   CALCIUM 9.0 03/12/2022   PROT 6.6 03/12/2022    ALBUMIN 4.1 03/12/2022   BILITOT 0.4 03/12/2022   ALKPHOS 92 03/12/2022   AST 39 (H) 03/12/2022   ALT 19 03/12/2022   IMPRESSION AND PLAN:  #1 chronic severe generalized anxiety with superimposed panic attacks. She is showing tolerance to lorazepam. Unfortunately she has failed many  SSRI trials in the past as well as some trials of atypical antipsychotics as well as Lamictal. Discussed the importance of avoiding overuse of benzo both for safety reasons and to try to minimize tolerance over time. Today I decided to prescribe an additional 15 of the 2 mg lorazepam tabs each month so that she can take 1 additional tab in a 24-hour period as needed for acute worsening of her anxiety.  She understands that she can only do this a maximum of 15 days/month.  She should not take more than 4 tabs in a 24-hour period.  Additionally, after reviewing all of her old medication trials and failures (sertraline, desvenlafaxine, Prozac, Lamictal, and olanzapine all in 2012.  Effexor in 2015.  Seroquel in 2017/2018.  Trintellix in 2020.  Paxil in 2023.) I decided to start Lexapro 5 mg a day today. Therapeutic expectations and side effect profile of medication discussed today.  Patient's questions answered.  #2 recurrent major depressive disorder. In remission on Wellbutrin XL 300 mg a day.  An After Visit Summary was printed and given to the patient.  FOLLOW UP: Return in about 3 weeks (around 02/15/2023) for routine chronic illness f/u.  Signed:  Santiago Bumpers, MD           01/25/2023

## 2023-01-27 LAB — DRUG MONITORING PANEL 376104, URINE
Alphahydroxyalprazolam: NEGATIVE ng/mL (ref <25)
Alphahydroxymidazolam: NEGATIVE ng/mL (ref <50)
Alphahydroxytriazolam: NEGATIVE ng/mL (ref <50)
Aminoclonazepam: NEGATIVE ng/mL (ref <25)
Amphetamines: NEGATIVE ng/mL (ref <500)
Barbiturates: NEGATIVE ng/mL (ref <300)
Benzodiazepines: POSITIVE ng/mL — AB (ref <100)
Cocaine Metabolite: NEGATIVE ng/mL (ref <150)
Desmethyltramadol: NEGATIVE ng/mL (ref <100)
Hydroxyethylflurazepam: NEGATIVE ng/mL (ref <50)
Lorazepam: 3021 ng/mL — ABNORMAL HIGH (ref <50)
Nordiazepam: NEGATIVE ng/mL (ref <50)
Opiates: NEGATIVE ng/mL (ref <100)
Oxazepam: NEGATIVE ng/mL (ref <50)
Oxycodone: NEGATIVE ng/mL (ref <100)
Temazepam: NEGATIVE ng/mL (ref <50)
Tramadol: NEGATIVE ng/mL (ref <100)

## 2023-01-27 LAB — DM TEMPLATE

## 2023-02-13 ENCOUNTER — Other Ambulatory Visit: Payer: Self-pay | Admitting: Family Medicine

## 2023-02-21 ENCOUNTER — Ambulatory Visit: Payer: Medicare Other | Admitting: Family Medicine

## 2023-02-21 NOTE — Progress Notes (Signed)
 OFFICE VISIT  02/22/2023  CC:  Chief Complaint  Patient presents with   Anxiety    Patient is a 73 y.o. female who presents for 3 wk f/u anxiety. Last visit I started her on lexapro  5mg  every day.  Also ok to take an extra tab of loraz  2mg  in a 24h period as needed, max of 15 extra tabs per month. We continued her on her wellbutrin .  INTERIM HX: She feels significantly improved. She still has some period where her anxiety level gets significantly up a couple times a day but not nearly as high or frequent. Mood is stable.  Sleep is improved. No side effects from Lexapro . She has not had to take any extra lorazepam  since last visit.  Past Medical History:  Diagnosis Date   Acute parotitis 11/2015   hx of:  sialolithiasis suspected by ENT--spont resolved.   Allergy    SEASONAL   Anxiety    Atrophic vaginitis    Cervical cancer screening    PAP normal 2017: GYN to repeat 2020.   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  Much improved at Dr. Theressa f/u 10/2015.   Hiatal hernia    Humerus fracture 12/19/2017   LEFT: mildly displaced comminuted fracture of the proximal left humerus.   Hyperlipidemia    LDL goal = < 140.  No meds as of 01/2021   Iron deficiency anemia Fall 2019   Celiac screen NEG, hemoccults neg.  Pt to get EGD and TCS 12/19/17.  Endoscopies cancelled b/c pt had horrible time w/prep and fell and broke humerus when trying to reach her bathroom quickly--pt declines endosc as of 06/2018.  Labs stable/normal at that time.   Medial meniscus tear 04/2014   R knee   Osteoarthritis    KNEES   Osteoporosis    GYN managing; as of 01/2016 pt is on Evista .  She is intol of oral bisphosph due to GERD.   Other and unspecified ovarian cysts     Prediabetes 2018   A1c 6% 04/2016   Proximal humerus fracture 12/19/2017   Mildly displaced and comminuted proximal left humeral head and neck fracture.   Rib fracture 09/04/2017   Mildly displaced R 6th rib fx   Unilateral subjective nonpulsatile tinnitus without hearing loss, otoscopic finding, neurologic deficit, or head trauma     Past Surgical History:  Procedure Laterality Date   ABDOMINAL SURGERY  2003   EXPLORATORY LAPAROTOMY, LSO   ADENOIDECTOMY  1954   Carotid duplex dopplers Bilateral 11/2020   <50% carotid bulb plaque bilat.   CARPAL TUNNEL RELEASE Bilateral 2001   CATARACT EXTRACTION Left 08/2022   [facility], Dr.Steven [facility]   CATARACT EXTRACTION Right 09/2022   [facility], Dr.Steven [facility]   CHOLECYSTECTOMY  1989   stones   COLONOSCOPY  2011; 03/2013   2011 NORMAL.  03/2013 TCS done for heme+ stool: no polyps.  Repeat 10 yrs (after 03/2023).   DEXA  03/23/2014; 2020   Osteopenia.  Pt took prolia  x 2 injections, then was switched to evista  by her GYN.  DEXA 01/17/16 T score a bit worse, same regimen rec'd, as of 02/2019 doing well on raloxifene , rpt DEXA planned  in 1 yr. (by GYN)   ENDOMETRIAL BIOPSY  09/27/2015   PATH:  BENIGN   endoscopy upper GI  2000; 05/2014   negative 2000.  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 Aneita is PPI bid long term.   HYSTEROSCOPY     UTERINE POLYPS   MOUTH SURGERY     OOPHORECTOMY  2003   LSO WITH EXPLORATORY LAPAROTOMY   TUBAL LIGATION  1987    Outpatient Medications Prior to Visit  Medication Sig Dispense Refill   buPROPion  (WELLBUTRIN  XL) 300 MG 24 hr tablet Take 1 tablet (300 mg total) by mouth daily. 90 tablet 3   Calcium Carbonate-Vitamin D  600-400 MG-UNIT per tablet Take 1 tablet by mouth daily.     Cyanocobalamin (VITAMIN B-12 PO) Take by mouth.     LORazepam  (ATIVAN ) 2 MG tablet 1 tab po tid prn anxiety.  May take an additional  tab every 24 hours as needed for extreme anxiety 105 tablet 5   Magnesium 500 MG CAPS Take 1 capsule by mouth daily.     Multiple Vitamin (MULTIVITAMIN) tablet Take 1 tablet by mouth daily.     Multiple Vitamins-Minerals (ZINC PO) Take by mouth.     omeprazole  (PRILOSEC) 40 MG capsule TAKE 1 CAPSULE BY MOUTH TWICE A DAY 180 capsule 1   raloxifene  (EVISTA ) 60 MG tablet TAKE 1 TABLET BY MOUTH EVERY DAY 90 tablet 4   escitalopram  (LEXAPRO ) 5 MG tablet TAKE 1 TABLET (5 MG TOTAL) BY MOUTH DAILY. 30 tablet 0   No facility-administered medications prior to visit.    No Known Allergies  Review of Systems As per HPI  PE:    02/22/2023   10:39 AM 01/25/2023    9:59 AM 11/23/2022   10:23 AM  Vitals with BMI  Height   5' 0.75  Weight 183 lbs 10 oz 183 lbs 10 oz 181 lbs 10 oz  BMI   34.6  Systolic 130 146 870  Diastolic 75 81 78  Pulse 83 94 92   Physical Exam  Gen: Alert, well appearing.  Patient is oriented to person, place, time, and situation. AFFECT: pleasant, lucid thought and speech. No further exam today.  LABS:  Last metabolic panel Lab Results  Component Value Date   GLUCOSE 94 03/12/2022   NA 142 03/12/2022   K 4.2 03/12/2022   CL 105 03/12/2022   CO2 28 03/12/2022   BUN 21 03/12/2022   CREATININE 0.84 03/12/2022   GFR 69.46 03/12/2022   CALCIUM 9.0 03/12/2022   PROT 6.6 03/12/2022   ALBUMIN 4.1 03/12/2022   BILITOT 0.4 03/12/2022   ALKPHOS 92 03/12/2022   AST 39 (H) 03/12/2022   ALT 19 03/12/2022   IMPRESSION AND PLAN:  GAD. Improved with recent addition of Lexapro  5 mg a day. Will go ahead and increase Lexapro  to 10 mg a day. Continue Wellbutrin  XL 300 mg a day and lorazepam  on a scheduled basis.  An After Visit Summary was printed and given to the patient.  FOLLOW UP: Return for keep appt set for CPE 03/14/23.  Signed:  Gerlene Hockey, MD           02/22/2023

## 2023-02-22 ENCOUNTER — Encounter: Payer: Self-pay | Admitting: Family Medicine

## 2023-02-22 ENCOUNTER — Ambulatory Visit: Payer: Medicare Other | Admitting: Family Medicine

## 2023-02-22 VITALS — BP 130/75 | HR 83 | Wt 183.6 lb

## 2023-02-22 DIAGNOSIS — F411 Generalized anxiety disorder: Secondary | ICD-10-CM | POA: Diagnosis not present

## 2023-02-22 MED ORDER — ESCITALOPRAM OXALATE 10 MG PO TABS: 10.0000 mg | ORAL_TABLET | Freq: Every day | ORAL | 0 refills | Status: DC

## 2023-03-14 ENCOUNTER — Other Ambulatory Visit: Payer: Self-pay | Admitting: Family Medicine

## 2023-03-14 ENCOUNTER — Encounter: Payer: Medicare Other | Admitting: Family Medicine

## 2023-03-14 MED ORDER — ESCITALOPRAM OXALATE 10 MG PO TABS: 10.0000 mg | ORAL_TABLET | Freq: Every day | ORAL | 0 refills | Status: DC

## 2023-03-14 NOTE — Telephone Encounter (Signed)
Copied from CRM [id number]. Topic: Clinical - Prescription Issue >> Mar 14, 2023 12:56 PM Alona Bene A wrote: Reason for CRM: Patient called in regarding medication refill for escitalopram (LEXAPRO) 10 MG tablet. Patient has upcoming appointment on 04/03/23 and is out of medication. Patient was advised by pharmacy that refill request was denied. Please contact patient to advise of refill.

## 2023-04-03 ENCOUNTER — Encounter: Payer: Medicare Other | Admitting: Family Medicine

## 2023-04-14 ENCOUNTER — Other Ambulatory Visit: Payer: Self-pay | Admitting: Family Medicine

## 2023-05-08 ENCOUNTER — Other Ambulatory Visit: Payer: Self-pay | Admitting: Family Medicine

## 2023-05-10 ENCOUNTER — Other Ambulatory Visit: Payer: Self-pay | Admitting: Family Medicine

## 2023-05-10 NOTE — Telephone Encounter (Signed)
 Copied from CRM [id number]. Topic: Clinical - Medication Refill >> May 10, 2023  2:55 PM Florestine Avers wrote: Most Recent Primary Care Visit:  Provider: Jeoffrey Massed  Department: [facility]  Visit Type: OFFICE VISIT  Date: 02/22/2023  Medication: escitalopram (LEXAPRO) 10 MG tablet  Has the patient contacted their pharmacy? Yes (Agent: If no, request that the patient contact the pharmacy for the refill. If patient does not wish to contact the pharmacy document the reason why and proceed with request.) (Agent: If yes, when and what did the pharmacy advise?)  Is this the correct pharmacy for this prescription? Yes If no, delete pharmacy and type the correct one.  This is the patient's preferred pharmacy:  [phone] [place], [place] - [address] [place] [address] Phone: [phone] Fax: [phone]   Has the prescription been filled recently? No  Is the patient out of the medication? Yes  Has the patient been seen for an appointment in the last year OR does the patient have an upcoming appointment? Yes  Can we respond through MyChart? Yes  Agent: Please be advised that Rx refills may take up to 3 business days. We ask that you follow-up with your pharmacy.

## 2023-06-09 ENCOUNTER — Other Ambulatory Visit: Payer: Self-pay | Admitting: Family Medicine

## 2023-06-11 ENCOUNTER — Ambulatory Visit: Payer: Self-pay

## 2023-06-11 NOTE — Telephone Encounter (Signed)
 Patient would like to know if her next refill for escitalopram  (LEXAPRO ) 10 MG tablet can be a 90 days supply. Patient currently has 13-14 pills left and the bottle she has states that there are no refill left.

## 2023-06-11 NOTE — Telephone Encounter (Signed)
 Spoke with pt, she was made aware the next refill will be 90 days

## 2023-06-13 ENCOUNTER — Other Ambulatory Visit: Payer: Self-pay | Admitting: Family Medicine

## 2023-06-13 NOTE — Telephone Encounter (Signed)
 Requesting: lorazepam  Contract: 11/23/22 UDS: 01/25/23 Last Visit: 02/22/23 Next Visit: 06/28/23 Last Refill: 01/25/23 (105,5)  Please Advise. Refill is too early

## 2023-06-14 ENCOUNTER — Other Ambulatory Visit: Payer: Self-pay | Admitting: Family Medicine

## 2023-06-14 NOTE — Telephone Encounter (Signed)
 Copied from CRM [id number]. Topic: Clinical - Medication Refill >> Jun 14, 2023  8:59 AM Howard Macho wrote: Most Recent Primary Care Visit:  Provider: Shelvia Dick  Department: [facility]  Visit Type: OFFICE VISIT  Date: 02/22/2023  Medication: LORazepam  (ATIVAN ) 2 MG tablet  Has the patient contacted their pharmacy? Yes (Agent: If no, request that the patient contact the pharmacy for the refill. If patient does not wish to contact the pharmacy document the reason why and proceed with request.) (Agent: If yes, when and what did the pharmacy advise?)  Is this the correct pharmacy for this prescription? Yes If no, delete pharmacy and type the correct one.  This is the patient's preferred pharmacy:  [facility] [phone] - [place], [place] - [address] [place] [address] Phone: [phone] Fax: [phone]   Has the prescription been filled recently? Yes  Is the patient out of the medication? Yes  Has the patient been seen for an appointment in the last year OR does the patient have an upcoming appointment? Yes  Can we respond through MyChart? Yes  Agent: Please be advised that Rx refills may take up to 3 business days. We ask that you follow-up with your pharmacy.

## 2023-06-21 ENCOUNTER — Encounter (HOSPITAL_COMMUNITY): Payer: Self-pay

## 2023-06-28 ENCOUNTER — Encounter: Payer: Self-pay | Admitting: Family Medicine

## 2023-06-28 ENCOUNTER — Ambulatory Visit (INDEPENDENT_AMBULATORY_CARE_PROVIDER_SITE_OTHER): Payer: Medicare Other | Admitting: Family Medicine

## 2023-06-28 VITALS — BP 126/76 | HR 61 | Temp 98.6°F | Ht 61.25 in | Wt 184.4 lb

## 2023-06-28 DIAGNOSIS — Z1211 Encounter for screening for malignant neoplasm of colon: Secondary | ICD-10-CM

## 2023-06-28 DIAGNOSIS — R7303 Prediabetes: Secondary | ICD-10-CM | POA: Diagnosis not present

## 2023-06-28 DIAGNOSIS — F330 Major depressive disorder, recurrent, mild: Secondary | ICD-10-CM

## 2023-06-28 DIAGNOSIS — Z0000 Encounter for general adult medical examination without abnormal findings: Secondary | ICD-10-CM | POA: Diagnosis not present

## 2023-06-28 DIAGNOSIS — F411 Generalized anxiety disorder: Secondary | ICD-10-CM | POA: Diagnosis not present

## 2023-06-28 DIAGNOSIS — E7800 Pure hypercholesterolemia, unspecified: Secondary | ICD-10-CM | POA: Diagnosis not present

## 2023-06-28 LAB — CBC WITH DIFFERENTIAL/PLATELET
Basophils Absolute: 0.1 10*3/uL (ref 0.0–0.1)
Basophils Relative: 1 % (ref 0.0–3.0)
Eosinophils Absolute: 0.2 10*3/uL (ref 0.0–0.7)
Eosinophils Relative: 4.1 % (ref 0.0–5.0)
HCT: 37.5 % (ref 36.0–46.0)
Hemoglobin: 12.1 g/dL (ref 12.0–15.0)
Lymphocytes Relative: 38.4 % (ref 12.0–46.0)
Lymphs Abs: 2.3 10*3/uL (ref 0.7–4.0)
MCHC: 32.4 g/dL (ref 30.0–36.0)
MCV: 84.1 fl (ref 78.0–100.0)
Monocytes Absolute: 0.5 10*3/uL (ref 0.1–1.0)
Monocytes Relative: 8.9 % (ref 3.0–12.0)
Neutro Abs: 2.9 10*3/uL (ref 1.4–7.7)
Neutrophils Relative %: 47.6 % (ref 43.0–77.0)
Platelets: 179 10*3/uL (ref 150.0–400.0)
RBC: 4.46 Mil/uL (ref 3.87–5.11)
RDW: 15.2 % (ref 11.5–15.5)
WBC: 6.1 10*3/uL (ref 4.0–10.5)

## 2023-06-28 LAB — COMPREHENSIVE METABOLIC PANEL WITH GFR
ALT: 18 U/L (ref 0–35)
AST: 18 U/L (ref 0–37)
Albumin: 4.4 g/dL (ref 3.5–5.2)
Alkaline Phosphatase: 106 U/L (ref 39–117)
BUN: 22 mg/dL (ref 6–23)
CO2: 29 meq/L (ref 19–32)
Calcium: 9.4 mg/dL (ref 8.4–10.5)
Chloride: 105 meq/L (ref 96–112)
Creatinine, Ser: 0.98 mg/dL (ref 0.40–1.20)
GFR: 57.21 mL/min — ABNORMAL LOW (ref >60.00)
Glucose, Bld: 100 mg/dL — ABNORMAL HIGH (ref 70–99)
Potassium: 4.6 meq/L (ref 3.5–5.1)
Sodium: 142 meq/L (ref 135–145)
Total Bilirubin: 0.3 mg/dL (ref 0.2–1.2)
Total Protein: 6.9 g/dL (ref 6.0–8.3)

## 2023-06-28 LAB — LIPID PANEL
Cholesterol: 230 mg/dL — ABNORMAL HIGH (ref 0–200)
HDL: 88.6 mg/dL (ref >39.00)
LDL Cholesterol: 125 mg/dL — ABNORMAL HIGH (ref 0–99)
NonHDL: 141.52
Total CHOL/HDL Ratio: 3
Triglycerides: 85 mg/dL (ref 0.0–149.0)
VLDL: 17 mg/dL (ref 0.0–40.0)

## 2023-06-28 LAB — HEMOGLOBIN A1C: Hgb A1c MFr Bld: 5.9 % (ref 4.6–6.5)

## 2023-06-28 MED ORDER — ESCITALOPRAM OXALATE 10 MG PO TABS
10.0000 mg | ORAL_TABLET | Freq: Every day | ORAL | 3 refills | Status: DC
Start: 2023-06-28 — End: 2023-10-24

## 2023-06-28 NOTE — Progress Notes (Signed)
 Office Note 06/28/2023  CC:  Chief Complaint  Patient presents with   Annual Exam    Pt is fasting   Patient is a 73 y.o. female who is here for annual health maintenance exam and 47-month follow-up anxiety. A/P as of last visit: "GAD. Improved with recent addition of Lexapro  5 mg a day. Will go ahead and increase Lexapro  to 10 mg a day. Continue Wellbutrin  XL 300 mg a day and lorazepam  on a scheduled basis."  INTERIM HX: Suzanne Spencer is a pretty well.  She finds the Lexapro  10 mg helpful with her Wellbutrin  XL 300 mg and her lorazepam . She is gradually coming to terms with the fact that she will likely not be reconciled with her son and his family.  She is trying to get into better eating habits and exercise habits.  PMP AWARE reviewed today: most recent rx for lorazepam  2 mg was filled 06/17/2023, # 105, rx by me. No red flags.   Past Medical History:  Diagnosis Date   Acute parotitis 11/2015   hx of:  sialolithiasis suspected by ENT--spont resolved.   Allergy    SEASONAL   Anxiety    Atrophic vaginitis    Cervical cancer screening    PAP normal 2017: GYN to repeat 2020.   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  Much improved at Dr. Shirlie Dove f/u 10/2015.   Hiatal hernia    Humerus fracture 12/19/2017   LEFT: mildly displaced comminuted fracture of the proximal left humerus.   Hyperlipidemia    LDL goal = < 140.  No meds as of 01/2021   Iron deficiency anemia Fall 2019   Celiac screen NEG, hemoccults neg.  Pt to get EGD and TCS 12/19/17.  Endoscopies cancelled b/c pt had horrible time w/prep and fell and broke humerus when trying to reach her bathroom quickly--pt declines endosc as of 06/2018.  Labs stable/normal at that time.   Medial  meniscus tear 04/2014   R knee   Osteoarthritis    KNEES   Osteoporosis    GYN managing; as of 01/2016 pt is on Evista .  She is intol of oral bisphosph due to GERD.   Other and unspecified ovarian cysts    Prediabetes 2018   A1c 6% 04/2016   Proximal humerus fracture 12/19/2017   Mildly displaced and comminuted proximal left humeral head and neck fracture.   Rib fracture 09/04/2017   Mildly displaced R 6th rib fx   Unilateral subjective nonpulsatile tinnitus without hearing loss, otoscopic finding, neurologic deficit, or head trauma     Past Surgical History:  Procedure Laterality Date   ABDOMINAL SURGERY  2003   EXPLORATORY LAPAROTOMY, [facility]SO   ADENOIDECTOMY  1954   Carotid duplex dopplers Bilateral 11/2020   <50% carotid bulb plaque bilat.   CARPAL TUNNEL RELEASE Bilateral 2001   CATARACT EXTRACTION Left 08/2022   [facility], Dr.Steven [facility]   CATARACT EXTRACTION Right 09/2022   [facility], Dr.Steven [facility]   CHOLECYSTECTOMY  1989   stones   COLONOSCOPY  2011; 03/2013   2011 NORMAL.  03/2013 TCS done for heme+ stool: no polyps.  Repeat 10 yrs (after 03/2023).   DEXA  03/23/2014; 2020  Osteopenia.  Pt took prolia  x 2 injections, then was switched to evista  by her GYN.  DEXA 01/17/16 T score a bit worse, same regimen rec'd, as of 02/2019 doing well on raloxifene , rpt DEXA planned in 1 yr. (by GYN)   ENDOMETRIAL BIOPSY  09/27/2015   PATH:  BENIGN   endoscopy upper GI  2000; 05/2014   negative 2000.  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 Sandrea Cruel is PPI bid long term.   HYSTEROSCOPY     UTERINE POLYPS   MOUTH SURGERY     OOPHORECTOMY  2003   LSO WITH EXPLORATORY LAPAROTOMY   TUBAL LIGATION  1987    Family History  Problem Relation Age of Onset   Hyperlipidemia Mother    Anxiety disorder Mother    Depression Mother    Heart attack Father        abnormal EKG; ? AVR   Diabetes Father     Heart disease Father    Irregular heart beat Sister    Osteoporosis Maternal Grandmother    Heart disease Maternal Grandfather    Heart failure Maternal Grandfather    Colon cancer Neg Hx     Social History   Socioeconomic History   Marital status: Married    Spouse name: Not on file   Number of children: 1   Years of education: Not on file   Highest education level: Not on file  Occupational History   Occupation: retired    Associate Professor: NOT EMPLOYED  Tobacco Use   Smoking status: Never   Smokeless tobacco: Never  Vaping Use   Vaping status: Never Used  Substance and Sexual Activity   Alcohol use: Yes    Alcohol/week: 1.0 standard drink of alcohol    Types: 1 Glasses of wine per week   Drug use: No   Sexual activity: Yes    Birth control/protection: Surgical    Comment: older than 16, less than 5, BTL  Other Topics Concern   Not on file  Social History Narrative   Married, one son.   Orig from [place] area, lives in [place].   Occupation: retired from [employer] systems).   No tob, rare, alcohol, no drugs.   Exercise: stationary bike 6 days a week.  [facility]   Social Drivers of Corporate investment banker Strain: Low Risk  (06/27/2022)   Overall Financial Resource Strain (CARDIA)    Difficulty of Paying Living Expenses: Not hard at all  Food Insecurity: No Food Insecurity (06/27/2022)   Hunger Vital Sign    Worried About Running Out of Food in the Last Year: Never true    Ran Out of Food in the Last Year: Never true  Transportation Needs: No Transportation Needs (06/27/2022)   PRAPARE - Administrator, Civil Service (Medical): No    Lack of Transportation (Non-Medical): No  Physical Activity: Insufficiently Active (06/27/2022)   Exercise Vital Sign    Days of Exercise per Week: 5 days    Minutes of Exercise per Session: 20 min  Stress: Stress Concern Present (06/27/2022)   Suzanne Spencer of Occupational Health -  Occupational Stress Questionnaire    Feeling of Stress : Rather much  Social Connections: Moderately Isolated (06/27/2022)   Social Connection and Isolation Panel [NHANES]    Frequency of Communication with Friends and Family: More than three times  a week    Frequency of Social Gatherings with Friends and Family: More than three times a week    Attends Religious Services: Never    Database administrator or Organizations: No    Attends Banker Meetings: Never    Marital Status: Married  Catering manager Violence: Not At Risk (06/27/2022)   Humiliation, Afraid, Rape, and Kick questionnaire    Fear of Current or Ex-Partner: No    Emotionally Abused: No    Physically Abused: No    Sexually Abused: No    Outpatient Medications Prior to Visit  Medication Sig Dispense Refill   buPROPion  (WELLBUTRIN  XL) 300 MG 24 hr tablet Take 1 tablet (300 mg total) by mouth daily. 90 tablet 3   Calcium Carbonate-Vitamin D  600-400 MG-UNIT per tablet Take 1 tablet by mouth daily.     Cyanocobalamin (VITAMIN B-12 PO) Take by mouth.     LORazepam  (ATIVAN ) 2 MG tablet TAKE 1 TABLET BY MOUTH 3 TIMES A DAY AS NEEDED FOR ANXIETY.  May take an additional tab every 24 hours as needed for extreme anxiety. 105 tablet 5   Magnesium 500 MG CAPS Take 1 capsule by mouth daily.     Multiple Vitamin (MULTIVITAMIN) tablet Take 1 tablet by mouth daily.     Multiple Vitamins-Minerals (ZINC PO) Take by mouth.     omeprazole  (PRILOSEC) 40 MG capsule TAKE 1 CAPSULE BY MOUTH TWICE A DAY 180 capsule 1   raloxifene  (EVISTA ) 60 MG tablet TAKE 1 TABLET BY MOUTH EVERY DAY 90 tablet 4   escitalopram  (LEXAPRO ) 10 MG tablet TAKE 1 TABLET BY MOUTH EVERY DAY 30 tablet 1   No facility-administered medications prior to visit.    No Known Allergies  Review of Systems  Constitutional:  Negative for appetite change, chills, fatigue and fever.  HENT:  Negative for congestion, dental problem, ear pain and sore throat.   Eyes:   Negative for discharge, redness and visual disturbance.  Respiratory:  Negative for cough, chest tightness, shortness of breath and wheezing.   Cardiovascular:  Negative for chest pain, palpitations and leg swelling.  Gastrointestinal:  Negative for abdominal pain, blood in stool, diarrhea, nausea and vomiting.  Genitourinary:  Negative for difficulty urinating, dysuria, flank pain, frequency, hematuria and urgency.  Musculoskeletal:  Negative for arthralgias, back pain, joint swelling, myalgias and neck stiffness.  Skin:  Negative for pallor and rash.  Neurological:  Negative for dizziness, speech difficulty, weakness and headaches.  Hematological:  Negative for adenopathy. Does not bruise/bleed easily.  Psychiatric/Behavioral:  Negative for confusion and sleep disturbance. The patient is not nervous/anxious.     PE;    06/28/2023    8:04 AM 02/22/2023   10:39 AM 01/25/2023    9:59 AM  Vitals with BMI  Height 5' 1.25"    Weight 184 lbs 6 oz 183 lbs 10 oz 183 lbs 10 oz  BMI 34.55    Systolic 126 130 161  Diastolic 76 75 81  Pulse 61 83 94    Gen: Alert, well appearing.  Patient is oriented to person, place, time, and situation. AFFECT: pleasant, lucid thought and speech. ENT: Ears: EACs clear, normal epithelium.  TMs with good light reflex and landmarks bilaterally.  Eyes: no injection, icteris, swelling, or exudate.  EOMI, PERRLA. Nose: no drainage or turbinate edema/swelling.  No injection or focal lesion.  Mouth: lips without lesion/swelling.  Oral mucosa pink and moist.  Dentition intact and without obvious caries or gingival swelling.  Oropharynx without erythema, exudate, or swelling.  Neck: supple/nontender.  No LAD, mass, or TM.  Carotid pulses 2+ bilaterally, without bruits. CV: RRR, no m/r/g.   LUNGS: CTA bilat, nonlabored resps, good aeration in all lung fields. ABD: soft, NT, ND, BS normal.  No hepatospenomegaly or mass.  No bruits. EXT: no clubbing, cyanosis, or edema.   Musculoskeletal: no joint swelling, erythema, warmth, or tenderness.  ROM of all joints intact. Skin - no sores or suspicious lesions or rashes or color changes  Pertinent labs:  Lab Results  Component Value Date   TSH 2.08 02/10/2021   Lab Results  Component Value Date   WBC 5.8 03/12/2022   HGB 12.5 03/12/2022   HCT 37.8 03/12/2022   MCV 87.4 03/12/2022   PLT 152.0 03/12/2022   Lab Results  Component Value Date   CREATININE 0.84 03/12/2022   BUN 21 03/12/2022   NA 142 03/12/2022   K 4.2 03/12/2022   CL 105 03/12/2022   CO2 28 03/12/2022   Lab Results  Component Value Date   ALT 19 03/12/2022   AST 39 (H) 03/12/2022   ALKPHOS 92 03/12/2022   BILITOT 0.4 03/12/2022   Lab Results  Component Value Date   CHOL 211 (H) 03/12/2022   Lab Results  Component Value Date   HDL 82.30 03/12/2022   Lab Results  Component Value Date   LDLCALC 112 (H) 03/12/2022   Lab Results  Component Value Date   TRIG 82.0 03/12/2022   Lab Results  Component Value Date   CHOLHDL 3 03/12/2022   Lab Results  Component Value Date   HGBA1C 5.9 03/12/2022   ASSESSMENT AND PLAN:   #1 health maintenance exam: Reviewed age and gender appropriate health maintenance issues (prudent diet, regular exercise, health risks of tobacco and excessive alcohol, use of seatbelts, fire alarms in home, use of sunscreen).  Also reviewed age and gender appropriate health screening as well as vaccine recommendations. Vaccines:  ALL UTD. Labs: fasting HP + Hba1c (borderline HLD + prediabetes). Cervical ca screening: hx of cerv dysplasia, is s/p LEEP, GYN exam/pap with GYN MD. Breast ca screening: mammogram 10/14/2021 normal via GYN ([facility]). Colon ca screening: recall as of Feb this year.  --cologuard ordered today. Osteoporosis: managed by her GYN MD, pt intol of bisphosphonates-->evista , vit D, Ca++->DEXAs per GYN.  #2 recurrent MDD, GAD.  Stable. Continue Lexapro  at the 10 mg daily dose. Continue  Wellbutrin  XL 300 mg a day and lorazepam  on a scheduled basis.  An After Visit Summary was printed and given to the patient.  FOLLOW UP:  Return in about 3 months (around 09/28/2023) for routine chronic illness f/u.  Signed:  Arletha Lady, MD           06/28/2023

## 2023-06-28 NOTE — Patient Instructions (Signed)

## 2023-07-01 ENCOUNTER — Ambulatory Visit: Payer: Self-pay | Admitting: Family Medicine

## 2023-07-18 ENCOUNTER — Other Ambulatory Visit: Payer: Self-pay | Admitting: Family Medicine

## 2023-07-25 LAB — COLOGUARD: COLOGUARD: NEGATIVE

## 2023-07-26 ENCOUNTER — Encounter: Payer: Self-pay | Admitting: Family Medicine

## 2023-08-11 ENCOUNTER — Other Ambulatory Visit: Payer: Self-pay | Admitting: Family Medicine

## 2023-08-14 ENCOUNTER — Ambulatory Visit: Admitting: *Deleted

## 2023-08-14 VITALS — Ht 61.0 in | Wt 184.0 lb

## 2023-08-14 DIAGNOSIS — Z0000 Encounter for general adult medical examination without abnormal findings: Secondary | ICD-10-CM | POA: Diagnosis not present

## 2023-08-14 NOTE — Progress Notes (Signed)
 Subjective:   Suzanne Spencer is a 73 y.o. female who presents for Medicare Annual (Subsequent) preventive examination.  Visit Complete: Virtual I connected with  [facility] on 08/14/23 by a audio enabled telemedicine application and verified that I am speaking with the correct person using two identifiers.  Patient Location: Home  Provider Location: Home Office  I discussed the limitations of evaluation and management by telemedicine. The patient expressed understanding and agreed to proceed.  Vital Signs: Because this visit was a virtual/telehealth visit, some criteria may be missing or patient reported. Any vitals not documented were not able to be obtained and vitals that have been documented are patient reported.   Cardiac Risk Factors include: advanced age (>49men, >36 women);obesity (BMI >30kg/m2)     Objective:    Today's Vitals   08/14/23 1518  Weight: 184 lb (83.5 kg)  Height: 5' 1 (1.549 m)   Body mass index is 34.77 kg/m.     08/14/2023    3:17 PM 06/27/2022   11:35 AM 05/18/2020   10:33 AM 10/16/2016    9:12 AM  Advanced Directives  Does Patient Have a Medical Advance Directive? Yes Yes Yes Yes   Type of Estate agent of [employer] Power of [employer];Living will Healthcare Power of Attorney Living will;Healthcare Power of Attorney  Does patient want to make changes to medical advance directive?  No - Patient declined    Copy of Healthcare Power of Attorney in Chart? No - copy requested Yes - validated most recent copy scanned in chart (See row information) Yes - validated most recent copy scanned in chart (See row information) No - copy requested      Data saved with a previous flowsheet row definition    Current Medications (verified) Outpatient Encounter Medications as of 08/14/2023  Medication Sig   buPROPion  (WELLBUTRIN  XL) 300 MG 24 hr tablet Take 1 tablet (300 mg total) by mouth daily.   Calcium Carbonate-Vitamin D  600-400  MG-UNIT per tablet Take 1 tablet by mouth daily.   Cyanocobalamin (VITAMIN B-12 PO) Take by mouth.   escitalopram  (LEXAPRO ) 10 MG tablet Take 1 tablet (10 mg total) by mouth daily.   LORazepam  (ATIVAN ) 2 MG tablet TAKE 1 TABLET BY MOUTH 3 TIMES A DAY AS NEEDED FOR ANXIETY.  May take an additional tab every 24 hours as needed for extreme anxiety.   Magnesium 500 MG CAPS Take 1 capsule by mouth daily.   Multiple Vitamin (MULTIVITAMIN) tablet Take 1 tablet by mouth daily.   Multiple Vitamins-Minerals (ZINC PO) Take by mouth.   omeprazole  (PRILOSEC) 40 MG capsule TAKE 1 CAPSULE BY MOUTH TWICE A DAY   raloxifene  (EVISTA ) 60 MG tablet TAKE 1 TABLET BY MOUTH EVERY DAY   No facility-administered encounter medications on file as of 08/14/2023.    Allergies (verified) Patient has no known allergies.   History: Past Medical History:  Diagnosis Date   Acute parotitis 11/2015   hx of:  sialolithiasis suspected by ENT--spont resolved.   Allergy    SEASONAL   Anxiety    Atrophic vaginitis    Cervical cancer screening    PAP normal 2017: GYN to repeat 2020.   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  Much improved at Dr. Theressa f/u 10/2015.   Hiatal hernia    Humerus fracture 12/19/2017   LEFT: mildly displaced comminuted fracture of the proximal left humerus.   Hyperlipidemia    LDL goal = < 140.  No meds as of 01/2021   Iron deficiency anemia Fall 2019   Celiac screen NEG, hemoccults neg.  Pt to get EGD and TCS 12/19/17.  Endoscopies cancelled b/c pt had horrible time w/prep and fell and broke humerus when trying to reach her bathroom quickly--pt declines endosc as of 06/2018.  Labs stable/normal at that  time.   Medial meniscus tear 04/2014   R knee   Osteoarthritis    KNEES   Osteoporosis    GYN managing; as of 01/2016 pt is on Evista .  She is intol of oral bisphosph due to GERD.   Other and unspecified ovarian cysts    Prediabetes 2018   A1c 6% 04/2016   Proximal humerus fracture 12/19/2017   Mildly displaced and comminuted proximal left humeral head and neck fracture.   Rib fracture 09/04/2017   Mildly displaced R 6th rib fx   Unilateral subjective nonpulsatile tinnitus without hearing loss, otoscopic finding, neurologic deficit, or head trauma    Past Surgical History:  Procedure Laterality Date   ABDOMINAL SURGERY  2003   EXPLORATORY LAPAROTOMY, LSO   ADENOIDECTOMY  1954   Carotid duplex dopplers Bilateral 11/2020   <50% carotid bulb plaque bilat.   CARPAL TUNNEL RELEASE Bilateral 2001   CATARACT EXTRACTION Left 08/2022   [facility], Dr.Steven [facility]   CATARACT EXTRACTION Right 09/2022   [facility], Dr.Steven [facility]   CHOLECYSTECTOMY  1989   stones   COLONOSCOPY  2011; 03/2013   2011 NORMAL.  03/2013 TCS done for heme+ stool: no polyps.  Repeat 10 yrs (after 03/2023).   DEXA  03/23/2014; 2020   Osteopenia.  Pt took prolia  x 2 injections, then was switched to evista  by her GYN.  DEXA 01/17/16 T score a bit worse, same regimen rec'd, as of 02/2019 doing well on raloxifene , rpt DEXA planned in 1 yr. (by GYN)   ENDOMETRIAL BIOPSY  09/27/2015   PATH:  BENIGN   endoscopy upper GI  2000; 05/2014   negative 2000.  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 Aneita is PPI bid long term.   HYSTEROSCOPY     UTERINE POLYPS   MOUTH SURGERY     OOPHORECTOMY  2003   LSO WITH EXPLORATORY LAPAROTOMY   TUBAL LIGATION  1987   Family History  Problem Relation Age of Onset   Hyperlipidemia Mother    Anxiety disorder Mother    Depression Mother    Heart attack Father        abnormal EKG; ? AVR    Diabetes Father    Heart disease Father    Irregular heart beat Sister    Osteoporosis Maternal Grandmother    Heart disease Maternal Grandfather    Heart failure Maternal Grandfather    Colon cancer Neg Hx    Social History   Socioeconomic History   Marital status: Married    Spouse name: Not on file   Number of children: 1   Years of education: Not on file   Highest education level: Not on file  Occupational History  Occupation: retired    Associate Professor: NOT EMPLOYED  Tobacco Use   Smoking status: Never   Smokeless tobacco: Never  Vaping Use   Vaping status: Never Used  Substance and Sexual Activity   Alcohol use: Yes    Alcohol/week: 1.0 standard drink of alcohol    Types: 1 Glasses of wine per week   Drug use: No   Sexual activity: Yes    Birth control/protection: Surgical    Comment: older than 16, less than 5, BTL  Other Topics Concern   Not on file  Social History Narrative   Married, one son.   Orig from [place] area, lives in [place].   Occupation: retired from [employer], systems).   No tob, rare, alcohol, no drugs.   Exercise: stationary bike 6 days a week.  [facility]   Social Drivers of Corporate investment banker Strain: Low Risk  (08/14/2023)   Overall Financial Resource Strain (CARDIA)    Difficulty of Paying Living Expenses: Not hard at all  Food Insecurity: No Food Insecurity (08/14/2023)   Hunger Vital Sign    Worried About Running Out of Food in the Last Year: Never true    Ran Out of Food in the Last Year: Never true  Transportation Needs: No Transportation Needs (08/14/2023)   PRAPARE - Administrator, Civil Service (Medical): No    Lack of Transportation (Non-Medical): No  Physical Activity: Insufficiently Active (08/14/2023)   Exercise Vital Sign    Days of Exercise per Week: 2 days    Minutes of Exercise per Session: 30 min  Stress: Stress Concern Present (08/14/2023)   [employer] of Occupational  Health - Occupational Stress Questionnaire    Feeling of Stress: Rather much  Social Connections: Unknown (08/14/2023)   Social Connection and Isolation Panel    Frequency of Communication with Friends and Family: More than three times a week    Frequency of Social Gatherings with Friends and Family: Twice a week    Attends Religious Services: Not on Marketing executive or Organizations: No    Attends Banker Meetings: Never    Marital Status: Married    Tobacco Counseling Counseling given: Not Answered   Clinical Intake:                        Activities of Daily Living    08/14/2023    3:18 PM  In your present state of health, do you have any difficulty performing the following activities:  Hearing? 0  Vision? 0  Difficulty concentrating or making decisions? 0  Walking or climbing stairs? 0  Dressing or bathing? 0  Doing errands, shopping? 0  Preparing Food and eating ? N  Using the Toilet? N  In the past six months, have you accidently leaked urine? N  Do you have problems with loss of bowel control? N  Managing your Medications? N  Managing your Finances? N  Housekeeping or managing your Housekeeping? N    Patient Care Team: Candise Aleene DEL, MD as PCP - General (Family Medicine) Claudene Arthea HERO, DO as Consulting Physician (Sports Medicine) Aneita Gwendlyn DASEN, MD (Inactive) as Consulting Physician (Gastroenterology) Roark Rush, MD as Consulting Physician (Otolaryngology) Vincente Grip, MD as Consulting Physician (Psychiatry) Lavoie, Marie-Lyne, MD as Consulting Physician (Obstetrics and Gynecology) [facility] (Dentistry) Dermatology, Ruthellen Evans, Venetia BRAVO, MD as Consulting Physician (  Sports Medicine)  Indicate any recent Medical Services you may have received from other than [facility] providers in the past year (date may be approximate).     Assessment:   This is a routine wellness examination for  Naly.  Hearing/Vision screen Hearing Screening - Comments:: No trouble hearing Vision Screening - Comments:: [facility] Up to date   Goals Addressed             This Visit's Progress    Patient Stated   On track    Drink more water     Patient Stated   Not on track    Lose weight      Weight (lb) < 200 lb (90.7 kg)   184 lb (83.5 kg)      Depression Screen    08/14/2023    3:20 PM 06/28/2023    8:03 AM 01/25/2023   10:06 AM 11/23/2022   10:25 AM 09/07/2022   10:50 AM 08/29/2022   11:06 AM 08/23/2022   10:19 AM  PHQ 2/9 Scores  PHQ - 2 Score 2 2 2 2 1 1 2   PHQ- 9 Score 2 4 5 4 3 2 4     Fall Risk    08/14/2023    3:15 PM 01/25/2023   10:06 AM 11/23/2022   10:28 AM 09/07/2022   10:50 AM 08/29/2022   11:06 AM  Fall Risk   Falls in the past year? 0 0 0 0 0  Number falls in past yr: 0  0    Injury with Fall? 0  0    Risk for fall due to :   No Fall Risks No Fall Risks No Fall Risks  Follow up Falls evaluation completed;Education provided;Falls prevention discussed Falls evaluation completed Falls evaluation completed Falls evaluation completed Falls evaluation completed    MEDICARE RISK AT HOME: Medicare Risk at Home Any stairs in or around the home?: Yes If so, are there any without handrails?: No Home free of loose throw rugs in walkways, pet beds, electrical cords, etc?: Yes Adequate lighting in your home to reduce risk of falls?: Yes Life alert?: No Use of a cane, walker or w/c?: No Grab bars in the bathroom?: Yes Shower chair or bench in shower?: No Elevated toilet seat or a handicapped toilet?: Yes  TIMED UP AND GO:  Was the test performed?  No    Cognitive Function:        08/14/2023    3:17 PM  6CIT Screen  What Year? 0 points  What month? 0 points  What time? 0 points  Count back from 20 0 points  Months in reverse 0 points  Repeat phrase 0 points  Total Score 0 points    Immunizations Immunization History  Administered  Date(s) Administered   Influenza Split 11/21/2010, 11/20/2011   Influenza Whole 11/26/2007, 11/18/2008, 11/08/2009   Influenza, High Dose Seasonal PF 10/16/2016, 10/11/2017, 10/18/2018, 09/30/2019   Influenza,inj,Quad PF,6+ Mos 11/25/2012, 10/21/2013, 10/26/2014, 11/01/2015   Pneumococcal Conjugate-13 10/13/2015   Pneumococcal Polysaccharide-23 10/16/2016   Tdap 05/16/2010, 02/14/2021   Zoster Recombinant(Shingrix) 08/04/2017, 09/29/2017   Zoster, Live 11/15/2009    TDAP status: Up to date  Flu Vaccine status: Up to date  Pneumococcal vaccine status: Up to date  Covid-19 vaccine status: Information provided on how to obtain vaccines.   Qualifies for Shingles Vaccine? No   Zostavax completed Yes   Shingrix Completed?: Yes  Screening Tests Health Maintenance  Topic Date Due   COVID-19 Vaccine (  1) Never done   INFLUENZA VACCINE  09/13/2023   MAMMOGRAM  11/15/2023   Medicare Annual Wellness (AWV)  08/13/2024   Fecal DNA (Cologuard)  07/18/2026   DTaP/Tdap/Td (3 - Td or Tdap) 02/15/2031   Pneumococcal Vaccine: 50+ Years  Completed   DEXA SCAN  Completed   Hepatitis C Screening  Completed   Zoster Vaccines- Shingrix  Completed   Hepatitis B Vaccines  Aged Out   HPV VACCINES  Aged Out   Meningococcal B Vaccine  Aged Out   Colonoscopy  Discontinued    Health Maintenance  Health Maintenance Due  Topic Date Due   COVID-19 Vaccine (1) Never done    Colorectal cancer screening: Type of screening: Cologuard. Completed 2025. Repeat every 3 years  Mammogram status: Completed  . Repeat every year  Bone Density  Education provided  Lung Cancer Screening: (Low Dose CT Chest recommended if Age 34-80 years, 20 pack-year currently smoking OR have quit w/in 15years.) does not qualify.   Lung Cancer Screening Referral:   Additional Screening:  Hepatitis C Screening: does not qualify; Completed   Vision Screening: Recommended annual ophthalmology exams for early detection of  glaucoma and other disorders of the eye. Is the patient up to date with their annual eye exam?  Yes  Who is the provider or what is the name of the office in which the patient attends annual eye exams? [facility] If pt is not established with a provider, would they like to be referred to a provider to establish care? No .   Dental Screening: Recommended annual dental exams for proper oral hygiene    Community Resource Referral / Chronic Care Management: CRR required this visit?  No   CCM required this visit?  No     Plan:     I have personally reviewed and noted the following in the patient's chart:   Medical and social history Use of alcohol, tobacco or illicit drugs  Current medications and supplements including opioid prescriptions. Patient is not currently taking opioid prescriptions. Functional ability and status Nutritional status Physical activity Advanced directives List of other physicians Hospitalizations, surgeries, and ER visits in previous 12 months Vitals Screenings to include cognitive, depression, and falls Referrals and appointments  In addition, I have reviewed and discussed with patient certain preventive protocols, quality metrics, and best practice recommendations. A written personalized care plan for preventive services as well as general preventive health recommendations were provided to patient.     Mliss Graff, LPN   03/21/7972   After Visit Summary: (MyChart) Due to this being a telephonic visit, the after visit summary with patients personalized plan was offered to patient via MyChart   Nurse Notes:

## 2023-08-14 NOTE — Patient Instructions (Signed)
 Suzanne Spencer , Thank you for taking time to come for your Medicare Wellness Visit. I appreciate your ongoing commitment to your health goals. Please review the following plan we discussed and let me know if I can assist you in the future.   Screening recommendations/referrals: Colonoscopy: up to date Mammogram: up to date Bone Density: Education provided Recommended yearly ophthalmology/optometry visit for glaucoma screening and checkup Recommended yearly dental visit for hygiene and checkup  Vaccinations: Influenza vaccine: up to date Pneumococcal vaccine: up to date Tdap vaccine: up to date Shingles vaccine: up to date      Preventive Care 65 Years and Older, Female Preventive care refers to lifestyle choices and visits with your health care provider that can promote health and wellness. What does preventive care include? A yearly physical exam. This is also called an annual well check. Dental exams once or twice a year. Routine eye exams. Ask your health care provider how often you should have your eyes checked. Personal lifestyle choices, including: Daily care of your teeth and gums. Regular physical activity. Eating a healthy diet. Avoiding tobacco and drug use. Limiting alcohol use. Practicing safe sex. Taking low-dose aspirin every day. Taking vitamin and mineral supplements as recommended by your health care provider. What happens during an annual well check? The services and screenings done by your health care provider during your annual well check will depend on your age, overall health, lifestyle risk factors, and family history of disease. Counseling  Your health care provider may ask you questions about your: Alcohol use. Tobacco use. Drug use. Emotional well-being. Home and relationship well-being. Sexual activity. Eating habits. History of falls. Memory and ability to understand (cognition). Work and work Astronomer. Reproductive health. Screening  You  may have the following tests or measurements: Height, weight, and BMI. Blood pressure. Lipid and cholesterol levels. These may be checked every 5 years, or more frequently if you are over 50 years old. Skin check. Lung cancer screening. You may have this screening every year starting at age 109 if you have a 30-pack-year history of smoking and currently smoke or have quit within the past 15 years. Fecal occult blood test (FOBT) of the stool. You may have this test every year starting at age 61. Flexible sigmoidoscopy or colonoscopy. You may have a sigmoidoscopy every 5 years or a colonoscopy every 10 years starting at age 50. Hepatitis C blood test. Hepatitis B blood test. Sexually transmitted disease (STD) testing. Diabetes screening. This is done by checking your blood sugar (glucose) after you have not eaten for a while (fasting). You may have this done every 1-3 years. Bone density scan. This is done to screen for osteoporosis. You may have this done starting at age 68. Mammogram. This may be done every 1-2 years. Talk to your health care provider about how often you should have regular mammograms. Talk with your health care provider about your test results, treatment options, and if necessary, the need for more tests. Vaccines  Your health care provider may recommend certain vaccines, such as: Influenza vaccine. This is recommended every year. Tetanus, diphtheria, and acellular pertussis (Tdap, Td) vaccine. You may need a Td booster every 10 years. Zoster vaccine. You may need this after age 29. Pneumococcal 13-valent conjugate (PCV13) vaccine. One dose is recommended after age 38. Pneumococcal polysaccharide (PPSV23) vaccine. One dose is recommended after age 59. Talk to your health care provider about which screenings and vaccines you need and how often you need them. This information  is not intended to replace advice given to you by your health care provider. Make sure you discuss any  questions you have with your health care provider. Document Released: 02/25/2015 Document Revised: 10/19/2015 Document Reviewed: 11/30/2014 Elsevier Interactive Patient Education  2017 [employer].  Fall Prevention in the Home Falls can cause injuries. They can happen to people of all ages. There are many things you can do to make your home safe and to help prevent falls. What can I do on the outside of my home? Regularly fix the edges of walkways and driveways and fix any cracks. Remove anything that might make you trip as you walk through a door, such as a raised step or threshold. Trim any bushes or trees on the path to your home. Use bright outdoor lighting. Clear any walking paths of anything that might make someone trip, such as rocks or tools. Regularly check to see if handrails are loose or broken. Make sure that both sides of any steps have handrails. Any raised decks and porches should have guardrails on the edges. Have any leaves, snow, or ice cleared regularly. Use sand or salt on walking paths during winter. Clean up any spills in your garage right away. This includes oil or grease spills. What can I do in the bathroom? Use night lights. Install grab bars by the toilet and in the tub and shower. Do not use towel bars as grab bars. Use non-skid mats or decals in the tub or shower. If you need to sit down in the shower, use a plastic, non-slip stool. Keep the floor dry. Clean up any water that spills on the floor as soon as it happens. Remove soap buildup in the tub or shower regularly. Attach bath mats securely with double-sided non-slip rug tape. Do not have throw rugs and other things on the floor that can make you trip. What can I do in the bedroom? Use night lights. Make sure that you have a light by your bed that is easy to reach. Do not use any sheets or blankets that are too big for your bed. They should not hang down onto the floor. Have a firm chair that has side  arms. You can use this for support while you get dressed. Do not have throw rugs and other things on the floor that can make you trip. What can I do in the kitchen? Clean up any spills right away. Avoid walking on wet floors. Keep items that you use a lot in easy-to-reach places. If you need to reach something above you, use a strong step stool that has a grab bar. Keep electrical cords out of the way. Do not use floor polish or wax that makes floors slippery. If you must use wax, use non-skid floor wax. Do not have throw rugs and other things on the floor that can make you trip. What can I do with my stairs? Do not leave any items on the stairs. Make sure that there are handrails on both sides of the stairs and use them. Fix handrails that are broken or loose. Make sure that handrails are as long as the stairways. Check any carpeting to make sure that it is firmly attached to the stairs. Fix any carpet that is loose or worn. Avoid having throw rugs at the top or bottom of the stairs. If you do have throw rugs, attach them to the floor with carpet tape. Make sure that you have a light switch at the top of  the stairs and the bottom of the stairs. If you do not have them, ask someone to add them for you. What else can I do to help prevent falls? Wear shoes that: Do not have high heels. Have rubber bottoms. Are comfortable and fit you well. Are closed at the toe. Do not wear sandals. If you use a stepladder: Make sure that it is fully opened. Do not climb a closed stepladder. Make sure that both sides of the stepladder are locked into place. Ask someone to hold it for you, if possible. Clearly mark and make sure that you can see: Any grab bars or handrails. First and last steps. Where the edge of each step is. Use tools that help you move around (mobility aids) if they are needed. These include: Canes. Walkers. Scooters. Crutches. Turn on the lights when you go into a dark area.  Replace any light bulbs as soon as they burn out. Set up your furniture so you have a clear path. Avoid moving your furniture around. If any of your floors are uneven, fix them. If there are any pets around you, be aware of where they are. Review your medicines with your doctor. Some medicines can make you feel dizzy. This can increase your chance of falling. Ask your doctor what other things that you can do to help prevent falls. This information is not intended to replace advice given to you by your health care provider. Make sure you discuss any questions you have with your health care provider. Document Released: 11/25/2008 Document Revised: 07/07/2015 Document Reviewed: 03/05/2014 Elsevier Interactive Patient Education  2017 [employer].

## 2023-09-15 ENCOUNTER — Other Ambulatory Visit: Payer: Self-pay | Admitting: Family Medicine

## 2023-09-28 ENCOUNTER — Other Ambulatory Visit: Payer: Self-pay | Admitting: Family Medicine

## 2023-10-02 ENCOUNTER — Encounter: Payer: Self-pay | Admitting: Family Medicine

## 2023-10-02 ENCOUNTER — Telehealth: Payer: Self-pay

## 2023-10-02 ENCOUNTER — Ambulatory Visit: Admitting: Family Medicine

## 2023-10-02 VITALS — BP 115/68 | HR 82 | Temp 98.0°F | Ht 61.0 in | Wt 185.2 lb

## 2023-10-02 DIAGNOSIS — F411 Generalized anxiety disorder: Secondary | ICD-10-CM

## 2023-10-02 DIAGNOSIS — F3341 Major depressive disorder, recurrent, in partial remission: Secondary | ICD-10-CM | POA: Diagnosis not present

## 2023-10-02 DIAGNOSIS — T3995XA Adverse effect of unspecified nonopioid analgesic, antipyretic and antirheumatic, initial encounter: Secondary | ICD-10-CM | POA: Diagnosis not present

## 2023-10-02 DIAGNOSIS — G4440 Drug-induced headache, not elsewhere classified, not intractable: Secondary | ICD-10-CM | POA: Diagnosis not present

## 2023-10-02 DIAGNOSIS — R519 Headache, unspecified: Secondary | ICD-10-CM

## 2023-10-02 MED ORDER — RIZATRIPTAN BENZOATE 10 MG PO TABS: 10.0000 mg | ORAL_TABLET | ORAL | 0 refills | Status: DC | PRN

## 2023-10-02 NOTE — Telephone Encounter (Signed)
 Copied from CRM #[id number]. Topic: General - Other >> Oct 02, 2023 10:43 AM Suzanne Spencer wrote: Reason for CRM: patient called stating the pharmacy stated she  need a prior authorization for rizatriptan  because of the high dosage CB [phone]

## 2023-10-02 NOTE — Progress Notes (Signed)
 OFFICE VISIT  10/02/2023  CC:  Chief Complaint  Patient presents with   Medical Management of Chronic Issues    Patient is a 73 y.o. female who presents for 38-month follow-up recurrent major depressive disorder and GAD. A/P as of last visit:  Recurrent MDD, GAD.  Stable. Continue Lexapro  at the 10 mg daily dose. Continue Wellbutrin  XL 300 mg a day and lorazepam  on a scheduled basis.  INTERIM HX: All labs excellent last visit. Overall from an anxiety and mood standpoint she is stable.  She reports having headaches every day.  She has been taking over-the-counter analgesics at least daily, sometimes multiple times a day.  These give her short-term relief, sometimes do not help at all. She denies any nausea, photophobia, phonophobia, or dizziness with her headaches.  They are usually located on the forehead region and occasionally more of a right periorbital region.  Occasional focal sharp pain in the left occipital area. She thinks chronic stress is the trigger.  No other triggers identified.  Review of systems: No fevers, no visual abnormalities, no hearing abnormalities, no balance problems, no tremor.   PMP AWARE reviewed today: most recent rx for lorazepam  2 mg was filled 09/20/2023, # 105, rx by me.   No red flags.   Past Medical History:  Diagnosis Date   Acute parotitis 11/2015   hx of:  sialolithiasis suspected by ENT--spont resolved.   Allergy    SEASONAL   Anxiety    Atrophic vaginitis    Cervical cancer screening    PAP normal 2017: GYN to repeat 2020.   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  Much improved at Dr. Theressa f/u 10/2015.   Hiatal hernia    Humerus fracture  12/19/2017   LEFT: mildly displaced comminuted fracture of the proximal left humerus.   Hyperlipidemia    LDL goal = < 140.  No meds as of 01/2021   Iron deficiency anemia Fall 2019   Celiac screen NEG, hemoccults neg.  Pt to get EGD and TCS 12/19/17.  Endoscopies cancelled b/c pt had horrible time w/prep and fell and broke humerus when trying to reach her bathroom quickly--pt declines endosc as of 06/2018.  Labs stable/normal at that time.   Medial meniscus tear 04/2014   R knee   Osteoarthritis    KNEES   Osteoporosis    GYN managing; as of 01/2016 pt is on Evista .  She is intol of oral bisphosph due to GERD.   Other and unspecified ovarian cysts    Prediabetes 2018   A1c 6% 04/2016   Proximal humerus fracture 12/19/2017   Mildly displaced and comminuted proximal left humeral head and neck fracture.   Rib fracture 09/04/2017   Mildly displaced R 6th rib fx   Unilateral subjective nonpulsatile tinnitus without hearing loss, otoscopic finding, neurologic deficit, or head trauma     Past Surgical History:  Procedure Laterality Date   ABDOMINAL SURGERY  2003   EXPLORATORY LAPAROTOMY, LSO   ADENOIDECTOMY  1954   Carotid duplex dopplers Bilateral 11/2020   <50% carotid bulb plaque bilat.   CARPAL TUNNEL RELEASE Bilateral 2001  CATARACT EXTRACTION Left 08/2022   [facility], Dr[facility]Steven [facility]   CATARACT EXTRACTION Right 09/2022   [facility], Dr.Steven [facility]   CHOLECYSTECTOMY  1989   stones   COLONOSCOPY  2011; 03/2013   2011 NORMAL.  03/2013 TCS done for heme+ stool: no polyps.  Repeat 10 yrs (after 03/2023).   DEXA  03/23/2014; 2020   Osteopenia.  Pt took prolia  x 2 injections, then was switched to evista  by her GYN.  DEXA 01/17/16 T score a bit worse, same regimen rec'd, as of 02/2019 doing well on raloxifene , rpt DEXA planned in 1 yr. (by GYN)   ENDOMETRIAL BIOPSY  09/27/2015   PATH:  BENIGN   endoscopy upper GI  2000; 05/2014   negative 2000.  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 Aneita is PPI bid long term.   HYSTEROSCOPY     UTERINE POLYPS   MOUTH SURGERY     OOPHORECTOMY  2003   LSO WITH EXPLORATORY LAPAROTOMY   TUBAL LIGATION  1987    Outpatient Medications Prior to Visit  Medication Sig Dispense Refill   buPROPion  (WELLBUTRIN  XL) 300 MG 24 hr tablet TAKE 1 TABLET BY MOUTH EVERY DAY 90 tablet 0   Calcium Carbonate-Vitamin D  600-400 MG-UNIT per tablet Take 1 tablet by mouth daily.     Cyanocobalamin (VITAMIN B-12 PO) Take by mouth.     escitalopram  (LEXAPRO ) 10 MG tablet Take 1 tablet (10 mg total) by mouth daily. 90 tablet 3   LORazepam  (ATIVAN ) 2 MG tablet TAKE 1 TABLET BY MOUTH 3 TIMES A DAY AS NEEDED FOR ANXIETY.  May take an additional tab every 24 hours as needed for extreme anxiety. 105 tablet 5   Magnesium 500 MG CAPS Take 1 capsule by mouth daily.     Multiple Vitamin (MULTIVITAMIN) tablet Take 1 tablet by mouth daily.     Multiple Vitamins-Minerals (ZINC PO) Take by mouth.     raloxifene  (EVISTA ) 60 MG tablet TAKE 1 TABLET BY MOUTH EVERY DAY 90 tablet 4   omeprazole  (PRILOSEC) 40 MG capsule TAKE 1 CAPSULE BY MOUTH TWICE A DAY (Patient not taking: Reported on 10/02/2023) 60 capsule 1   No facility-administered medications prior to visit.    No Known Allergies  Review of Systems As per HPI  PE:    10/02/2023    9:05 AM 08/14/2023    3:18 PM 06/28/2023    8:04 AM  Vitals with BMI  Height 5' 1 5' 1 5' 1.25  Weight 185 lbs 3 oz 184 lbs 184 lbs 6 oz  BMI 35.01 34.78 34.55  Systolic 115  126  Diastolic 68  76  Pulse 82  61     Physical Exam  General: Alert and well-appearing. Affect is pleasant, speech and thought are lucid. No further exam today  LABS:  Last CBC Lab Results  Component Value Date   WBC 6.1 06/28/2023   HGB 12.1 06/28/2023   HCT 37.5 06/28/2023   MCV 84.1 06/28/2023   MCH 28.9 06/27/2018   RDW 15.2 06/28/2023   PLT  179.0 06/28/2023   Last metabolic panel Lab Results  Component Value Date   GLUCOSE 100 (H) 06/28/2023   NA 142 06/28/2023   K 4.6 06/28/2023   CL 105 06/28/2023   CO2 29 06/28/2023   BUN 22 06/28/2023   CREATININE 0.98 06/28/2023   GFR 57.21 (L) 06/28/2023  CALCIUM 9.4 06/28/2023   PROT 6.9 06/28/2023   ALBUMIN 4.4 06/28/2023   BILITOT 0.3 06/28/2023   ALKPHOS 106 06/28/2023   AST 18 06/28/2023   ALT 18 06/28/2023   Last lipids Lab Results  Component Value Date   CHOL 230 (H) 06/28/2023   HDL 88.60 06/28/2023   LDLCALC 125 (H) 06/28/2023   LDLDIRECT 119.1 05/16/2010   TRIG 85.0 06/28/2023   CHOLHDL 3 06/28/2023   Last hemoglobin A1c Lab Results  Component Value Date   HGBA1C 5.9 06/28/2023   Last thyroid  functions Lab Results  Component Value Date   TSH 2.08 02/10/2021   Last vitamin D  Lab Results  Component Value Date   VD25OH 41 01/23/2016   Last vitamin B12 and Folate Lab Results  Component Value Date   VITAMINB12 431 09/29/2008   FOLATE >24.1 10/17/2017   IMPRESSION AND PLAN:  #1 chronic daily headaches. She has tension headaches with superimposed medication rebound headaches. Emphasized the need to stop all over-the-counter analgesics. Will do Maxalt  10 mg trial to use sparingly. May have to use a course of prednisone  if this is not significantly helpful in breaking her headache cycle.  #2 Recurrent MDD, GAD.  Stable. Continue Lexapro  at the 10 mg daily dose. Continue Wellbutrin  XL 300 mg a day and lorazepam  on a scheduled basis. New prescriptions were not needed today.  She is going on a trip to [place] with her husband in about a month. She requested a letter stating that I monitor for her medical conditions and prescribe her medications.  I wrote the letter today and printed it for her.  An After Visit Summary was printed and given to the patient.  FOLLOW UP: Return in about 3 weeks (around 10/23/2023) for f/u HAs.  Signed:  Gerlene Hockey, MD           10/02/2023

## 2023-10-02 NOTE — Patient Instructions (Signed)

## 2023-10-03 ENCOUNTER — Telehealth: Payer: Self-pay

## 2023-10-03 ENCOUNTER — Other Ambulatory Visit (HOSPITAL_COMMUNITY): Payer: Self-pay

## 2023-10-03 NOTE — Telephone Encounter (Signed)
 noted

## 2023-10-03 NOTE — Telephone Encounter (Signed)
 Pharmacy Patient Advocate Encounter   Received notification from Physician's Office that prior authorization for Rizatriptan  Benzoate 10MG  tablets is required/requested.   Insurance verification completed.   The patient is insured through [employer] .   Per test claim: PA required; PA submitted to above mentioned insurance via Latent Key/confirmation #/EOC [id number] Status is pending

## 2023-10-03 NOTE — Telephone Encounter (Signed)
 PA request has been Submitted. New Encounter has been or will be created for follow up. For additional info see Pharmacy Prior Auth telephone encounter from 10-03-2023.

## 2023-10-03 NOTE — Telephone Encounter (Signed)
 PLEASE BE ADVISED  CALLED PT AND GOT NOW ANSWER LEFT MESSAGED. HOWEVER I CALLED PHARMACY AND PT PICK UP MEDS NO PA IS NEEDED FOR MEDICATION  Pharmacy Patient Advocate Encounter PER PHARMACIST for RIZAPRIPTAN 10MG  TABLET. Currently a quantity of 10 is a 30 day supply and the co-pay is 10.00 .  NO PA IS NEEDED AT THIS TIME

## 2023-10-03 NOTE — Telephone Encounter (Signed)
 Pharmacy Patient Advocate Encounter  Received notification from [employer] that Prior Authorization for Rizatriptan  Tab 10mg  has been APPROVED from 10/03/2023 to 02/12/2024   PA #/Case ID/Reference #: [id number]

## 2023-10-04 NOTE — Telephone Encounter (Unsigned)
 Copied from CRM [id number]. Topic: Clinical - Prescription Issue >> Oct 03, 2023  2:50 PM [place] A wrote: Reason for CRM: Rizatriptan  10 MG - call with quality limit  call back number [phone]

## 2023-10-24 ENCOUNTER — Encounter: Payer: Self-pay | Admitting: Family Medicine

## 2023-10-24 ENCOUNTER — Ambulatory Visit: Admitting: Family Medicine

## 2023-10-24 VITALS — BP 111/66 | HR 91 | Temp 98.7°F | Ht 61.0 in | Wt 185.0 lb

## 2023-10-24 DIAGNOSIS — G4489 Other headache syndrome: Secondary | ICD-10-CM

## 2023-10-24 DIAGNOSIS — F331 Major depressive disorder, recurrent, moderate: Secondary | ICD-10-CM | POA: Diagnosis not present

## 2023-10-24 DIAGNOSIS — F411 Generalized anxiety disorder: Secondary | ICD-10-CM

## 2023-10-24 DIAGNOSIS — F410 Panic disorder [episodic paroxysmal anxiety] without agoraphobia: Secondary | ICD-10-CM

## 2023-10-24 MED ORDER — ESCITALOPRAM OXALATE 20 MG PO TABS: 20.0000 mg | ORAL_TABLET | Freq: Every day | ORAL | 1 refills | Status: DC

## 2023-10-24 NOTE — Progress Notes (Signed)
 OFFICE VISIT  10/24/2023  CC:  Chief Complaint  Patient presents with   Headache    Follow up; pt experienced dizziness and woozy while taking Maxalt , she only took 2 doses.    Patient is a 73 y.o. female who presents for 3-week follow-up headaches. A/P as of last visit: 1 chronic daily headaches. She has tension headaches with superimposed medication rebound headaches. Emphasized the need to stop all over-the-counter analgesics. Will do Maxalt  10 mg trial to use sparingly. May have to use a course of prednisone  if this is not significantly helpful in breaking her headache cycle.   #2 Recurrent MDD, GAD.  Stable. Continue Lexapro  at the 10 mg daily dose. Continue Wellbutrin  XL 300 mg a day and lorazepam  on a scheduled basis. New prescriptions were not needed today.  INTERIM HX: Headaches are somewhat improved.  She is trying to cope with them better.  She has some days without any headaches. Rizatriptan  caused dizziness and mental clouding. She tried it on 2 occasions and says it did not help anything with the headache.  She continues to struggle with coping with chronic worry and crying spells.  She has dread attacks. Relationship with her son and daughter-in-law continues to be extremely strained and affects her life continuously. She also is extremely worried about leaving her cat at home when she goes on her trip to [place] soon.   Past Medical History:  Diagnosis Date   Acute parotitis 11/2015   hx of:  sialolithiasis suspected by ENT--spont resolved.   Allergy    SEASONAL   Anxiety    Atrophic vaginitis    Cervical cancer screening    PAP normal 2017: GYN to repeat 2020.   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  Much improved at Dr. Theressa f/u 10/2015.   Hiatal hernia    Humerus fracture 12/19/2017   LEFT: mildly displaced comminuted fracture of the proximal left humerus.   Hyperlipidemia    LDL goal = < 140.  No meds as of 01/2021   Iron deficiency anemia Fall 2019   Celiac screen NEG, hemoccults neg.  Pt to get EGD and TCS 12/19/17.  Endoscopies cancelled b/c pt had horrible time w/prep and fell and broke humerus when trying to reach her bathroom quickly--pt declines endosc as of 06/2018.  Labs stable/normal at that time.   Medial meniscus tear 04/2014   R knee   Osteoarthritis    KNEES   Osteoporosis    GYN managing; as of 01/2016 pt is on Evista .  She is intol of oral bisphosph due to GERD.   Other and unspecified ovarian cysts    Prediabetes 2018   A1c 6% 04/2016   Proximal humerus fracture 12/19/2017   Mildly displaced and comminuted proximal left humeral head and neck fracture.   Rib fracture 09/04/2017   Mildly displaced R 6th rib fx   Unilateral subjective nonpulsatile tinnitus without hearing loss, otoscopic finding, neurologic deficit, or head trauma     Past Surgical History:  Procedure Laterality Date   ABDOMINAL SURGERY  2003   EXPLORATORY LAPAROTOMY, LSO   ADENOIDECTOMY  1954  Carotid duplex dopplers Bilateral 11/2020   <50% carotid bulb plaque bilat.   CARPAL TUNNEL RELEASE Bilateral 2001   CATARACT EXTRACTION Left 08/2022   [facility], Dr.Steven [facility]   CATARACT EXTRACTION Right 09/2022   [facility], Dr.Steven [facility]   CHOLECYSTECTOMY  1989   stones   COLONOSCOPY  2011; 03/2013   2011 NORMAL.  03/2013 TCS done for heme+ stool: no polyps.  Repeat 10 yrs (after 03/2023).   DEXA  03/23/2014; 2020   Osteopenia.  Pt took prolia  x 2 injections, then was switched to evista  by her GYN.  DEXA 01/17/16 T score a bit worse, same regimen rec'd, as of 02/2019 doing well on raloxifene , rpt DEXA planned in 1 yr. (by GYN)    ENDOMETRIAL BIOPSY  09/27/2015   PATH:  BENIGN   endoscopy upper GI  2000; 05/2014   negative 2000.  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 Aneita is PPI bid long term.   HYSTEROSCOPY     UTERINE POLYPS   MOUTH SURGERY     OOPHORECTOMY  2003   LSO WITH EXPLORATORY LAPAROTOMY   TUBAL LIGATION  1987    Outpatient Medications Prior to Visit  Medication Sig Dispense Refill   buPROPion  (WELLBUTRIN  XL) 300 MG 24 hr tablet TAKE 1 TABLET BY MOUTH EVERY DAY 90 tablet 0   Calcium Carbonate-Vitamin D  600-400 MG-UNIT per tablet Take 1 tablet by mouth daily.     Cyanocobalamin (VITAMIN B-12 PO) Take by mouth.     LORazepam  (ATIVAN ) 2 MG tablet TAKE 1 TABLET BY MOUTH 3 TIMES A DAY AS NEEDED FOR ANXIETY.  May take an additional tab every 24 hours as needed for extreme anxiety. 105 tablet 5   Magnesium 500 MG CAPS Take 1 capsule by mouth daily.     Multiple Vitamin (MULTIVITAMIN) tablet Take 1 tablet by mouth daily.     Multiple Vitamins-Minerals (ZINC PO) Take by mouth.     raloxifene  (EVISTA ) 60 MG tablet TAKE 1 TABLET BY MOUTH EVERY DAY 90 tablet 4   escitalopram  (LEXAPRO ) 10 MG tablet Take 1 tablet (10 mg total) by mouth daily. 90 tablet 3   omeprazole  (PRILOSEC) 40 MG capsule TAKE 1 CAPSULE BY MOUTH TWICE A DAY (Patient not taking: Reported on 10/24/2023) 60 capsule 1   rizatriptan  (MAXALT ) 10 MG tablet Take 1 tablet (10 mg total) by mouth as needed for migraine. May repeat in 2 hours if needed.  Max 20 mg in 24h. (Patient not taking: Reported on 10/24/2023) 10 tablet 0   No facility-administered medications prior to visit.    No Known Allergies  Review of Systems As per HPI  PE:    10/24/2023   10:19 AM 10/02/2023    9:05 AM 08/14/2023    3:18 PM  Vitals with BMI  Height 5' 1 5' 1 5' 1  Weight 185 lbs 185 lbs 3 oz 184 lbs  BMI 34.97 35.01 34.78  Systolic 111 115   Diastolic 66 68   Pulse 91 82       Physical Exam  Gen: Alert, well appearing.  Patient is oriented to person, place, time, and situation. AFFECT: pleasant, lucid thought and speech. Tearful at times. No further exam today.  LABS:  Last CBC Lab Results  Component Value Date   WBC 6.1 06/28/2023   HGB 12.1 06/28/2023   HCT 37.5 06/28/2023   MCV 84.1  06/28/2023   MCH 28.9 06/27/2018   RDW 15.2 06/28/2023   PLT 179.0 06/28/2023   Last metabolic panel Lab Results  Component Value Date   GLUCOSE 100 (H) 06/28/2023   NA 142 06/28/2023   K 4.6 06/28/2023   CL 105 06/28/2023   CO2 29 06/28/2023   BUN 22 06/28/2023   CREATININE 0.98 06/28/2023   GFR 57.21 (L) 06/28/2023   CALCIUM 9.4 06/28/2023   PROT 6.9 06/28/2023   ALBUMIN 4.4 06/28/2023   BILITOT 0.3 06/28/2023   ALKPHOS 106 06/28/2023   AST 18 06/28/2023   ALT 18 06/28/2023   IMPRESSION AND PLAN:  #1 combined tension and rebound headaches, getting better. She has stopped using over-the-counter analgesics with any regularity. Did not tolerate trial of rizatriptan .  #2 GAD, panic attacks, recurrent major depressive disorder. Increase Lexapro  to 20 mg a day.  Continue Wellbutrin  XL 300 mg a day.  Continue lorazepam  2 mg, 1 tablet 3 times a day and may take an additional tab every 24 hours as needed for extreme anxiety.  An After Visit Summary was printed and given to the patient.  FOLLOW UP: Return in about 4 weeks (around 11/21/2023) for routine chronic illness f/u. Next CPE May 2026 Signed:  Gerlene Hockey, MD           10/24/2023

## 2023-11-20 ENCOUNTER — Ambulatory Visit: Admitting: Family Medicine

## 2023-11-20 ENCOUNTER — Encounter: Payer: Self-pay | Admitting: Family Medicine

## 2023-11-20 VITALS — BP 102/61 | HR 96 | Temp 98.5°F | Ht 61.0 in | Wt 180.6 lb

## 2023-11-20 DIAGNOSIS — F410 Panic disorder [episodic paroxysmal anxiety] without agoraphobia: Secondary | ICD-10-CM

## 2023-11-20 DIAGNOSIS — Z5181 Encounter for therapeutic drug level monitoring: Secondary | ICD-10-CM

## 2023-11-20 DIAGNOSIS — F3341 Major depressive disorder, recurrent, in partial remission: Secondary | ICD-10-CM | POA: Diagnosis not present

## 2023-11-20 DIAGNOSIS — Z79899 Other long term (current) drug therapy: Secondary | ICD-10-CM

## 2023-11-20 DIAGNOSIS — F411 Generalized anxiety disorder: Secondary | ICD-10-CM | POA: Diagnosis not present

## 2023-11-20 DIAGNOSIS — K219 Gastro-esophageal reflux disease without esophagitis: Secondary | ICD-10-CM

## 2023-11-20 MED ORDER — OMEPRAZOLE 40 MG PO CPDR: 40.0000 mg | DELAYED_RELEASE_CAPSULE | Freq: Two times a day (BID) | ORAL | 3 refills | Status: AC

## 2023-11-20 MED ORDER — ESCITALOPRAM OXALATE 20 MG PO TABS: 20.0000 mg | ORAL_TABLET | Freq: Every day | ORAL | 3 refills | Status: AC

## 2023-11-20 NOTE — Progress Notes (Signed)
 OFFICE VISIT  11/20/2023  CC:  Chief Complaint  Patient presents with   Medical Management of Chronic Issues    1 month f/u    Patient is a 73 y.o. female who presents for 1 month follow-up anxiety and depression. A/P as of last visit: #1 combined tension and rebound headaches, getting better. She has stopped using over-the-counter analgesics with any regularity. Did not tolerate trial of rizatriptan .   #2 GAD, panic attacks, recurrent major depressive disorder. Increase Lexapro  to 20 mg a day.  Continue Wellbutrin  XL 300 mg a day.  Continue lorazepam  2 mg, 1 tablet 3 times a day and may take an additional tab every 24 hours as needed for extreme anxiety.  INTERIM HX: Doing better from a anxiety and depression standpoint.  She thinks the Lexapro  increased did help some.  Additionally, she feels like her recent trip to [place] was helpful.  This was a big obstacle/fear that she got over and she feels like her head is clearer.  She takes omeprazole  1-2 times every day.  She has breakthrough reflux symptoms if she does not. Denies dysphagia or odynophagia.  PMP AWARE reviewed today: most recent rx for lorazepam  2 mg was filled 10/21/2023, # 105, rx by me. No red flags.   Past Medical History:  Diagnosis Date   Acute parotitis 11/2015   hx of:  sialolithiasis suspected by ENT--spont resolved.   Allergy    SEASONAL   Anxiety    Atrophic vaginitis    Cervical cancer screening    PAP normal 2017: GYN to repeat 2020.   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  Much improved at Dr. Theressa f/u 10/2015.   Hiatal hernia    Humerus fracture 12/19/2017   LEFT: mildly displaced comminuted  fracture of the proximal left humerus.   Hyperlipidemia    LDL goal = < 140.  No meds as of 01/2021   Iron deficiency anemia Fall 2019   Celiac screen NEG, hemoccults neg.  Pt to get EGD and TCS 12/19/17.  Endoscopies cancelled b/c pt had horrible time w/prep and fell and broke humerus when trying to reach her bathroom quickly--pt declines endosc as of 06/2018.  Labs stable/normal at that time.   Medial meniscus tear 04/2014   R knee   Osteoarthritis    KNEES   Osteoporosis    GYN managing; as of 01/2016 pt is on Evista .  She is intol of oral bisphosph due to GERD.   Other and unspecified ovarian cysts    Prediabetes 2018   A1c 6% 04/2016   Proximal humerus fracture 12/19/2017   Mildly displaced and comminuted proximal left humeral head and neck fracture.   Rib fracture 09/04/2017   Mildly displaced R 6th rib fx   Unilateral subjective nonpulsatile tinnitus without hearing loss, otoscopic finding, neurologic deficit, or head trauma     Past Surgical History:  Procedure Laterality Date   ABDOMINAL SURGERY  2003   EXPLORATORY LAPAROTOMY, LSO   ADENOIDECTOMY  1954   Carotid duplex dopplers Bilateral 11/2020   <50% carotid bulb plaque bilat.   CARPAL TUNNEL RELEASE Bilateral 2001  CATARACT EXTRACTION Left 08/2022   [facility], Dr.Steven [facility]   CATARACT EXTRACTION Right 09/2022   [facility], Dr.Steven [facility]   CHOLECYSTECTOMY  1989   stones   COLONOSCOPY  2011; 03/2013   2011 NORMAL.  03/2013 TCS done for heme+ stool: no polyps.  Repeat 10 yrs (after 03/2023).   DEXA  03/23/2014; 2020   Osteopenia.  Pt took prolia  x 2 injections, then was switched to evista  by her GYN.  DEXA 01/17/16 T score a bit worse, same regimen rec'd, as of 02/2019 doing well on raloxifene , rpt DEXA planned in 1 yr. (by GYN)   ENDOMETRIAL BIOPSY  09/27/2015   PATH:  BENIGN   endoscopy upper GI  2000; 05/2014   negative 2000.  In 2016 she had irreg z-line biopsied and was noted to have acquired  stenosis of 2nd part of duodenum--plan per Dr. Aneita is PPI bid long term.   HYSTEROSCOPY     UTERINE POLYPS   MOUTH SURGERY     OOPHORECTOMY  2003   LSO WITH EXPLORATORY LAPAROTOMY   TUBAL LIGATION  1987    Outpatient Medications Prior to Visit  Medication Sig Dispense Refill   buPROPion  (WELLBUTRIN  XL) 300 MG 24 hr tablet TAKE 1 TABLET BY MOUTH EVERY DAY 90 tablet 0   Calcium Carbonate-Vitamin D  600-400 MG-UNIT per tablet Take 1 tablet by mouth daily.     Cyanocobalamin (VITAMIN B-12 PO) Take by mouth.     LORazepam  (ATIVAN ) 2 MG tablet TAKE 1 TABLET BY MOUTH 3 TIMES A DAY AS NEEDED FOR ANXIETY.  May take an additional tab every 24 hours as needed for extreme anxiety. 105 tablet 5   Magnesium 500 MG CAPS Take 1 capsule by mouth daily.     Multiple Vitamin (MULTIVITAMIN) tablet Take 1 tablet by mouth daily.     Multiple Vitamins-Minerals (ZINC PO) Take by mouth.     raloxifene  (EVISTA ) 60 MG tablet TAKE 1 TABLET BY MOUTH EVERY DAY 90 tablet 4   escitalopram  (LEXAPRO ) 20 MG tablet Take 1 tablet (20 mg total) by mouth daily. 90 tablet 1   omeprazole  (PRILOSEC) 40 MG capsule TAKE 1 CAPSULE BY MOUTH TWICE A DAY 60 capsule 1   rizatriptan  (MAXALT ) 10 MG tablet Take 1 tablet (10 mg total) by mouth as needed for migraine. May repeat in 2 hours if needed.  Max 20 mg in 24h. (Patient not taking: Reported on 11/20/2023) 10 tablet 0   No facility-administered medications prior to visit.    No Known Allergies  Review of Systems As per HPI  PE:    11/20/2023   10:43 AM 10/24/2023   10:19 AM 10/02/2023    9:05 AM  Vitals with BMI  Height 5' 1 5' 1 5' 1  Weight 180 lbs 10 oz 185 lbs 185 lbs 3 oz  BMI 34.14 34.97 35.01  Systolic 102 111 884  Diastolic 61 66 68  Pulse 96 91 82     Physical Exam  Gen: Alert, well appearing.  Patient is oriented to person, place, time, and situation. AFFECT: pleasant, lucid thought and speech. No further exam today  LABS:    Chemistry       Component Value Date/Time   NA 142 06/28/2023 0819   NA 141 10/08/2017 0000   K 4.6 06/28/2023 0819   CL 105 06/28/2023 0819   CO2 29 06/28/2023 0819   BUN 22 06/28/2023 0819   BUN 19 10/08/2017 0000   CREATININE  0.98 06/28/2023 0819   CREATININE 0.75 06/27/2018 1430   GLU 94 10/08/2017 0000      Component Value Date/Time   CALCIUM 9.4 06/28/2023 0819   ALKPHOS 106 06/28/2023 0819   AST 18 06/28/2023 0819   ALT 18 06/28/2023 0819   BILITOT 0.3 06/28/2023 0819     Lab Results  Component Value Date   VITAMINB12 431 09/29/2008   IMPRESSION AND PLAN:  #1 GAD, panic attacks, recurrent major depressive disorder. Improved since recent increase in Lexapro  to 20 mg a day.  Continue Wellbutrin  XL 300 mg a day.  Continue lorazepam  2 mg, 1 tablet 3 times a day and may take an additional tab every 24 hours as needed for extreme anxiety.  #2 GERD. Long-term PPI use has been required.  Refilled omeprazole  40 mg, 1 tab daily to twice daily, #180, 3 additional refills. Renal function has consistently been normal, most recently about 5 months ago. She has no problem with GI infections. We will recheck vitamin B12 level at her next physical in May 2026.  An After Visit Summary was printed and given to the patient.  FOLLOW UP: Return in about 3 months (around 02/20/2024) for routine chronic illness f/u. Next CPE 06/2024 Signed:  Gerlene Hockey, MD           11/20/2023

## 2023-12-23 ENCOUNTER — Other Ambulatory Visit: Payer: Self-pay | Admitting: Family Medicine

## 2023-12-23 NOTE — Telephone Encounter (Signed)
 Requesting: lorazepam  Contract: 11/23/22 UDS: 01/25/23 Last Visit: 1 0/8/25 Next Visit: 02/20/24 Last Refill: 06/14/23 (105,5)  Please Advise. Rx pending

## 2023-12-28 ENCOUNTER — Other Ambulatory Visit: Payer: Self-pay | Admitting: Family Medicine

## 2024-01-15 LAB — HM MAMMOGRAPHY

## 2024-01-27 ENCOUNTER — Telehealth: Payer: Self-pay

## 2024-01-27 NOTE — Telephone Encounter (Signed)
 FYI. Please see below.  Spoke with pharmacy tech; pt picked up last refill on 11/10 for qty 105, insurance will not allow refill until 03/2024.

## 2024-01-27 NOTE — Telephone Encounter (Signed)
(  Per PDMP website she last filled her lorazepam  on 12/23/2023, #105 tabs.  This is her usual monthly amount.  This is not an early RF request)   OK, her insurance apparently has a quantity limit. She will have to pay out of pocket for her lorazepam  until Feb.

## 2024-01-27 NOTE — Telephone Encounter (Signed)
 Copied from CRM #[id number]. Topic: Clinical - Prescription Issue >> Jan 27, 2024  2:47 PM Franky GRADE wrote: Reason for CRM: Patient is calling because she tried to fill her prescription for LORazepam  (ATIVAN ) 2 MG tablet [[id number]]; however pharmacy informed patient they were unable to fill until February 2026, she isn't completely out and has enough to hold her over for a couple a days.

## 2024-01-28 NOTE — Telephone Encounter (Signed)
 Spoke with pt, she wanted to know if she can get a lower qty amount to hold her over until February. Unsure at this time, advised I would call the pharmacy and see what our options are and let her know.

## 2024-01-28 NOTE — Telephone Encounter (Signed)
 Spoke with pharmacist, they was an issue that occurred on their end that messed up the prescription class.  The error has been corrected and the refill can be processed.  Pt was advised of update for refill, she will contact the pharmacy if any other questions pertaining to rx.

## 2024-02-20 ENCOUNTER — Encounter: Payer: Self-pay | Admitting: Family Medicine

## 2024-02-20 ENCOUNTER — Ambulatory Visit: Admitting: Family Medicine

## 2024-02-20 VITALS — BP 112/69 | HR 96 | Temp 97.3°F | Ht 61.0 in | Wt 183.4 lb

## 2024-02-20 DIAGNOSIS — Z79899 Other long term (current) drug therapy: Secondary | ICD-10-CM | POA: Diagnosis not present

## 2024-02-20 DIAGNOSIS — M25571 Pain in right ankle and joints of right foot: Secondary | ICD-10-CM | POA: Diagnosis not present

## 2024-02-20 DIAGNOSIS — F4323 Adjustment disorder with mixed anxiety and depressed mood: Secondary | ICD-10-CM

## 2024-02-20 DIAGNOSIS — S86301A Unspecified injury of muscle(s) and tendon(s) of peroneal muscle group at lower leg level, right leg, initial encounter: Secondary | ICD-10-CM | POA: Diagnosis not present

## 2024-02-20 DIAGNOSIS — G8929 Other chronic pain: Secondary | ICD-10-CM

## 2024-02-20 DIAGNOSIS — F411 Generalized anxiety disorder: Secondary | ICD-10-CM | POA: Diagnosis not present

## 2024-02-20 MED ORDER — BUPROPION HCL ER (XL) 300 MG PO TB24: 300.0000 mg | ORAL_TABLET | Freq: Every day | ORAL | 3 refills | Status: AC

## 2024-02-20 NOTE — Progress Notes (Signed)
 OFFICE VISIT  02/20/2024  CC:  Chief Complaint  Patient presents with   Medical Management of Chronic Issues    Patient is a 74 y.o. female who presents for 86-month follow-up anxiety and depression. A/P as of last visit: #1 GAD, panic attacks, recurrent major depressive disorder. Improved since recent increase in Lexapro  to 20 mg a day.  Continue Wellbutrin  XL 300 mg a day.  Continue lorazepam  2 mg, 1 tablet 3 times a day and may take an additional tab every 24 hours as needed for extreme anxiety.   #2 GERD. Long-term PPI use has been required.  Refilled omeprazole  40 mg, 1 tab daily to twice daily, #180, 3 additional refills. Renal function has consistently been normal, most recently about 5 months ago. She has no problem with GI infections. We will recheck vitamin B12 level at her next physical in May 2026.  PMP AWARE reviewed today: most recent rx for lorazepam  2 mg was filled 01/28/2024, # 105, rx by me. No red flags.   INTERIM HX: Overall her family issues are unchanged. Estranged son and his family are still the primary stressor.  The big thing lately is that her cat died unexpectedly.  This caused significant grief, as appropriate.  After a brief waiting period she did get another cat recently.  Also, she has had intermittent severe pain in the right ankle lateral malleolus region.  Some days then hurt at all but other days she has pain even without bearing weight and when bearing weight it is severe. She has not seen any swelling or redness of the ankle.  She recalls no preceding injury. No other joints are bothering her. She has tried some NSAIDs but no help.  Past Medical History:  Diagnosis Date   Acute parotitis 11/2015   hx of:  sialolithiasis suspected by ENT--spont resolved.   Allergy    SEASONAL   Anxiety    Atrophic vaginitis    Cervical cancer screening    PAP normal 2017: GYN to repeat 2020.   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  Much improved at Dr. Theressa f/u 10/2015.   Hiatal hernia    Humerus fracture 12/19/2017   LEFT: mildly displaced comminuted fracture of the proximal left humerus.   Hyperlipidemia    LDL goal = < 140.  No meds as of 01/2021   Iron deficiency anemia Fall 2019   Celiac screen NEG, hemoccults neg.  Pt to get EGD and TCS 12/19/17.  Endoscopies cancelled b/c pt had horrible time w/prep and fell and broke humerus when trying to reach her bathroom quickly--pt declines endosc as of 06/2018.  Labs stable/normal at that time.   Medial meniscus tear 04/2014   R knee   Osteoarthritis    KNEES   Osteoporosis    GYN managing; as of 01/2016 pt is on Evista .  She is intol of oral bisphosph due to GERD.   Other and unspecified ovarian cysts    Prediabetes 2018   A1c 6% 04/2016   Proximal humerus fracture 12/19/2017   Mildly displaced and comminuted proximal left humeral head and neck fracture.   Rib  fracture 09/04/2017   Mildly displaced R 6th rib fx   Unilateral subjective nonpulsatile tinnitus without hearing loss, otoscopic finding, neurologic deficit, or head trauma     Past Surgical History:  Procedure Laterality Date   ABDOMINAL SURGERY  2003   EXPLORATORY LAPAROTOMY, LSO   ADENOIDECTOMY  1954   Carotid duplex dopplers Bilateral 11/2020   <50% carotid bulb plaque bilat.   CARPAL TUNNEL RELEASE Bilateral 2001   CATARACT EXTRACTION Left 08/2022   [facility], Dr[facility]   CATARACT EXTRACTION Right 09/2022   [facility], Dr.[facility]   CHOLECYSTECTOMY  1989   stones   COLONOSCOPY  2011; 03/2013   2011 NORMAL.  03/2013 TCS done for heme+ stool: no polyps.  Repeat 10 yrs (after 03/2023).   DEXA   03/23/2014; 2020   Osteopenia.  Pt took prolia  x 2 injections, then was switched to evista  by her GYN.  DEXA 01/17/16 T score a bit worse, same regimen rec'd, as of 02/2019 doing well on raloxifene , rpt DEXA planned in 1 yr. (by GYN)   ENDOMETRIAL BIOPSY  09/27/2015   PATH:  BENIGN   endoscopy upper GI  2000; 05/2014   negative 2000.  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 Aneita is PPI bid long term.   HYSTEROSCOPY     UTERINE POLYPS   MOUTH SURGERY     OOPHORECTOMY  2003   LSO WITH EXPLORATORY LAPAROTOMY   TUBAL LIGATION  1987    Outpatient Medications Prior to Visit  Medication Sig Dispense Refill   Calcium Carbonate-Vitamin D  600-400 MG-UNIT per tablet Take 1 tablet by mouth daily.     Cyanocobalamin (VITAMIN B-12 PO) Take by mouth.     escitalopram  (LEXAPRO ) 20 MG tablet Take 1 tablet (20 mg total) by mouth daily. 90 tablet 3   LORazepam  (ATIVAN ) 2 MG tablet TAKE 1 TABLET BY MOUTH 3 TIMES A DAY AS NEEDED FOR ANXIETY. MAY TAKE AN ADDITIONAL TAB EVERY 24 HOURS AS NEEDED FOR EXTREME ANXIETY. 105 tablet 5   Magnesium 500 MG CAPS Take 1 capsule by mouth daily.     Multiple Vitamin (MULTIVITAMIN) tablet Take 1 tablet by mouth daily.     Multiple Vitamins-Minerals (ZINC PO) Take by mouth.     omeprazole  (PRILOSEC) 40 MG capsule Take 1 capsule (40 mg total) by mouth 2 (two) times daily. 180 capsule 3   buPROPion  (WELLBUTRIN  XL) 300 MG 24 hr tablet TAKE 1 TABLET BY MOUTH EVERY DAY 90 tablet 0   raloxifene  (EVISTA ) 60 MG tablet TAKE 1 TABLET BY MOUTH EVERY DAY (Patient not taking: Reported on 02/20/2024) 90 tablet 4   rizatriptan  (MAXALT ) 10 MG tablet Take 1 tablet (10 mg total) by mouth as needed for migraine. May repeat in 2 hours if needed.  Max 20 mg in 24h. (Patient not taking: Reported on 02/20/2024) 10 tablet 0   No facility-administered medications prior to visit.    Allergies[1]  Review of Systems As per  HPI  PE:    02/20/2024   10:25 AM 11/20/2023   10:43 AM 10/24/2023   10:19 AM  Vitals with BMI  Height 5' 1 5' 1 5' 1  Weight 183 lbs 6 oz 180 lbs 10 oz 185 lbs  BMI 34.67 34.14 34.97  Systolic 112 102 888  Diastolic 69 61 66  Pulse 96 96 91     Physical Exam  General:  Alert and well-appearing. Affect is pleasant.  She did break down crying when talking about her cat. Thought and speech are lucid. Lower extremities without edema.  She has a slight prominence of the fat pad region around the lateral malleolus of the right ankle.  No erythema or warmth in the region. She has a bit of focal tenderness to firm palpation over the peroneal tendons as they coursed behind the lateral malleolus and a bit more distally.  Not much discomfort with resisted foot eversion and ankle dorsiflexion.  Strength intact. No further exam today.  LABS:  Last metabolic panel Lab Results  Component Value Date   GLUCOSE 100 (H) 06/28/2023   NA 142 06/28/2023   K 4.6 06/28/2023   CL 105 06/28/2023   CO2 29 06/28/2023   BUN 22 06/28/2023   CREATININE 0.98 06/28/2023   GFR 57.21 (L) 06/28/2023   CALCIUM 9.4 06/28/2023   PROT 6.9 06/28/2023   ALBUMIN 4.4 06/28/2023   BILITOT 0.3 06/28/2023   ALKPHOS 106 06/28/2023   AST 18 06/28/2023   ALT 18 06/28/2023   IMPRESSION AND PLAN:  1 GAD, panic attacks, recurrent major depressive disorder. She had a recent bump in the road with adjustment disorder with mixed anxious and depressed mood due to the unexpected death of her cat.  Continue Lexapro  to 20 mg a day.  Continue Wellbutrin  XL 300 mg a day.  Continue lorazepam  2 mg, 1 tablet 3 times a day and may take an additional tab every 24 hours as needed for extreme anxiety.  At next follow-up in 4 months we will do controlled substance contract update and will do urine drug screen at that time as well.  #2 peroneal tendinitis right ankle. Bedside [facility] ultrasound today showed intact peroneus longus and  brevis tendons in short and long views, with some mild excess anechoic fluid in the sheath in the area of her tenderness.  No ankle effusion. Recommended walking boot--> prescription given today. She will wear this for the majority of her walking time over the next week and then gradually wean off of this. Signs/symptoms to call or return for were reviewed and pt expressed understanding. If symptoms persist despite this treatment or if they worsen we will get radiographs of the right ankle and foot and possibly refer her to orthopedics.  An After Visit Summary was printed and given to the patient.  FOLLOW UP: Return in about 4 months (around 06/19/2024) for annual CPE (fasting). Next CPE 06/2024 Signed:  Gerlene Hockey, MD           02/20/2024      [1] No Known Allergies

## 2024-03-17 ENCOUNTER — Ambulatory Visit: Payer: Self-pay

## 2024-03-17 NOTE — Telephone Encounter (Signed)
 Called [facility], no answer.

## 2024-03-19 ENCOUNTER — Ambulatory Visit: Payer: Self-pay | Admitting: *Deleted

## 2024-03-19 NOTE — Telephone Encounter (Signed)
 She should only wean 1 at a time. Take one half of the Lexapro  20 mg tab daily for 5 days then okay to stop it. I want her to continue Wellbutrin  XL 300 mg daily for now.   Keep appointment set for 03/30/2024.

## 2024-03-19 NOTE — Telephone Encounter (Signed)
 Please Advise

## 2024-03-19 NOTE — Telephone Encounter (Signed)
 Patient requesting to wean wellbutrin  and lexapro  prior to OV 03/30/24. Please advise. See NT encounter.      FYI Only or Action Required?: Action required by provider: clinical question for provider, update on patient condition, and appt scheduled 03/30/24.  Patient was last seen in primary care on 02/20/2024 by McGowen, Aleene DEL, MD.  Called Nurse Triage reporting Depression.  Symptoms began since starting medications for depression.  Interventions attempted: Prescription medications: wellbutrin  XL, lexapro .  Symptoms are: gradually worsening.  Triage Disposition: See PCP When Office is Open (Within 3 Days)  Patient/caregiver understands and will follow disposition?: Yes                 Reason for Disposition  [1] New or changed anti-depressant medication > 2 weeks ago AND [2] not feeling any better  Answer Assessment - Initial Assessment Questions Offered appt with other provider tomorrow. Patient reports she can not make appt tomorrow and Monday. Requesting to see PCP and discuss weaning off current medications wellbutrin  XL 300mg  and lexapro  20 mg. Patient requesting to keep lorazepam  prn if needed. Appt scheduled with PCP 03/30/24. Recommended if sx worsen call back or go to UC or ED if needed. Reviewed resource to text [phone] if needed. Please advise if patient can wean medication prior to next OV .        1. CONCERN: What happened that made you call today?     Called Tuesday with crying episodes not feeling normal feelings and requesting to wean off of medications 2. DEPRESSION SYMPTOM SCREENING: How are you feeling overall? (e.g., decreased energy, increased sleeping or difficulty sleeping, difficulty concentrating, feelings of sadness, guilt, hopelessness, or worthlessness)     Low energy , sleeping more, feelings of hopeless 3. RISK OF HARM - SUICIDAL IDEATION:  Do you ever have thoughts of hurting or killing yourself?  (e.g., yes, no, no but preoccupation  with thoughts about death)     No  4. RISK OF HARM - HOMICIDAL IDEATION:  Do you ever have thoughts of hurting or killing someone else?  (e.g., yes, no, no but preoccupation with thoughts about death)     No  5. FUNCTIONAL IMPAIRMENT: How have things been going for you overall? Have you had more difficulty than usual doing your normal daily activities?  (e.g., better, same, worse; self-care, school, work, interactions)     Overall ok but feelings of crying episodes noted since taking medications 6. SUPPORT: Who is with you now? Who do you live with? Do you have family or friends who you can talk to?      Husband and sister  47. THERAPIST: Do you have a counselor or therapist? If Yes, ask: What is their name?     na 8. STRESSORS: Has there been any new stress or recent changes in your life?     no 9. ALCOHOL USE OR SUBSTANCE USE (DRUG USE): Do you drink alcohol or use any illegal drugs?     na 10. OTHER: Do you have any other physical symptoms right now? (e.g., fever)       Feels anxious and breathing harder at times, not now. Crying episodes and feels it is related to medications. Low energy . Hopeless at times.  Protocols used: Depression-A-AH

## 2024-03-20 NOTE — Telephone Encounter (Signed)
 Spoke with pt, advised of provider recommendations. Pt voiced understanding

## 2024-03-30 ENCOUNTER — Ambulatory Visit: Admitting: Family Medicine

## 2024-07-09 ENCOUNTER — Encounter: Admitting: Family Medicine

## 2024-08-19 ENCOUNTER — Encounter
